# Patient Record
Sex: Male | Born: 1955 | Race: White | Hispanic: No | Marital: Married | Smoking: Current some day smoker
Health system: Southern US, Community
[De-identification: ages and names within clinical notes are randomized; demographics above are authoritative.]

## PROBLEM LIST (undated history)

## (undated) DIAGNOSIS — I319 Disease of pericardium, unspecified: Secondary | ICD-10-CM

## (undated) DIAGNOSIS — I4819 Other persistent atrial fibrillation: Secondary | ICD-10-CM

## (undated) DIAGNOSIS — T8859XA Other complications of anesthesia, initial encounter: Secondary | ICD-10-CM

## (undated) DIAGNOSIS — I499 Cardiac arrhythmia, unspecified: Secondary | ICD-10-CM

## (undated) HISTORY — PX: FOOT SURGERY: SHX648

## (undated) HISTORY — PX: KNEE SURGERY: SHX244

## (undated) HISTORY — PX: BACK SURGERY: SHX140

---
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
## 1999-08-08 ENCOUNTER — Emergency Department (HOSPITAL_COMMUNITY): Admission: EM | Admit: 1999-08-08 | Discharge: 1999-08-08 | Payer: Self-pay | Admitting: Emergency Medicine

## 1999-08-08 ENCOUNTER — Encounter: Payer: Self-pay | Admitting: Emergency Medicine

## 1999-08-09 ENCOUNTER — Encounter: Payer: Self-pay | Admitting: Orthopedic Surgery

## 1999-08-09 ENCOUNTER — Encounter: Admission: RE | Admit: 1999-08-09 | Discharge: 1999-08-09 | Payer: Self-pay | Admitting: Orthopedic Surgery

## 2000-06-06 ENCOUNTER — Encounter: Admission: RE | Admit: 2000-06-06 | Discharge: 2000-06-06 | Payer: Self-pay | Admitting: Emergency Medicine

## 2000-06-06 ENCOUNTER — Encounter: Payer: Self-pay | Admitting: Emergency Medicine

## 2000-06-13 ENCOUNTER — Encounter: Admission: RE | Admit: 2000-06-13 | Discharge: 2000-06-13 | Payer: Self-pay | Admitting: Emergency Medicine

## 2000-06-13 ENCOUNTER — Encounter: Payer: Self-pay | Admitting: Emergency Medicine

## 2000-08-15 ENCOUNTER — Encounter: Payer: Self-pay | Admitting: Neurosurgery

## 2000-08-19 ENCOUNTER — Encounter: Payer: Self-pay | Admitting: Neurosurgery

## 2000-08-19 ENCOUNTER — Inpatient Hospital Stay (HOSPITAL_COMMUNITY): Admission: RE | Admit: 2000-08-19 | Discharge: 2000-08-23 | Payer: Self-pay | Admitting: Neurosurgery

## 2000-10-09 ENCOUNTER — Ambulatory Visit (HOSPITAL_COMMUNITY): Admission: RE | Admit: 2000-10-09 | Discharge: 2000-10-09 | Payer: Self-pay | Admitting: Neurosurgery

## 2000-10-09 ENCOUNTER — Encounter: Payer: Self-pay | Admitting: Neurosurgery

## 2001-02-09 ENCOUNTER — Encounter: Payer: Self-pay | Admitting: Neurosurgery

## 2001-02-09 ENCOUNTER — Ambulatory Visit (HOSPITAL_COMMUNITY): Admission: RE | Admit: 2001-02-09 | Discharge: 2001-02-09 | Payer: Self-pay | Admitting: Neurosurgery

## 2002-03-19 ENCOUNTER — Ambulatory Visit (HOSPITAL_COMMUNITY): Admission: RE | Admit: 2002-03-19 | Discharge: 2002-03-19 | Payer: Self-pay | Admitting: Neurosurgery

## 2002-03-19 ENCOUNTER — Encounter: Payer: Self-pay | Admitting: Neurosurgery

## 2002-04-20 ENCOUNTER — Encounter: Payer: Self-pay | Admitting: Neurosurgery

## 2002-04-20 ENCOUNTER — Encounter: Admission: RE | Admit: 2002-04-20 | Discharge: 2002-04-20 | Payer: Self-pay | Admitting: Neurosurgery

## 2003-05-09 ENCOUNTER — Inpatient Hospital Stay (HOSPITAL_COMMUNITY): Admission: RE | Admit: 2003-05-09 | Discharge: 2003-05-12 | Payer: Self-pay | Admitting: Neurosurgery

## 2003-11-08 ENCOUNTER — Encounter: Admission: RE | Admit: 2003-11-08 | Discharge: 2003-11-08 | Payer: Self-pay | Admitting: Neurosurgery

## 2006-12-10 ENCOUNTER — Emergency Department: Payer: Self-pay | Admitting: Emergency Medicine

## 2006-12-10 ENCOUNTER — Other Ambulatory Visit: Payer: Self-pay

## 2009-06-23 ENCOUNTER — Ambulatory Visit (HOSPITAL_BASED_OUTPATIENT_CLINIC_OR_DEPARTMENT_OTHER): Admission: RE | Admit: 2009-06-23 | Discharge: 2009-06-23 | Payer: Self-pay | Admitting: Urology

## 2010-10-08 LAB — POCT HEMOGLOBIN-HEMACUE: Hemoglobin: 14.5 g/dL (ref 13.0–17.0)

## 2010-11-23 NOTE — Op Note (Signed)
Dylan Olsen, Dylan Olsen                        ACCOUNT NO.:  [id number]   MEDICAL RECORD NO.:  [id number]                   PATIENT TYPE:  INP   LOCATION:  2895                                 FACILITY:  [facility]   PHYSICIAN:  [name], M.D.                     DATE OF BIRTH:  1955-07-26   DATE OF PROCEDURE:  05/09/2003  DATE OF DISCHARGE:                                 OPERATIVE REPORT   PREOPERATIVE DIAGNOSES:  1. Severe degenerative disk disease and lumbar spinal stenosis from central     ruptured disk.  2. Facet arthropathy and degeneration at L5 and S1.   POSTOPERATIVE DIAGNOSIS:  1. Severe degenerative disk disease and lumbar spinal stenosis from central     ruptured disk.  2. Facet arthropathy and degeneration at L5 and S1.   OPERATION PERFORMED:  1. Decompressive lumbar laminectomy and posterior lumbar interbody fusion L5-     S1 using locally harvested autograft and 10 x 24 mm tangent allograft     wedges.  2. Pedicle screw fixation, L4 to S1.  3. Removal of hardware from L4-5 fusion with repositioning of the right L5     screw.  4. Placement of __________  drains.  5. Posterolateral arthrodesis using locally harvested autograft, L5-S1.   SURGEON:  [name], M.D.   ASSISTANT:  Kathaleen Maser. Pool, M.D.   ANESTHESIA:  General endotracheal.   INDICATIONS FOR PROCEDURE:  Patient is a very pleasant, 55 year old  gentleman who has had progressive worsening back pain and right  leg  radiculopathy going on for the past several months.  The patient had had a 4-  5 interbody fusion done over two years ago and did very well.  Over the last  several months, he had progressively worsening and tearing back pain with  right leg radiculopathy radiating down what appeared to be an S1  distribution.  The patient had failed conservative treatment with physical  therapy, steroid injections and pain management.  Repeat preoperative  imaging with MRI and diskography showed severe  degenerative disk disease at  L5-S1 with a large central disk bulge and spinal stenosis as well as  concordant reproduction of the patient's symptomatology with the L5-S1 disk  injection.  In addition, on the CT scan, the diskogram showed the L5 pedicle  screw had broken through the cortex of L5 on the right and was in the soft  tissue.  The patient was recommended extending his fusion down to include L5-  S1 with decompressive laminectomy at this level and repositioning of the  right L5 pedicle screw with a shorter screw.  The risks and benefits of  surgery were explained, and the patient understands and agreed to proceed  forward.   DESCRIPTION OF PROCEDURE:  The patient was brought to the OR.  He was given  general anesthesia.  His back was prepped and draped in the  usual sterile  fashion.  His old incision was opened up and extended slightly inferiorly.  Bovie electrocautery was used to dissect through the subcutaneous tissue and  scar tissue exposing the old construct at L4-5 which was a Science writer 3-D  construct.  Then, the connectors were all removed exposing the distal  pedicle screws at L4 and L5 bilaterally.  Then the S1 lamina was identified  and the facet complex of L5-S1 was identified and dissection was carried  laterally exposing the lateral aspect of the S1 sacral ala.  Then, the self-  retaining retractor was placed.  The scar tissue was dissected off of the L5  lamina and the L5 membrane was identified.  Then, using the 3 and 4 mm  Kerrison punch, a radical decompressive laminectomy and complete medial  facetectomy was performed at L5-S1 with redo foraminotomy at the L5 nerve  root and extension of this distally radically decompressing the L5 neural  foramina bilaterally.  Then, the S1 nerve root was identified and this was  radically decompressed in its foramen.  The undersurface of the facet  complex was underbitten exposing the interspace.  Epidural veins were  coagulated.   Then, the D'Errico nerve root retractor was used to reflect the  right S1 nerve root medially.  Disk space was incised and radically cleaned  out, and a size 10 distractor was inserted, fluoroscopy confirming this to  be the proper size and distraction at this level.  Then the D'Errico nerve  root retractor was used to reflect the S1 nerve root medially.  Upon  reflection, retraction of the S1 nerve root and coagulation of the epidural  veins, the patient was noted to have a episodic period of asystole.  This  immediately came back up into the 50s and 60s.  It was felt to be due to  vagal response and retraction was immediately released.  The patient's heart  rate rebounded. He was given some Robinul and remained stable throughout the  rest of the case.  Then gentle retraction was continued to the S1 nerve root  on the left.  The disk spaces were adequately filmed out and there was a  large central disk rupture that was causing severe spinal stenosis on the  medial aspect of the thecal sac.  This was all scraped down with downgoing  Epstein curet and upbiting pituitaries.  At the end of diskectomy, there was  no further stenosis appreciated on thecal sac or either S1 nerve root.  Then, a size 10 cutter and chisel were used to prepare the interspace to  receive the bone graft.  A 10 x 22 mm  tangent allograft was selected and  inserted on the left approximately 2 mm deep to the posterior vertebral  line.  Then, the right S1 nerve root was reflected medially.  Disk space was  again cleaned out.  Epstein was used to remove any residual central disk  rupture.  Then the size 10 cutter and chisel were used to prepare the end  plates on the right side.  Locally harvested autograft was packed against  the allograft on the left, and a 10 x 22 mm tangent allograft was inserted  on the right side.  Both allografts were noted to be in good position with fluoroscopy and direct inspection.  Then,  attention was taken to the pedicle  screw placement.  First, the right L5 pedicle screw was removed.  This was a  6.5 x 45 pedicle screw.  It was replaced with a 7.5 x 40.  The screw had  excellent purchase.  The pedicle was probed prior to screw placement and  noted to be competent.  __________  orientation; it had broken through deep.  Fluoroscopy again confirmed trajectory and the S1 pedicles were identified.  Pilot hole was drilled with a high speed drill, cannulated with the awl,  tapped with the 5.5 tap and 6.5 x 35 pedicle screw inserted at S1  bilaterally.  Direct intercanalicular inspection confirmed no medial breach.  Fluoroscopy confirmed trajectory, and the pedicle was probed from within and  noted to be competent __________  orientation.  After all pedicle screws  were placed, the lateral facet complexes at L5-S1 and transverse processes  were aggressively decorticated.  The remainder of the locally harvested  allograft was packed into the lateral gutters.  Then, using the side-loading  system of the [facility] 3-D, the 6-mm rod was sized, selected.  The connectors  were attached and this was slid down over the post and attached to the top  of the pedicle screws.  These were all seated in appropriate position.  Facet screws were tightened down to hold it in place.  This was done  bilaterally.  The L5 pedicle screw was tightened down with a torque wrench  and the connector popped off.  The S1 pedicle screw was compressed against  L5 and the L4 pedicle screw was tightened down and torqued off.  The neural  foramina were re-explored.  Both L5 and S1 nerve roots were noted to be  widely decompressed.  No further stenosis, no graft material was within the  canal.  Gelfoam was overlaid atop of the dura.  Postoperative fluoroscopy  confirmed good position of screws and rods and then the medium Hemovac drain  was placed. Muscle and fascia reapproximated with 0 interrupted Vicryl,   subcutaneous tissue closed with 2-0 interrupted Vicryl.  Skin was closed  with running 4-0 subcuticular.  Benzoin and Steri-Strips applied.  The  patient was then transferred to the recovery room in stable condition.  At  the end of the case, sponge, needle and instrument counts were correct.                                               [name], M.D.    GC/MEDQ  D:  05/09/2003  T:  05/09/2003  Job:  [id number]

## 2010-11-23 NOTE — Discharge Summary (Signed)
[name], Dylan                        ACCOUNT NO.:  [id number]   MEDICAL RECORD NO.:  [id number]                   PATIENT TYPE:  INP   LOCATION:  3039                                 FACILITY:  [facility]   PHYSICIAN:  [name], M.D.                     DATE OF BIRTH:  08-21-1955   DATE OF ADMISSION:  05/09/2003  DATE OF DISCHARGE:  05/12/2003                                 DISCHARGE SUMMARY   HOSPITAL COURSE:  The patient is a very pleasant 55 year old gentleman with  chronic low back pain who had undergone previous L4-5 posterior lumbar inner  body fusion two years prior. He basically presented with worsened back pain  and lower extremity radiculopathy. Previously showed severe degeneration and  breakdown of the L5-S1 disk space with large central disk bulge. The patient  was recommended extending his fusion down to include the L5-S1 disk space  for radical decompression. The patient was explained the risks and benefits  of this. He understands and agrees to proceed forward. The patient was  admitted. He went to the operating room and underwent the aforementioned  procedure. Postoperatively, the patient did very well, went to the recovery  room, and then to the floor. On the floor the patient was afebrile on day  one with temperature of 99. The vital signs were stable. He had no leg pain.  He was progressively mobilized. His wound was clean and dry. Physical and  occupational therapy worked with him the couple of days that he was in the  hospital. He did have a lot of abdominal distention and constipation,  however, he was passing gas and this was facilitated with Dulcolax and  laxative of choice. Over the next couple of days the patient mobilized very  well and by hospital day three the patient was stable for discharge home. He  was ambulating and voiding spontaneously. The pain was controlled on pills.  No leg pain. Back soreness but tolerating it. His Hemovac drain which  had  been putting out less than 30 cc in the last day of his hospitalization was  able to be taken out prior to discharge.   DISCHARGE FOLLOWUP:  Discharged to follow up in two weeks.                                                [name], M.D.    GC/MEDQ  D:  06/15/2003  T:  06/15/2003  Job:  [id number]

## 2010-11-23 NOTE — Op Note (Signed)
[place]  Patient:    ADVIT, TRETHEWEY                       MRN: [id number] Proc. Date: 08/19/00 Adm. Date:  [id number] Attending:  [name] P                           Operative Report  PREOPERATIVE DIAGNOSIS:  Discogenic mechanical low back pain with severe spinal stenosis L4-5 from a ruptured disk, and bilateral L5 radiculopathy.  PROCEDURE:  Decompressive laminectomy at L4-5, bilateral foraminotomies at L4 and L5, diskectomy at the L4-5 interspace, Tangent allograft arthrodesis L4-5 using 10 x 20 mm Tangent allografts, posterolateral arthrodesis using autograft harvested during the decompression, pedicle screw placement L4 and L5 using 6.5 x 45 TSRH 3D pedicle screw system.  SURGEON:  Donzetta Sprung. Roney Jaffe., M.D.  FIRST ASSISTANT:  Reinaldo Meeker, M.D.  ANESTHESIA:  General endotracheal.  IV FLUIDS:  2500.  ESTIMATED BLOOD LOSS:  350.  URINE OUTPUT:  250.  CLINICAL HISTORY:  The patient is a very pleasant 55 year old gentleman who has had several months of progressive worsening back and bilateral leg pain that travels down the back of both legs and on top of the feet.  This has affected his left leg greater than his right.  Failed all modalities of conservative treatment, including physical therapy, anti-inflammatories. Preoperative imaging revealed severe spinal stenosis at L4-5.  The patient rated his back pain much greater than his leg pain.  His back pain was mechanical in origin, and it was worse with sitting and standing and walking, much better with sitting down.  With the radiation down both legs, it was felt that he had a significant component of discogenic mechanical low back pain as well as bilateral foraminal stenosis from a ruptured disk and facet arthropathy.  I extensively went over the risks and benefits of surgery with the patient, including but not limited to bleeding and infection, spinal instability, nerve damage,  spinal fluid leakage.  He understood and agreed to proceed.  The patient was set up for surgery.  DESCRIPTION OF PROCEDURE:  The patient was brought in the OR and was induced under general anesthesia, positioned prone on the Wilson frame.  His back was prepped and draped in the usual sterile fashion.  An incision was made centering over the L4, L5, and S1 spinous processes after being infiltrated with 1% lidocaine with epinephrine.  Then Bovie electrocautery carried down through the subcutaneous tissue.  Subperiosteal dissection was carried out along the lamina of L3, L4, L5, and part of S1, out along the lateral aspects of the facet complex, exposing the TPs of L4 and L5 bilaterally. Intraoperative x-ray confirmed the L4 pedicle and a towel clip on the L3 spinous process.  Then the attention was taken to the interspace below this. The spinous process and the lamina of L4 were removed with Leksell rongeurs as well as the 3 and 4 mm Kerrison punch as well as the entire medial facetectomy was removed, and the ligamentum flavum was removed in a piecemeal fashion, exposing the L5 nerve root, and a radical foraminotomy of L5 was continued. Then the L4 nerve root was identified and radical foraminotomies at L4 were continued throughout their foramen.  The thecal sac was noted to have a significant compression from a combination of both ligamentous hypertrophy over the L5 nerve root as well as some facet  arthropathy and a large bulging disk that was compressing both L5 nerve roots on the proximal aspect.  After the L5 foramen had been opened up, then attention was taken to diskectomy, and the DErrico nerve root retractor was used to reflect the L5 nerve root medially.  Epidural veins were coagulated.  The interspace was incised with the 11 blade scalpel.  The remainder of the disk space on the patients right side was cleaned out.  Then an 8 mm distractor was inserted, and subsequently a 10 mm  distractor was inserted.  Then attention was taken to the patients left side.  The DErrico nerve root retractor was used to reflect the L5 nerve root medially.  Epidural veins were coagulated.  The annulotomy was made with an 11 blade scalpel.  This disk space was cleaned out profusely.  Then the 10 mm distractor was inserted here, and then the attention was taken back to the patients right side.  The medium-sized disk space cutter was inserted using fluoroscopy to guide depth, and the remainder of the disk was cut.  Then the chisel was used and inserted on the patients right side, preparing the end plates.  Fluoroscopy confirmed depth of the chisel as well.  Then the remainder of the disk was cleaned out, and a 10 x 20 mm Tangent allograft was inserted in the patients right side.  Then attention was taken back to the left side.  This disk was prepared in a similar fashion.  It was cut with a medium-size cutter and medium-sized #10 chisel.  Then the remainder of the central disk was cleaned out with pituitary rongeurs as well as downgoing Epstein curette.  Autograft was packed against the allograft on the patients right side, and a 10 x 20 mm allograft was inserted in the patients left side, all under compression.  Then Gelfoam was overlaid on top of the dura. Attention was taken to the pedicle screw placement.  The pedicle was identified, and a pilot hole was drilled with the Anspach drill with the blue 8 drill bit.  Using fluoroscopic confirmation of location and trajectory, the pedicle was cannulated, probed, tapped with a 5.5 tap, and a 6.5 x 45 pedicle screw from the [facility] 3D system was inserted at the L4 pedicle on patients left side.  Then attention was taken to the L5 pedicle.  This was prepared in similar fashion.  It was cannulated, probed, tapped, probed, and a 6.5 x 45 screw was inserted there.  Then on the right side, 6.5 x 45 screws were inserted in a similar fashion there.  All  pedicles were noted to be competent in 360 degree orientation after being probed at all steps along the way.  Then fluoroscopy confirmed localization in both the AP and lateral with good  pedicle screw placement.  Then autograft was packed along the lateral gutters. Two regular connectors were inserted for L4, and two offset connectors for l5 were connected to a small curved rod, and these were slid down the posts on top of the pedicle screws.  They were tightened.  The L5 connectors were tightened down and popped off.  Then the L4 connectors were compressed against the L5, tightened up and popped off as well.  At the end of this, the final x-rays were taken showing good position of the construct.  Then meticulous hemostasis was maintained.  Gelfoam was overlaid on top of the dura.  The wound was copiously irrigated.  Fascia was closed with 0 interrupted  Vicryls, skin was closed with 2-0 interrupted Vicryls.  After the fascial closure, the wound was also irrigated again.  Then the skin was closed with a running 4-0 subcuticular, benzoin and Steri-Strips were applied, the wound was dressed. The patient went to the recovery room in stable condition.  At the end of the case, all needle counts, sponge counts were correct. DD:  08/19/00 TD:  08/20/00 Job: [id number]

## 2010-11-23 NOTE — Discharge Summary (Signed)
[place]  Patient:    Dylan Olsen, Dylan Olsen                       MRN: [id number] Adm. Date:  [id number] Disc. Date: [id number] Attending:  Mariam Dollar                           Discharge Summary  DISCHARGE DIAGNOSIS:  Lumbar stenosis, discogenic mechanical low back pain.  PROCEDURES DURING THIS HOSPITALIZATION:  Include decompressive laminectomy, posterior lumbar interbody fusion L4-5, and bilateral L5 and L4 foraminotomies.  HOSPITAL COURSE:  The patient was admitted ______ and went to the operating room and underwent the aforementioned procedure.  Postoperatively, the patient did very well, went to the recovery room and then the floor.  On the floor, he maintained a lot of back pain; however, his leg pain was completely resolved. He was slow to mobilize over the next couple of days.  He spiked a temperature on postoperative day #2 to 101.4, was given incentive spirometry and continued to mobilize with physical therapy.  Pain control was managed with Oxycontin and Percocet.  He did very well with this.  He slowly ambulated.  His incision was well healed.  His drain was able to be taken out on day #2 and by postoperative day #4, he was able to be discharged home.  He was discharged with follow-up in approximately 2 weeks in a lumbar corset. DD:  09/11/00 TD:  09/12/00 Job: [id number]

## 2019-03-10 DIAGNOSIS — L02414 Cutaneous abscess of left upper limb: Secondary | ICD-10-CM | POA: Diagnosis not present

## 2019-03-10 DIAGNOSIS — W57XXXA Bitten or stung by nonvenomous insect and other nonvenomous arthropods, initial encounter: Secondary | ICD-10-CM | POA: Diagnosis not present

## 2019-04-06 DIAGNOSIS — H8309 Labyrinthitis, unspecified ear: Secondary | ICD-10-CM | POA: Diagnosis not present

## 2019-05-22 DIAGNOSIS — J069 Acute upper respiratory infection, unspecified: Secondary | ICD-10-CM | POA: Diagnosis not present

## 2019-05-22 DIAGNOSIS — Z20828 Contact with and (suspected) exposure to other viral communicable diseases: Secondary | ICD-10-CM | POA: Diagnosis not present

## 2019-05-27 DIAGNOSIS — J329 Chronic sinusitis, unspecified: Secondary | ICD-10-CM | POA: Diagnosis not present

## 2019-06-02 DIAGNOSIS — J329 Chronic sinusitis, unspecified: Secondary | ICD-10-CM | POA: Diagnosis not present

## 2019-06-02 DIAGNOSIS — R05 Cough: Secondary | ICD-10-CM | POA: Diagnosis not present

## 2019-11-18 ENCOUNTER — Ambulatory Visit: Payer: Self-pay | Admitting: Family Medicine

## 2020-01-13 ENCOUNTER — Encounter: Payer: Self-pay | Admitting: Family Medicine

## 2020-01-13 ENCOUNTER — Other Ambulatory Visit: Payer: Self-pay

## 2020-01-13 ENCOUNTER — Ambulatory Visit: Payer: BC Managed Care – PPO | Admitting: Family Medicine

## 2020-01-13 VITALS — BP 122/80 | HR 112 | Temp 97.6°F | Ht 70.75 in | Wt 201.5 lb

## 2020-01-13 DIAGNOSIS — I4891 Unspecified atrial fibrillation: Secondary | ICD-10-CM | POA: Diagnosis not present

## 2020-01-13 DIAGNOSIS — R0600 Dyspnea, unspecified: Secondary | ICD-10-CM | POA: Diagnosis not present

## 2020-01-13 DIAGNOSIS — K219 Gastro-esophageal reflux disease without esophagitis: Secondary | ICD-10-CM | POA: Diagnosis not present

## 2020-01-13 DIAGNOSIS — G8929 Other chronic pain: Secondary | ICD-10-CM | POA: Insufficient documentation

## 2020-01-13 DIAGNOSIS — M25512 Pain in left shoulder: Secondary | ICD-10-CM

## 2020-01-13 DIAGNOSIS — H8110 Benign paroxysmal vertigo, unspecified ear: Secondary | ICD-10-CM | POA: Diagnosis not present

## 2020-01-13 DIAGNOSIS — R000 Tachycardia, unspecified: Secondary | ICD-10-CM | POA: Diagnosis not present

## 2020-01-13 DIAGNOSIS — R0609 Other forms of dyspnea: Secondary | ICD-10-CM | POA: Insufficient documentation

## 2020-01-13 DIAGNOSIS — Z1211 Encounter for screening for malignant neoplasm of colon: Secondary | ICD-10-CM

## 2020-01-13 DIAGNOSIS — J302 Other seasonal allergic rhinitis: Secondary | ICD-10-CM | POA: Insufficient documentation

## 2020-01-13 DIAGNOSIS — N529 Male erectile dysfunction, unspecified: Secondary | ICD-10-CM | POA: Insufficient documentation

## 2020-01-13 DIAGNOSIS — I4819 Other persistent atrial fibrillation: Secondary | ICD-10-CM | POA: Insufficient documentation

## 2020-01-13 NOTE — Assessment & Plan Note (Signed)
Etiology unclear - notes hx since having a severe cold Sept 2020. EKG with atrial fibrillation which could be contributing. Will get BNP and echo given new diagnosis of Afib. Continue to monitor symptoms

## 2020-01-13 NOTE — Assessment & Plan Note (Signed)
Given DOE and new diagnosis of afib discussed deferring treatment until further work-up. Labs and echo ordered today

## 2020-01-13 NOTE — Assessment & Plan Note (Signed)
New diagnosis. CHA2DSVASC 0. Discussed option of metoprolol - pt feels more anxious today and no symptoms of palpitations. He will do home HR monitoring and report back. ECHO to further evaluated. Discussed possible link with SOB and could consider stress test in the future to rule out possibility. Return after ECHO

## 2020-01-13 NOTE — Assessment & Plan Note (Signed)
Stable  Con't pepcid  

## 2020-01-13 NOTE — Assessment & Plan Note (Signed)
Controlled with prn meclizine

## 2020-01-13 NOTE — Assessment & Plan Note (Addendum)
Suspect chest/back pain is not cardiac given normal EKG and pain reproduced with muscle strain. Suspect muscle related. Recommend course of PT - pt declined at this time.

## 2020-01-13 NOTE — Assessment & Plan Note (Signed)
Cont zyrtec 

## 2020-01-13 NOTE — Progress Notes (Signed)
Subjective:     Dylan Olsen is a 64 y.o. male presenting for Establish Care     HPI   #Chest pain - radiates to the back - on the left peck - hx of left shoulder pain - comes and goes - will come on with movement of the left arm  - will get pain in the posterior shoulder blade - symptoms x 10 years  #Knee swelling - on the left knee - present for years - occasional pain  #chronic sob - since getting sick back in September - was negative for covid - will get more SOB when walking up to check the mail - no issues laying down at night - has had to have the ambulance come due to waking up gasping for air -- has been >1 year  #ED - 2 years - with all erections - will get partial erection - but not fully   Review of Systems   Social History   Tobacco Use  Smoking Status Current Every Day Smoker  . Types: Cigars  Smokeless Tobacco Never Used  Tobacco Comment   a few times a year        Objective:    BP Readings from Last 3 Encounters:  01/13/20 122/80   Wt Readings from Last 3 Encounters:  01/13/20 201 lb 8 oz (91.4 kg)    BP 122/80   Pulse (!) 112   Temp 97.6 F (36.4 C) (Temporal)   Ht 5' 10.75" (1.797 m)   Wt 201 lb 8 oz (91.4 kg)   SpO2 98%   BMI 28.30 kg/m    Physical Exam Constitutional:      Appearance: Normal appearance. He is not ill-appearing or diaphoretic.  HENT:     Right Ear: External ear normal.     Left Ear: External ear normal.     Nose: Nose normal.  Eyes:     General: No scleral icterus.    Extraocular Movements: Extraocular movements intact.     Conjunctiva/sclera: Conjunctivae normal.  Cardiovascular:     Rate and Rhythm: Regular rhythm. Tachycardia present.     Heart sounds: No murmur heard.   Pulmonary:     Effort: Pulmonary effort is normal. No respiratory distress.     Breath sounds: Normal breath sounds. No wheezing or rales.  Chest:     Chest wall: No tenderness.  Musculoskeletal:     Cervical  back: Neck supple.     Comments: Chest/shoulder/back pain - reproduced with strain on Pectoralis muscles  Skin:    General: Skin is warm and dry.  Neurological:     Mental Status: He is alert. Mental status is at baseline.  Psychiatric:        Mood and Affect: Mood normal.     EKG: afib rate 108. No st changes, no twave abnormalities      Assessment & Plan:   Problem List Items Addressed This Visit      Cardiovascular and Mediastinum   Atrial fibrillation (HCC) - Primary    New diagnosis. CHA2DSVASC 0. Discussed option of metoprolol - pt feels more anxious today and no symptoms of palpitations. He will do home HR monitoring and report back. ECHO to further evaluated. Discussed possible link with SOB and could consider stress test in the future to rule out possibility. Return after ECHO      Relevant Orders   ECHOCARDIOGRAM COMPLETE     Digestive   GERD (gastroesophageal reflux disease)  Stable. Cont pepcid      Relevant Medications   famotidine (PEPCID) 40 MG tablet   Meclizine HCl (BONINE PO)     Nervous and Auditory   BPPV (benign paroxysmal positional vertigo)    Controlled with prn meclizine        Other   Seasonal allergies    Cont zyrtec      Chronic left shoulder pain    Suspect chest/back pain is not cardiac given normal EKG and pain reproduced with muscle strain. Suspect muscle related. Recommend course of PT - pt declined at this time.       Dyspnea on exertion    Etiology unclear - notes hx since having a severe cold Sept 2020. EKG with atrial fibrillation which could be contributing. Will get BNP and echo given new diagnosis of Afib. Continue to monitor symptoms      Relevant Orders   Brain natriuretic peptide   Erectile dysfunction    Given DOE and new diagnosis of afib discussed deferring treatment until further work-up. Labs and echo ordered today      Relevant Orders   Comprehensive metabolic panel   Lipid panel   Hemoglobin A1c    Tachycardia   Relevant Orders   Lipid panel   EKG 12-Lead (Completed)   Brain natriuretic peptide    Other Visit Diagnoses    Screening for colon cancer       Relevant Orders   Fecal occult blood, imunochemical       Return for after echo.  Lynnda Child, MD  This visit occurred during the SARS-CoV-2 public health emergency.  Safety protocols were in place, including screening questions prior to the visit, additional usage of staff PPE, and extensive cleaning of exam room while observing appropriate contact time as indicated for disinfecting solutions.

## 2020-01-14 LAB — HEMOGLOBIN A1C: Hgb A1c MFr Bld: 5.3 % (ref 4.6–6.5)

## 2020-01-14 LAB — COMPREHENSIVE METABOLIC PANEL
ALT: 33 U/L (ref 0–53)
AST: 22 U/L (ref 0–37)
Albumin: 4.6 g/dL (ref 3.5–5.2)
Alkaline Phosphatase: 88 U/L (ref 39–117)
BUN: 11 mg/dL (ref 6–23)
CO2: 29 mEq/L (ref 19–32)
Calcium: 9.2 mg/dL (ref 8.4–10.5)
Chloride: 102 mEq/L (ref 96–112)
Creatinine, Ser: 0.85 mg/dL (ref 0.40–1.50)
GFR: 90.71 mL/min (ref >60.00)
Glucose, Bld: 97 mg/dL (ref 70–99)
Potassium: 4 mEq/L (ref 3.5–5.1)
Sodium: 139 mEq/L (ref 135–145)
Total Bilirubin: 0.7 mg/dL (ref 0.2–1.2)
Total Protein: 7 g/dL (ref 6.0–8.3)

## 2020-01-14 LAB — LDL CHOLESTEROL, DIRECT: Direct LDL: 105 mg/dL

## 2020-01-14 LAB — LIPID PANEL
Cholesterol: 215 mg/dL — ABNORMAL HIGH (ref 0–200)
HDL: 35.3 mg/dL — ABNORMAL LOW (ref >39.00)
Total CHOL/HDL Ratio: 6
Triglycerides: 430 mg/dL — ABNORMAL HIGH (ref 0.0–149.0)

## 2020-01-14 LAB — BRAIN NATRIURETIC PEPTIDE: Pro B Natriuretic peptide (BNP): 66 pg/mL (ref 0.0–100.0)

## 2020-01-17 ENCOUNTER — Telehealth: Payer: Self-pay

## 2020-01-17 ENCOUNTER — Other Ambulatory Visit: Payer: Self-pay | Admitting: Family Medicine

## 2020-01-17 DIAGNOSIS — E782 Mixed hyperlipidemia: Secondary | ICD-10-CM

## 2020-01-17 MED ORDER — ATORVASTATIN CALCIUM 10 MG PO TABS: 10.0000 mg | ORAL_TABLET | Freq: Every day | ORAL | 3 refills | Status: DC

## 2020-01-17 NOTE — Telephone Encounter (Signed)
Spoke to pt's wife, [name], ([place]) and relayed his blood work results and new medication sent into pharmacy. Also, relayed Dr' Cody's suggestion to take a baby aspirin daily. She understood and will pass this info along to pt.

## 2020-02-17 ENCOUNTER — Other Ambulatory Visit: Payer: Self-pay

## 2020-02-17 ENCOUNTER — Ambulatory Visit (INDEPENDENT_AMBULATORY_CARE_PROVIDER_SITE_OTHER): Payer: BC Managed Care – PPO

## 2020-02-17 DIAGNOSIS — I4891 Unspecified atrial fibrillation: Secondary | ICD-10-CM | POA: Diagnosis not present

## 2020-02-17 NOTE — Progress Notes (Unsigned)
°   Patient ID: Dylan Olsen, male    DOB: [birth year], 64 y.o.   MRN: [id number]  During echocardiogram today the patient showed atrial fibrillation with RVR. His only symptom was mild dyspnea with exertion. Brought to Dr Windell Hummingbird attention and the patient was released with his permission.      Review of Systems    Physical Exam

## 2020-02-18 LAB — ECHOCARDIOGRAM COMPLETE
AR max vel: 3.38 cm2
AV Area VTI: 3.37 cm2
AV Area mean vel: 3.01 cm2
AV Mean grad: 3.7 mmHg
AV Peak grad: 6.4 mmHg
Ao pk vel: 1.27 m/s
Calc EF: 46.7 %
S' Lateral: 3.3 cm
Single Plane A2C EF: 49.9 %
Single Plane A4C EF: 42.7 %

## 2020-02-19 ENCOUNTER — Telehealth: Payer: Self-pay | Admitting: Family Medicine

## 2020-02-19 DIAGNOSIS — I4891 Unspecified atrial fibrillation: Secondary | ICD-10-CM

## 2020-02-19 NOTE — Telephone Encounter (Signed)
Please call patient to let him know his ECHO results  His heart rate was fast and irregular. This will need medication.   His heart also showed that it was not pumping as well as it should. This is likely due to the heart rate.   Given that he has both I think meeting with Cardiology would be a good first step and have placed a referral.   If he prefers not see Cardiology please have him schedule with me ASAP to discuss treatment. If he feels his breathing is worsening have him schedule an appointment with me - unless he is able to be seen earlier.

## 2020-02-22 NOTE — Telephone Encounter (Signed)
Called pt and left VM (DPR) relaying ECHO information, per Dr. Selena Batten.

## 2020-02-24 NOTE — Telephone Encounter (Signed)
Pt's wife said she will call and schedule a visit with cardiologist.

## 2020-03-05 NOTE — Progress Notes (Signed)
Electrophysiology Office Note:    Date:  03/06/2020   ID:  Dylan, Olsen 1955/11/12, MRN [id number]  PCP:  Lynnda Child, MD  [facility] Cardiologist:  No primary care provider on file.  [facility] Electrophysiologist:  [name], MD   Referring MD: Lynnda Child, MD   Chief Complaint: Atrial fibrillation  History of Present Illness:    Dylan Olsen is a 64 y.o. male with a hx of vertigo, ED and GERD who presents to the clinic for evaluation of a new diagnosis of atrial fibrillation. He had a recent echo which showed depressed EF with global LV dysfunction. His AF is symptomatic with dyspnea on exertion. Wife reports that he was very active previously and now he is winded after going to the mailbox. He is not on [facility] given he prefers to be off medications and a low CHADSVASc. Works with [employer] for [employer].  History reviewed. No pertinent past medical history.  Past Surgical History:  Procedure Laterality Date  . BACK SURGERY    . FOOT SURGERY    . KNEE SURGERY Left     Current Medications: Current Meds  Medication Sig  . atorvastatin (LIPITOR) 10 MG tablet Take 1 tablet (10 mg total) by mouth daily.  . cetirizine (ZYRTEC) 10 MG tablet Take 10 mg by mouth daily.  . famotidine (PEPCID) 40 MG tablet Take 40 mg by mouth daily.  . Meclizine HCl (BONINE PO) Take by mouth as needed. Take as needed for vertigo     Allergies:   Patient has no allergy information on record.   Social History   Socioeconomic History  . Marital status: Married    Spouse name: Dylan Olsen  . Number of children: 2  . Years of education: high school  . Highest education level: Not on file  Occupational History  . Occupation: Curator  Tobacco Use  . Smoking status: Current Every Day Smoker    Types: Cigars  . Smokeless tobacco: Never Used  . Tobacco comment: a few times a year  Vaping Use  . Vaping Use: Never used  Substance and Sexual Activity  .  Alcohol use: Yes    Comment: less than monthly  . Drug use: Yes    Types: Marijuana    Comment: a few times a week  . Sexual activity: Yes    Birth control/protection: Post-menopausal  Other Topics Concern  . Not on file  Social History Narrative   01/13/20   From: [place], [place] originally - moved to [facility] 1996   Living: with wife [name] (1996), and step daughter (special needs)   Work: English as a second language teacher for [employer]      Family: 2 children - Dylan Olsen and Dylan Olsen - in [place], good relationship and 3 grandchildren and 2 step children      Enjoys: drag racing, football      Exercise: not currently - walking and mobile job   Diet: not great, sometimes a little better      Safety   Seat belts: Yes    Guns: Yes  and secure   Safe in relationships: Yes    Social Determinants of Health   Financial Resource Strain:   . Difficulty of Paying Living Expenses: Not on file  Food Insecurity:   . Worried About Programme researcher, broadcasting/film/video in the Last Year: Not on file  . Ran Out of Food in the Last Year: Not on file  Transportation Needs:   .  Lack of Transportation (Medical): Not on file  . Lack of Transportation (Non-Medical): Not on file  Physical Activity:   . Days of Exercise per Week: Not on file  . Minutes of Exercise per Session: Not on file  Stress:   . Feeling of Stress : Not on file  Social Connections:   . Frequency of Communication with Friends and Family: Not on file  . Frequency of Social Gatherings with Friends and Family: Not on file  . Attends Religious Services: Not on file  . Active Member of Clubs or Organizations: Not on file  . Attends Banker Meetings: Not on file  . Marital Status: Not on file     Family History: The patient's family history includes Cancer in his cousin; Heart attack (age of onset: 72) in his father; Heart disease in his father.  ROS:   Please see the history of present illness.    All other systems reviewed and are  negative.  EKGs/Labs/Other Studies Reviewed:    The following studies were reviewed today: Echo, ECG  02/17/2020 Echo 1. Rhythm concerning for atrial fibrillation  2. Left ventricular ejection fraction, by estimation, is 40 to 45%. The  left ventricle has mildly decreased function. The left ventricle  demonstrates global hypokinesis. Left ventricular diastolic parameters are  indeterminate.  3. Right ventricular systolic function is normal. The right ventricular  size is normal. There is normal pulmonary artery systolic pressure. The  estimated right ventricular systolic pressure is 26.0 mmHg.  4. Left atrial size was mildly dilated.  5. Mild mitral valve regurgitation.   01/13/2020 ECG Atrial fibrillation with ventricular rate 108. RBBB in V1.    EKG:  The ekg ordered today demonstrates atrial fibrillation with RVR  Recent Labs: 01/13/2020: ALT 33; BUN 11; Creatinine, Ser 0.85; Potassium 4.0; Pro B Natriuretic peptide (BNP) 66.0; Sodium 139  Recent Lipid Panel    Component Value Date/Time   CHOL 215 (H) 01/13/2020 1632   TRIG (H) 01/13/2020 1632    430.0 Triglyceride is over 400; calculations on Lipids are invalid.   HDL 35.30 (L) 01/13/2020 1632   CHOLHDL 6 01/13/2020 1632   LDLDIRECT 105.0 01/13/2020 1632    Physical Exam:    VS:  BP 138/74   Pulse (!) 111   Ht 5' 10.75" (1.797 m)   Wt 203 lb 3.2 oz (92.2 kg)   SpO2 98%   BMI 28.54 kg/m     Wt Readings from Last 3 Encounters:  03/06/20 203 lb 3.2 oz (92.2 kg)  01/13/20 201 lb 8 oz (91.4 kg)     GEN:  Well nourished, well developed in no acute distress HEENT: Normal NECK: No JVD; No carotid bruits LYMPHATICS: No lymphadenopathy CARDIAC: irregularly irregular, no murmurs, rubs, gallops RESPIRATORY:  Clear to auscultation without rales, wheezing or rhonchi  ABDOMEN: Soft, non-tender, non-distended MUSCULOSKELETAL:  No edema; No deformity  SKIN: Warm and dry NEUROLOGIC:  Alert and oriented x 3 PSYCHIATRIC:   Normal affect   ASSESSMENT:    1. Persistent atrial fibrillation (HCC)    PLAN:    In order of problems listed above:  1. Persistent atrial fibrillation, symptomatic He has new onset AF which is symptomatic and contributing to a decreased LV function. Would favor a rhythm control strategy. Discussed options including pharmacologic therapy and ablation. For pharmacologic, could go with amio or dofetilide. Unfortunately with Covid, we are not doing elective admissions for dofetilide.   Patient thinks he will pursue ablation but he  would like to take a few days to think about it. Discussed risks, benefits and alternatives to ablation in depth with the patient and his wife. They will reach out. If they decide to pursue ablation, will need to start apixaban for at least 3 weeks prior to ablation and then continue post op. His CHADSVASc is 1 for his decreased EF. He would need a preop CT if we move forward with ablation. Will need to discuss sleep study referral at next appt.     Medication Adjustments/Labs and Tests Ordered: Current medicines are reviewed at length with the patient today.  Concerns regarding medicines are outlined above.  Orders Placed This Encounter  Procedures  . EKG 12-Lead   No orders of the defined types were placed in this encounter.   Patient Instructions  Medication Instructions:  Your physician recommends that you continue on your current medications as directed. Please refer to the Current Medication list given to you today.  Labwork: None ordered.  Testing/Procedures: None ordered.  Follow-Up:   Let me know what you decide, and we can go forward with a plan for your care!   Any Other Special Instructions Will Be Listed Below (If Applicable).  If you need a refill on your cardiac medications before your next appointment, please call your pharmacy.      Signed, [name], MD  03/06/2020 4:24 PM    [facility]

## 2020-03-06 ENCOUNTER — Encounter: Payer: Self-pay | Admitting: Cardiology

## 2020-03-06 ENCOUNTER — Other Ambulatory Visit: Payer: Self-pay

## 2020-03-06 ENCOUNTER — Ambulatory Visit: Payer: BC Managed Care – PPO | Admitting: Cardiology

## 2020-03-06 VITALS — BP 138/74 | HR 111 | Ht 70.75 in | Wt 203.2 lb

## 2020-03-06 DIAGNOSIS — I4819 Other persistent atrial fibrillation: Secondary | ICD-10-CM | POA: Diagnosis not present

## 2020-03-06 NOTE — Patient Instructions (Addendum)
Medication Instructions:  Your physician recommends that you continue on your current medications as directed. Please refer to the Current Medication list given to you today.  Labwork: None ordered.  Testing/Procedures: None ordered.  Follow-Up:   Let me know what you decide, and we can go forward with a plan for your care!   Any Other Special Instructions Will Be Listed Below (If Applicable).  If you need a refill on your cardiac medications before your next appointment, please call your pharmacy.

## 2020-03-08 ENCOUNTER — Telehealth: Payer: Self-pay | Admitting: Cardiology

## 2020-03-08 DIAGNOSIS — I4891 Unspecified atrial fibrillation: Secondary | ICD-10-CM

## 2020-03-08 NOTE — Telephone Encounter (Signed)
Patient's wife is requesting to schedule a procedure to correct afib, as discussed during appointment on 03/06/20. Please call.

## 2020-03-09 ENCOUNTER — Telehealth: Payer: Self-pay | Admitting: Cardiology

## 2020-03-09 NOTE — Telephone Encounter (Signed)
Call returned to Pt's wife.  Pt would like to proceed with ablation.  Gave upcoming available dates in October.  Advised would call tomorrow morning to discuss.

## 2020-03-09 NOTE — Telephone Encounter (Signed)
Patient wife is returning phone call

## 2020-03-09 NOTE — Telephone Encounter (Signed)
Duplicate message.  See message received 03/08/20

## 2020-03-10 MED ORDER — APIXABAN 5 MG PO TABS: 5.0000 mg | ORAL_TABLET | Freq: Two times a day (BID) | ORAL | 11 refills | Status: DC

## 2020-03-10 MED ORDER — METOPROLOL TARTRATE 100 MG PO TABS: ORAL_TABLET | ORAL | 0 refills | Status: DC

## 2020-03-10 NOTE — Telephone Encounter (Signed)
Pt is scheduled for April 07, 2020 for afib ablation  Labs/covid test scheduled  Will meet with family 9/7 to discuss instructions  Pt will start Eliquis, samples left at front desk for pick up  Work up complete

## 2020-03-14 ENCOUNTER — Other Ambulatory Visit: Payer: Self-pay

## 2020-03-14 ENCOUNTER — Other Ambulatory Visit: Payer: BC Managed Care – PPO | Admitting: *Deleted

## 2020-03-14 DIAGNOSIS — I4891 Unspecified atrial fibrillation: Secondary | ICD-10-CM

## 2020-03-15 LAB — BASIC METABOLIC PANEL
BUN/Creatinine Ratio: 12 (ref 10–24)
BUN: 10 mg/dL (ref 8–27)
CO2: 25 mmol/L (ref 20–29)
Calcium: 9.5 mg/dL (ref 8.6–10.2)
Chloride: 104 mmol/L (ref 96–106)
Creatinine, Ser: 0.84 mg/dL (ref 0.76–1.27)
GFR calc Af Amer: 107 mL/min/{1.73_m2} (ref >59)
GFR calc non Af Amer: 93 mL/min/{1.73_m2} (ref >59)
Glucose: 93 mg/dL (ref 65–99)
Potassium: 3.8 mmol/L (ref 3.5–5.2)
Sodium: 146 mmol/L — ABNORMAL HIGH (ref 134–144)

## 2020-03-15 LAB — CBC WITH DIFFERENTIAL/PLATELET
Basophils Absolute: 0.1 10*3/uL (ref 0.0–0.2)
Basos: 1 %
EOS (ABSOLUTE): 0.1 10*3/uL (ref 0.0–0.4)
Eos: 2 %
Hematocrit: 44 % (ref 37.5–51.0)
Hemoglobin: 15.3 g/dL (ref 13.0–17.7)
Immature Grans (Abs): 0.1 10*3/uL (ref 0.0–0.1)
Immature Granulocytes: 1 %
Lymphocytes Absolute: 2 10*3/uL (ref 0.7–3.1)
Lymphs: 23 %
MCH: 31.8 pg (ref 26.6–33.0)
MCHC: 34.8 g/dL (ref 31.5–35.7)
MCV: 92 fL (ref 79–97)
Monocytes Absolute: 0.8 10*3/uL (ref 0.1–0.9)
Monocytes: 9 %
Neutrophils Absolute: 5.5 10*3/uL (ref 1.4–7.0)
Neutrophils: 64 %
Platelets: 247 10*3/uL (ref 150–450)
RBC: 4.81 x10E6/uL (ref 4.14–5.80)
RDW: 12.7 % (ref 11.6–15.4)
WBC: 8.5 10*3/uL (ref 3.4–10.8)

## 2020-03-15 LAB — SPECIMEN STATUS REPORT

## 2020-03-24 ENCOUNTER — Telehealth: Payer: Self-pay | Admitting: Cardiology

## 2020-03-24 MED ORDER — METOPROLOL TARTRATE 100 MG PO TABS
ORAL_TABLET | ORAL | 0 refills | Status: DC
Start: 2020-03-24 — End: 2020-04-07

## 2020-03-24 MED ORDER — APIXABAN 5 MG PO TABS
5.0000 mg | ORAL_TABLET | Freq: Two times a day (BID) | ORAL | 5 refills | Status: DC
Start: 2020-03-24 — End: 2020-03-24

## 2020-03-24 MED ORDER — APIXABAN 5 MG PO TABS
5.0000 mg | ORAL_TABLET | Freq: Two times a day (BID) | ORAL | 5 refills | Status: DC
Start: 2020-03-24 — End: 2020-10-23

## 2020-03-24 NOTE — Telephone Encounter (Signed)
Prescription refill request for Eliquis received.  Last office visit: 8/30/2021[name] Scr: 0.84, 03/14/2020  Age: 64 y.o. Weight: 92.3 kg   Prescription refill sent.

## 2020-03-24 NOTE — Addendum Note (Signed)
Addended by: Roney Mans A on: 03/24/2020 01:59 PM   Modules accepted: Orders

## 2020-03-24 NOTE — Telephone Encounter (Signed)
Pt c/o medication issue:  1. Name of Medication:  apixaban (ELIQUIS) 5 MG TABS tablet metoprolol tartrate (LOPRESSOR) 100 MG tablet  2. How are you currently taking this medication (dosage and times per day)?  Metoprolol 1 tablet prior to CT  3. Are you having a reaction (difficulty breathing--STAT)? no  4. What is your medication issue? Patient's wife states the medications were not received by the pharmacy and he needs them for his CT.

## 2020-03-24 NOTE — Telephone Encounter (Signed)
Called pharmacy who now has the refills for the Eliquis and lopressor on file.

## 2020-03-29 ENCOUNTER — Telehealth (HOSPITAL_COMMUNITY): Payer: Self-pay | Admitting: Emergency Medicine

## 2020-03-29 NOTE — Telephone Encounter (Signed)
Attempted to call patient regarding upcoming cardiac CT appointment. °Left message on voicemail with name and callback number °Jawad Wiacek RN Navigator Cardiac Imaging °[facility] °[phone] Office °[phone] Cell ° °

## 2020-03-29 NOTE — Telephone Encounter (Signed)
Pt returning phone call regarding upcoming cardiac imaging study; pt verbalizes understanding of appt date/time, parking situation and where to check in, pre-test NPO status and medications ordered, and verified current allergies; name and call back number provided for further questions should they arise Micky Sheller RN Navigator Cardiac Imaging [facility] [phone] office [phone] cell   

## 2020-03-30 ENCOUNTER — Telehealth: Payer: Self-pay | Admitting: Cardiology

## 2020-03-30 NOTE — Telephone Encounter (Signed)
  Pt c/o medication issue:  1. Name of Medication: apixaban (ELIQUIS) 5 MG TABS tablet  2. How are you currently taking this medication (dosage and times per day)?  3. Are you having a reaction (difficulty breathing--STAT)?  4. What is your medication issue? Patient has only been taking 1 tablet daily, because it wasn't explained to him he was to take two daily.  Patient's wife wants to know exactly how much he is to take.

## 2020-03-30 NOTE — Telephone Encounter (Signed)
Returned call to family.  Advised we needed to cancel afib ablation  Pt started taking Eliquis correctly 9/23  Advised wife would cancel all procedures and reschedule on Tuesday

## 2020-03-31 ENCOUNTER — Ambulatory Visit (HOSPITAL_COMMUNITY): Payer: BC Managed Care – PPO

## 2020-03-31 NOTE — Telephone Encounter (Signed)
Spoke to family wife yesterday evening and informed that per Dr. [name] we could proceed w/ procedure next Friday w/ a TEE day before.  She/pt was agreeable to plan.  Called back today to review instructions. Covid screening scheduled for 9/28. TEE 9/30, aware to arrive to [facility] at 7:30 am, NPO after MN. Aware ablation instructions for 10/1 remain the same.  Wife verbalized understanding and agreeable to plan.

## 2020-04-04 ENCOUNTER — Other Ambulatory Visit (HOSPITAL_COMMUNITY)
Admission: RE | Admit: 2020-04-04 | Discharge: 2020-04-04 | Disposition: A | Discharge status: Home / Self Care | Payer: 59 | Source: Ambulatory Visit | Attending: Internal Medicine | Admitting: Internal Medicine

## 2020-04-04 DIAGNOSIS — Z01812 Encounter for preprocedural laboratory examination: Secondary | ICD-10-CM | POA: Insufficient documentation

## 2020-04-04 DIAGNOSIS — Z20822 Contact with and (suspected) exposure to covid-19: Secondary | ICD-10-CM | POA: Diagnosis not present

## 2020-04-04 LAB — SARS CORONAVIRUS 2 (TAT 6-24 HRS): SARS Coronavirus 2: NEGATIVE

## 2020-04-05 ENCOUNTER — Other Ambulatory Visit (HOSPITAL_COMMUNITY): Payer: BC Managed Care – PPO

## 2020-04-06 ENCOUNTER — Other Ambulatory Visit: Payer: Self-pay

## 2020-04-06 ENCOUNTER — Encounter (HOSPITAL_COMMUNITY)
Admission: RE | Disposition: A | Discharge status: Home / Self Care | Payer: Self-pay | Source: Home / Self Care | Attending: Internal Medicine

## 2020-04-06 ENCOUNTER — Ambulatory Visit (HOSPITAL_COMMUNITY)
Admission: RE | Admit: 2020-04-06 | Discharge: 2020-04-06 | Disposition: A | Discharge status: Home / Self Care | Payer: 59 | Attending: Internal Medicine | Admitting: Internal Medicine

## 2020-04-06 ENCOUNTER — Ambulatory Visit (HOSPITAL_COMMUNITY): Payer: 59 | Admitting: Certified Registered"

## 2020-04-06 ENCOUNTER — Encounter (HOSPITAL_COMMUNITY): Payer: Self-pay | Admitting: Internal Medicine

## 2020-04-06 ENCOUNTER — Ambulatory Visit (HOSPITAL_BASED_OUTPATIENT_CLINIC_OR_DEPARTMENT_OTHER)
Admission: RE | Admit: 2020-04-06 | Discharge: 2020-04-06 | Disposition: A | Discharge status: Home / Self Care | Payer: 59 | Source: Ambulatory Visit | Attending: Internal Medicine | Admitting: Internal Medicine

## 2020-04-06 DIAGNOSIS — I4891 Unspecified atrial fibrillation: Secondary | ICD-10-CM

## 2020-04-06 DIAGNOSIS — Z79899 Other long term (current) drug therapy: Secondary | ICD-10-CM | POA: Insufficient documentation

## 2020-04-06 DIAGNOSIS — I480 Paroxysmal atrial fibrillation: Secondary | ICD-10-CM | POA: Insufficient documentation

## 2020-04-06 DIAGNOSIS — Z7901 Long term (current) use of anticoagulants: Secondary | ICD-10-CM | POA: Diagnosis not present

## 2020-04-06 DIAGNOSIS — K219 Gastro-esophageal reflux disease without esophagitis: Secondary | ICD-10-CM | POA: Diagnosis not present

## 2020-04-06 DIAGNOSIS — I319 Disease of pericardium, unspecified: Secondary | ICD-10-CM | POA: Insufficient documentation

## 2020-04-06 DIAGNOSIS — R778 Other specified abnormalities of plasma proteins: Secondary | ICD-10-CM | POA: Diagnosis not present

## 2020-04-06 DIAGNOSIS — I517 Cardiomegaly: Secondary | ICD-10-CM | POA: Diagnosis not present

## 2020-04-06 HISTORY — PX: TEE WITHOUT CARDIOVERSION: SHX5443

## 2020-04-06 HISTORY — PX: BUBBLE STUDY: SHX6837

## 2020-04-06 HISTORY — DX: Other complications of anesthesia, initial encounter: T88.59XA

## 2020-04-06 SURGERY — ECHOCARDIOGRAM, TRANSESOPHAGEAL
Anesthesia: Monitor Anesthesia Care

## 2020-04-06 MED ORDER — BUTAMBEN-TETRACAINE-BENZOCAINE 2-2-14 % EX AERO
INHALATION_SPRAY | CUTANEOUS | Status: DC | PRN
  Administered 2020-04-06: 2 via TOPICAL

## 2020-04-06 MED ORDER — SODIUM CHLORIDE 0.9 % IV SOLN: INTRAVENOUS | Status: DC | PRN

## 2020-04-06 MED ORDER — PROPOFOL 10 MG/ML IV BOLUS
INTRAVENOUS | Status: DC | PRN
  Administered 2020-04-06 (×2): 20 mg via INTRAVENOUS

## 2020-04-06 MED ORDER — PROPOFOL 500 MG/50ML IV EMUL
INTRAVENOUS | Status: DC | PRN
  Administered 2020-04-06: 150 ug/kg/min via INTRAVENOUS

## 2020-04-06 MED ORDER — LIDOCAINE HCL (CARDIAC) PF 100 MG/5ML IV SOSY
PREFILLED_SYRINGE | INTRAVENOUS | Status: DC | PRN
  Administered 2020-04-06: 40 mg via INTRAVENOUS

## 2020-04-06 MED ORDER — SODIUM CHLORIDE 0.9 % IV SOLN: INTRAVENOUS | Status: DC

## 2020-04-06 NOTE — CV Procedure (Signed)
    TRANSESOPHAGEAL ECHOCARDIOGRAM   NAME:  Dylan Olsen    MRN: [id number] DOB:  1956/03/10    ADMIT DATE: 04/06/2020  INDICATIONS: Atrial Fibrillation  PROCEDURE:   Informed consent was obtained prior to the procedure. The risks, benefits and alternatives for the procedure were discussed and the patient comprehended these risks.  Risks include, but are not limited to, cough, sore throat, vomiting, nausea, somnolence, esophageal and stomach trauma or perforation, bleeding, low blood pressure, aspiration, pneumonia, infection, trauma to the teeth and death.    Procedural time out performed. The oropharynx was anesthetized with topical 1% cetacaine.    Anesthesia was administered by Haematologist.  The patient was administered a total of 360 mg propofol to achieve and maintain moderate conscious sedation.  The patient's heart rate, blood pressure, and oxygen saturation are monitored continuously during the procedure. The period of conscious sedation is 25 minutes, of which I was present face-to-face 100% of this time.   The transesophageal probe was inserted in the esophagus and stomach without difficulty and multiple views were obtained.   COMPLICATIONS:    There were no immediate complications.  KEY FINDINGS:  1. No left atrial appendage thrombus 2. Evidence of patent foramen ovale with L-R shunt 3. Full report to follow. 4. Further management per primary team.   Riley Lam, MD [place]  [facility]  9:08 AM

## 2020-04-06 NOTE — Anesthesia Postprocedure Evaluation (Signed)
Anesthesia Post Note  Patient: Dylan Olsen  Procedure(s) Performed: TRANSESOPHAGEAL ECHOCARDIOGRAM (TEE) (N/A ) BUBBLE STUDY     Patient location during evaluation: Endoscopy Anesthesia Type: MAC Level of consciousness: awake and alert Pain management: pain level controlled Vital Signs Assessment: post-procedure vital signs reviewed and stable Respiratory status: spontaneous breathing, nonlabored ventilation, respiratory function stable and patient connected to nasal cannula oxygen Cardiovascular status: stable and blood pressure returned to baseline Postop Assessment: no apparent nausea or vomiting Anesthetic complications: no   No complications documented.  Last Vitals:  Vitals:   04/06/20 0920 04/06/20 0930  BP: (!) 157/108 (!) 156/114  Pulse: 78 77  Resp: 12 (!) 22  Temp:    SpO2: 96% 95%    Last Pain:  Vitals:   04/06/20 0930  TempSrc:   PainSc: 0-No pain                 Belenda Cruise P Eleny Cortez

## 2020-04-06 NOTE — Transfer of Care (Signed)
Immediate Anesthesia Transfer of Care Note  Patient: Dylan Olsen  Procedure(s) Performed: TRANSESOPHAGEAL ECHOCARDIOGRAM (TEE) (N/A ) BUBBLE STUDY  Patient Location: Endoscopy Unit  Anesthesia Type:MAC  Level of Consciousness: awake, alert , oriented and drowsy  Airway & Oxygen Therapy: Patient Spontanous Breathing and Patient connected to nasal cannula oxygen  Post-op Assessment: Report given to RN, Post -op Vital signs reviewed and stable and Patient moving all extremities X 4  Post vital signs: Reviewed and stable  Last Vitals:  Vitals Value Taken Time  BP 145/100 04/06/20 0910  Temp    Pulse 94 04/06/20 0911  Resp 14 04/06/20 0911  SpO2 97 % 04/06/20 0911  Vitals shown include unvalidated device data.  Last Pain:  Vitals:   04/06/20 0757  TempSrc: Oral  PainSc: 0-No pain         Complications: No complications documented.

## 2020-04-06 NOTE — Discharge Instructions (Signed)
Transesophageal Echocardiogram What happens after the procedure?   Your blood pressure, heart rate, breathing rate, and blood oxygen level will be watched until the medicines you were given have worn off.  When you first wake up, your throat may feel sore and numb. This will get better over time. You will not be allowed to eat or drink until the numbness has gone away.  Do not drive for 24 hours if you were given a medicine to help you relax. Summary  TEE is a test that uses sound waves to take pictures of your heart.  You will be given a medicine to help you relax.  Do not drive for 24 hours if you were given a medicine to help you relax. This information is not intended to replace advice given to you by your health care provider. Make sure you discuss any questions you have with your health care provider. Document Revised: 03/13/2018 Document Reviewed: 09/25/2016 Elsevier Patient Education  2020 Elsevier Inc.  

## 2020-04-06 NOTE — H&P (Signed)
    64 yo M with hx of AF Notes palpitations and fatigue Exam notable for irregular heart beat and one loose tooth Prior TTE Reviewed  [facility] has been requested to perform a transesophageal echocardiogram on Dylan Olsen for atrial fibrillatiion.  After careful review of history and examination, the risks and benefits of transesophageal echocardiogram have been explained including risks of esophageal damage, perforation (1:10,000 risk), bleeding, pharyngeal hematoma as well as other potential complications associated with conscious sedation including aspiration, arrhythmia, respiratory failure and death. Alternatives to treatment were discussed, questions were answered. Patient is willing to proceed.   [name], MD  04/06/2020 8:40 AM

## 2020-04-06 NOTE — Progress Notes (Signed)
Echocardiogram Echocardiogram Transesophageal has been performed.  Warren Lacy Benyamin Jeff 04/06/2020, 9:21 AM

## 2020-04-06 NOTE — Progress Notes (Signed)
Instructed patient on the following items: Arrival time 0530 Nothing to eat or drink after midnight No meds AM of procedure Responsible person to drive you home and stay with you for 24 hrs  Have you missed any doses of anti-coagulant on Eliquis, had TEE today no thrombus

## 2020-04-06 NOTE — Anesthesia Preprocedure Evaluation (Addendum)
Anesthesia Evaluation  Patient identified by MRN, date of birth, ID band Patient awake    Reviewed: Patient's Chart, lab work & pertinent test results  History of Anesthesia Complications (+) PROLONGED EMERGENCE  Airway Mallampati: II  TM Distance: >3 FB Neck ROM: Full    Dental  (+) Teeth Intact   Pulmonary neg pulmonary ROS, Current Smoker and Patient abstained from smoking.,    Pulmonary exam normal        Cardiovascular + dysrhythmias Atrial Fibrillation  Rhythm:Irregular Rate:Normal     Neuro/Psych negative neurological ROS  negative psych ROS   GI/Hepatic Neg liver ROS, GERD  Medicated,  Endo/Other  negative endocrine ROS  Renal/GU negative Renal ROS  negative genitourinary   Musculoskeletal negative musculoskeletal ROS (+)   Abdominal (+)  Abdomen: soft. Bowel sounds: normal.  Peds  Hematology negative hematology ROS (+)   Anesthesia Other Findings   Reproductive/Obstetrics negative OB ROS                             Anesthesia Physical Anesthesia Plan  ASA: III  Anesthesia Plan: MAC   Post-op Pain Management:    Induction:   PONV Risk Score and Plan: Treatment may vary due to age or medical condition, Propofol infusion and Ondansetron  Airway Management Planned: Simple Face Mask and Nasal Cannula  Additional Equipment: None  Intra-op Plan:   Post-operative Plan:   Informed Consent: I have reviewed the patients History and Physical, chart, labs and discussed the procedure including the risks, benefits and alternatives for the proposed anesthesia with the patient or authorized representative who has indicated his/her understanding and acceptance.     Dental advisory given  Plan Discussed with: CRNA  Anesthesia Plan Comments: (ECHO 08/21: Rhythm concerning for atrial fibrillation 2. Left ventricular ejection fraction, by estimation, is 40 to 45%. The left  ventricle has mildly decreased function. The left ventricle demonstrates global hypokinesis. Left ventricular diastolic parameters are indeterminate. 3. Right ventricular systolic function is normal. The right ventricular size is normal. There is normal pulmonary artery systolic pressure. The estimated right ventricular systolic pressure is 50.5 mmHg. 4. Left atrial size was mildly dilated. 5. Mild mitral valve regurgitation.)        Anesthesia Quick Evaluation

## 2020-04-07 ENCOUNTER — Encounter (HOSPITAL_COMMUNITY): Payer: Self-pay

## 2020-04-07 ENCOUNTER — Ambulatory Visit (HOSPITAL_COMMUNITY)
Admission: RE | Admit: 2020-04-07 | Discharge: 2020-04-07 | Disposition: A | Discharge status: Home / Self Care | Payer: 59 | Attending: Cardiology | Admitting: Cardiology

## 2020-04-07 ENCOUNTER — Encounter (HOSPITAL_COMMUNITY): Payer: Self-pay | Admitting: Cardiology

## 2020-04-07 ENCOUNTER — Ambulatory Visit (HOSPITAL_COMMUNITY): Payer: 59 | Admitting: Certified Registered Nurse Anesthetist

## 2020-04-07 ENCOUNTER — Encounter: Payer: Self-pay | Admitting: Physician Assistant

## 2020-04-07 ENCOUNTER — Encounter (HOSPITAL_COMMUNITY)
Admission: RE | Disposition: A | Discharge status: Home / Self Care | Payer: Self-pay | Source: Home / Self Care | Attending: Cardiology

## 2020-04-07 ENCOUNTER — Ambulatory Visit (HOSPITAL_COMMUNITY): Admit: 2020-04-07 | Payer: BC Managed Care – PPO | Admitting: Cardiology

## 2020-04-07 DIAGNOSIS — K219 Gastro-esophageal reflux disease without esophagitis: Secondary | ICD-10-CM | POA: Insufficient documentation

## 2020-04-07 DIAGNOSIS — Z79899 Other long term (current) drug therapy: Secondary | ICD-10-CM | POA: Diagnosis not present

## 2020-04-07 DIAGNOSIS — F17290 Nicotine dependence, other tobacco product, uncomplicated: Secondary | ICD-10-CM | POA: Insufficient documentation

## 2020-04-07 DIAGNOSIS — Z7901 Long term (current) use of anticoagulants: Secondary | ICD-10-CM | POA: Diagnosis not present

## 2020-04-07 DIAGNOSIS — I4819 Other persistent atrial fibrillation: Secondary | ICD-10-CM | POA: Insufficient documentation

## 2020-04-07 HISTORY — PX: ATRIAL FIBRILLATION ABLATION: EP1191

## 2020-04-07 LAB — GLUCOSE, CAPILLARY: Glucose-Capillary: 157 mg/dL — ABNORMAL HIGH (ref 70–99)

## 2020-04-07 LAB — POCT ACTIVATED CLOTTING TIME
Activated Clotting Time: 252 seconds
Activated Clotting Time: 252 seconds
Activated Clotting Time: 290 seconds

## 2020-04-07 SURGERY — ATRIAL FIBRILLATION ABLATION
Anesthesia: General

## 2020-04-07 MED ORDER — ONDANSETRON HCL 4 MG/2ML IJ SOLN: 4.0000 mg | Freq: Four times a day (QID) | INTRAMUSCULAR | Status: DC | PRN

## 2020-04-07 MED ORDER — PROPOFOL 10 MG/ML IV BOLUS
INTRAVENOUS | Status: DC | PRN
  Administered 2020-04-07: 20 mg via INTRAVENOUS
  Administered 2020-04-07: 40 mg via INTRAVENOUS
  Administered 2020-04-07: 140 mg via INTRAVENOUS

## 2020-04-07 MED ORDER — HEPARIN SODIUM (PORCINE) 1000 UNIT/ML IJ SOLN
INTRAMUSCULAR | Status: AC
  Filled 2020-04-07: qty 1

## 2020-04-07 MED ORDER — SODIUM CHLORIDE 0.9% FLUSH: 3.0000 mL | Freq: Two times a day (BID) | INTRAVENOUS | Status: DC

## 2020-04-07 MED ORDER — HEPARIN (PORCINE) IN NACL 1000-0.9 UT/500ML-% IV SOLN
INTRAVENOUS | Status: AC
  Filled 2020-04-07: qty 500

## 2020-04-07 MED ORDER — ONDANSETRON HCL 4 MG/2ML IJ SOLN
INTRAMUSCULAR | Status: DC | PRN
  Administered 2020-04-07: 4 mg via INTRAVENOUS

## 2020-04-07 MED ORDER — MIDAZOLAM HCL 5 MG/5ML IJ SOLN
INTRAMUSCULAR | Status: DC | PRN
  Administered 2020-04-07: 2 mg via INTRAVENOUS

## 2020-04-07 MED ORDER — PHENYLEPHRINE HCL-NACL 10-0.9 MG/250ML-% IV SOLN
INTRAVENOUS | Status: DC | PRN
  Administered 2020-04-07: 25 ug/min via INTRAVENOUS

## 2020-04-07 MED ORDER — PROTAMINE SULFATE 10 MG/ML IV SOLN
INTRAVENOUS | Status: DC | PRN
  Administered 2020-04-07: 30 mg via INTRAVENOUS

## 2020-04-07 MED ORDER — DEXAMETHASONE SODIUM PHOSPHATE 10 MG/ML IJ SOLN
INTRAMUSCULAR | Status: DC | PRN
  Administered 2020-04-07: 4 mg via INTRAVENOUS

## 2020-04-07 MED ORDER — SODIUM CHLORIDE 0.9 % IV SOLN: INTRAVENOUS | Status: DC

## 2020-04-07 MED ORDER — PANTOPRAZOLE SODIUM 40 MG PO TBEC
40.0000 mg | DELAYED_RELEASE_TABLET | Freq: Every day | ORAL | Status: DC
  Administered 2020-04-07: 40 mg via ORAL
  Filled 2020-04-07: qty 1

## 2020-04-07 MED ORDER — ROCURONIUM BROMIDE 10 MG/ML (PF) SYRINGE
PREFILLED_SYRINGE | INTRAVENOUS | Status: DC | PRN
  Administered 2020-04-07: 60 mg via INTRAVENOUS
  Administered 2020-04-07 (×2): 20 mg via INTRAVENOUS

## 2020-04-07 MED ORDER — SODIUM CHLORIDE 0.9% FLUSH: 3.0000 mL | INTRAVENOUS | Status: DC | PRN

## 2020-04-07 MED ORDER — SUGAMMADEX SODIUM 200 MG/2ML IV SOLN
INTRAVENOUS | Status: DC | PRN
  Administered 2020-04-07: 200 mg via INTRAVENOUS

## 2020-04-07 MED ORDER — LIDOCAINE 2% (20 MG/ML) 5 ML SYRINGE
INTRAMUSCULAR | Status: DC | PRN
  Administered 2020-04-07: 40 mg via INTRAVENOUS

## 2020-04-07 MED ORDER — HEPARIN SODIUM (PORCINE) 1000 UNIT/ML IJ SOLN
INTRAMUSCULAR | Status: DC | PRN
  Administered 2020-04-07: 7000 [IU] via INTRAVENOUS
  Administered 2020-04-07: 3000 [IU] via INTRAVENOUS
  Administered 2020-04-07: 15000 [IU] via INTRAVENOUS
  Administered 2020-04-07: 5000 [IU] via INTRAVENOUS

## 2020-04-07 MED ORDER — PANTOPRAZOLE SODIUM 40 MG PO TBEC: 40.0000 mg | DELAYED_RELEASE_TABLET | Freq: Every day | ORAL | 0 refills | Status: DC

## 2020-04-07 MED ORDER — APIXABAN 5 MG PO TABS
5.0000 mg | ORAL_TABLET | Freq: Two times a day (BID) | ORAL | Status: DC
  Administered 2020-04-07: 5 mg via ORAL
  Filled 2020-04-07: qty 1

## 2020-04-07 MED ORDER — HEPARIN SODIUM (PORCINE) 1000 UNIT/ML IJ SOLN
INTRAMUSCULAR | Status: DC | PRN
  Administered 2020-04-07: 1000 [IU] via INTRAVENOUS

## 2020-04-07 MED ORDER — FENTANYL CITRATE (PF) 250 MCG/5ML IJ SOLN
INTRAMUSCULAR | Status: DC | PRN
Start: 2020-04-07 — End: 2020-04-07
  Administered 2020-04-07: 100 ug via INTRAVENOUS

## 2020-04-07 MED ORDER — ACETAMINOPHEN 325 MG PO TABS
650.0000 mg | ORAL_TABLET | ORAL | Status: DC | PRN
  Filled 2020-04-07: qty 2

## 2020-04-07 MED ORDER — SODIUM CHLORIDE 0.9 % IV SOLN: 250.0000 mL | INTRAVENOUS | Status: DC | PRN

## 2020-04-07 MED ORDER — HEPARIN (PORCINE) IN NACL 1000-0.9 UT/500ML-% IV SOLN
INTRAVENOUS | Status: DC | PRN
  Administered 2020-04-07 (×4): 500 mL

## 2020-04-07 SURGICAL SUPPLY — 21 items
BLANKET WARM UNDERBOD FULL ACC (MISCELLANEOUS) ×3 IMPLANT
CATH 8FR REPROCESSED SOUNDSTAR (CATHETERS) ×3 IMPLANT
CATH 8FR SOUNDSTAR REPROCESSED (CATHETERS) IMPLANT
CATH MAPPNG PENTARAY F 2-6-2MM (CATHETERS) IMPLANT
CATH S CIRCA THERM PROBE 10F (CATHETERS) ×2 IMPLANT
CATH SMTCH THERMOCOOL SF DF (CATHETERS) ×2 IMPLANT
CATH WEBSTER BI DIR CS D-F CRV (CATHETERS) ×2 IMPLANT
COVER SWIFTLINK CONNECTOR (BAG) ×3 IMPLANT
DEVICE CLOSURE PERCLS PRGLD 6F (VASCULAR PRODUCTS) IMPLANT
PACK EP LATEX FREE (CUSTOM PROCEDURE TRAY) ×3
PACK EP LF (CUSTOM PROCEDURE TRAY) ×1 IMPLANT
PAD PRO RADIOLUCENT 2001M-C (PAD) ×3 IMPLANT
PATCH CARTO3 (PAD) ×2 IMPLANT
PENTARAY F 2-6-2MM (CATHETERS) ×3
PERCLOSE PROGLIDE 6F (VASCULAR PRODUCTS) ×9
SHEATH BAYLIS TRANSSEPTAL 98CM (NEEDLE) ×2 IMPLANT
SHEATH CARTO VIZIGO SM CVD (SHEATH) ×2 IMPLANT
SHEATH PINNACLE 8F 10CM (SHEATH) ×4 IMPLANT
SHEATH PINNACLE 9F 10CM (SHEATH) ×2 IMPLANT
SHEATH PROBE COVER 6X72 (BAG) ×2 IMPLANT
TUBING SMART ABLATE COOLFLOW (TUBING) ×2 IMPLANT

## 2020-04-07 NOTE — Transfer of Care (Signed)
Immediate Anesthesia Transfer of Care Note  Patient: Dylan Olsen  Procedure(s) Performed: ATRIAL FIBRILLATION ABLATION (N/A )  Patient Location: Cath Lab  Anesthesia Type:General  Level of Consciousness: drowsy and responds to stimulation  Airway & Oxygen Therapy: Patient Spontanous Breathing and Patient connected to nasal cannula oxygen  Post-op Assessment: Report given to RN and Post -op Vital signs reviewed and stable  Post vital signs: Reviewed and stable  Last Vitals:  Vitals Value Taken Time  BP 112/74 04/07/20 1017  Temp    Pulse 64 04/07/20 1018  Resp 18 04/07/20 1018  SpO2 94 % 04/07/20 1018  Vitals shown include unvalidated device data.  Last Pain:  Vitals:   04/07/20 0535  TempSrc: Oral         Complications: No complications documented.

## 2020-04-07 NOTE — H&P (Signed)
Electrophysiology Office Note:    Date:  03/06/2020   ID:  Olsen, Dylan 29-Oct-1955, MRN [id number]  PCP:  Lynnda Child, MD           [facility] Cardiologist:  No primary care provider on file.  [facility] Electrophysiologist:  [name], MD   Referring MD: Lynnda Child, MD   Chief Complaint: Atrial fibrillation  History of Present Illness:    Dylan Olsen Dylan Olsen is a 64 y.o. male with a hx of vertigo, ED and GERD who presents to the clinic for evaluation of a new diagnosis of atrial fibrillation. He had a recent echo which showed depressed EF with global LV dysfunction. His AF is symptomatic with dyspnea on exertion. Wife reports that he was very active previously and now he is winded after going to the mailbox. He is not on [facility] given he prefers to be off medications and a low CHADSVASc. Works with [employer] for [employer].  History reviewed. No pertinent past medical history.       Past Surgical History:  Procedure Laterality Date  . BACK SURGERY    . FOOT SURGERY    . KNEE SURGERY Left     Current Medications: Active Medications      Current Meds  Medication Sig  . atorvastatin (LIPITOR) 10 MG tablet Take 1 tablet (10 mg total) by mouth daily.  . cetirizine (ZYRTEC) 10 MG tablet Take 10 mg by mouth daily.  . famotidine (PEPCID) 40 MG tablet Take 40 mg by mouth daily.  . Meclizine HCl (BONINE PO) Take by mouth as needed. Take as needed for vertigo       Allergies:   Patient has no allergy information on record.   Social History        Socioeconomic History  . Marital status: Married    Spouse name: [name]  . Number of children: 2  . Years of education: high school  . Highest education level: Not on file  Occupational History  . Occupation: Curator  Tobacco Use  . Smoking status: Current Every Day Smoker    Types: Cigars  . Smokeless tobacco: Never Used  . Tobacco comment: a few times a year    Vaping Use  . Vaping Use: Never used  Substance and Sexual Activity  . Alcohol use: Yes    Comment: less than monthly  . Drug use: Yes    Types: Marijuana    Comment: a few times a week  . Sexual activity: Yes    Birth control/protection: Post-menopausal  Other Topics Concern  . Not on file  Social History Narrative   01/13/20   From: [place], [place] originally - moved to [facility] [place]   Living: with wife Dylan Olsen (1996), and step daughter (special needs)   Work: English as a second language teacher for [employer]      Family: 2 children - Dylan Olsen and Dylan Olsen - in [place], good relationship and 3 grandchildren and 2 step children      Enjoys: drag racing, football      Exercise: not currently - walking and mobile job   Diet: not great, sometimes a little better      Safety   Seat belts: Yes    Guns: Yes  and secure   Safe in relationships: Yes    Social Determinants of Health      Financial Resource Strain:   . Difficulty of Paying Living Expenses: Not on file  Food Insecurity:   .  Worried About Programme researcher, broadcasting/film/video in the Last Year: Not on file  . Ran Out of Food in the Last Year: Not on file  Transportation Needs:   . Lack of Transportation (Medical): Not on file  . Lack of Transportation (Non-Medical): Not on file  Physical Activity:   . Days of Exercise per Week: Not on file  . Minutes of Exercise per Session: Not on file  Stress:   . Feeling of Stress : Not on file  Social Connections:   . Frequency of Communication with Friends and Family: Not on file  . Frequency of Social Gatherings with Friends and Family: Not on file  . Attends Religious Services: Not on file  . Active Member of Clubs or Organizations: Not on file  . Attends Banker Meetings: Not on file  . Marital Status: Not on file     Family History: The patient's family history includes Cancer in his cousin; Heart attack (age of onset: 42) in his father; Heart disease in his  father.  ROS:   Please see the history of present illness.    All other systems reviewed and are negative.  EKGs/Labs/Other Studies Reviewed:    The following studies were reviewed today: Echo, ECG  02/17/2020 Echo 1. Rhythm concerning for atrial fibrillation  2. Left ventricular ejection fraction, by estimation, is 40 to 45%. The  left ventricle has mildly decreased function. The left ventricle  demonstrates global hypokinesis. Left ventricular diastolic parameters are  indeterminate.  3. Right ventricular systolic function is normal. The right ventricular  size is normal. There is normal pulmonary artery systolic pressure. The  estimated right ventricular systolic pressure is 26.0 mmHg.  4. Left atrial size was mildly dilated.  5. Mild mitral valve regurgitation.   01/13/2020 ECG Atrial fibrillation with ventricular rate 108. RBBB in V1.    EKG:  The ekg ordered today demonstrates atrial fibrillation with RVR  Recent Labs: 01/13/2020: ALT 33; BUN 11; Creatinine, Ser 0.85; Potassium 4.0; Pro B Natriuretic peptide (BNP) 66.0; Sodium 139  Recent Lipid Panel Labs (Brief)           Component Value Date/Time   CHOL 215 (H) 01/13/2020 1632   TRIG (H) 01/13/2020 1632    430.0 Triglyceride is over 400; calculations on Lipids are invalid.   HDL 35.30 (L) 01/13/2020 1632   CHOLHDL 6 01/13/2020 1632   LDLDIRECT 105.0 01/13/2020 1632      Physical Exam:    VS:  BP 156/100   Pulse (!) 94   Ht 5' 10.75" (1.797 m)   Wt 203 lb 3.2 oz (92.2 kg)   SpO2 98%   BMI 28.54 kg/m    RR20     Wt Readings from Last 3 Encounters:  03/06/20 203 lb 3.2 oz (92.2 kg)  01/13/20 201 lb 8 oz (91.4 kg)    GEN:  Well nourished, well developed in no acute distress HEENT: Normal NECK: No JVD; No carotid bruits LYMPHATICS: No lymphadenopathy CARDIAC: irregularly irregular, no murmurs, rubs, gallops RESPIRATORY:  Clear to auscultation without rales, wheezing or rhonchi   ABDOMEN: Soft, non-tender, non-distended MUSCULOSKELETAL:  No edema; No deformity  SKIN: Warm and dry NEUROLOGIC:  Alert and oriented x 3 PSYCHIATRIC:  Normal affect    ASSESSMENT:    1. Persistent atrial fibrillation (HCC)    PLAN:    In order of problems listed above:  1. Persistent atrial fibrillation, symptomatic Symptomatic atrial fibrillation with depressed ejection fraction. Patient would like  to pursue rhythm control strategy. Proceed with PVI + posterior wall. Continue eliquis.   Signed, [name], MD  03/06/2020 4:24 PM    [facility]   --------------------------------------------------------------------------  I have seen, examined the patient, and reviewed the above assessment and plan. Plan to proceed with PVI.  [name], MD 04/07/2020 7:12 AM

## 2020-04-07 NOTE — Discharge Instructions (Signed)
Post procedure care instructions No driving for 4 days. No lifting over 5 lbs for 1 week. No vigorous or sexual activity for 1 week. You may return to work/your usual activities on 04/14/2020. Keep procedure site clean & dry. If you notice increased pain, swelling, bleeding or pus, call/return!  You may shower, but no soaking baths/hot tubs/pools for 1 week.     You have an appointment set up with the Atrial Fibrillation Clinic.  Multiple studies have shown that being followed by a dedicated atrial fibrillation clinic in addition to the standard care you receive from your other physicians improves health. We believe that enrollment in the atrial fibrillation clinic will allow us to better care for you.   The phone number to the Atrial Fibrillation Clinic is [phone]. The clinic is staffed Monday through Friday from 8:30am to 5pm.  Parking Directions: The clinic is located in the Heart and Vascular Building connected to [facility]. 1)From [address] turn on to [address] and go to the 3rd entrance  (Heart and Vascular entrance) on the right. 2)Look to the right for Heart &Vascular Parking Garage. 3)A code for the entrance is required, for October is 3009.   4)Take the elevators to the 1st floor. Registration is in the room with the glass walls at the end of the hallway.  If you have any trouble parking or locating the clinic, please don't hesitate to call [phone]. 

## 2020-04-07 NOTE — Progress Notes (Signed)
Patient arrived stable but not responsive to jaw thrust or sternal rub. Ambu bag was used for supplemental use. ambu bag did get response from patient but he immediately went back to unresponsive state.  Dr Noreene Larsson and Antony Odea at bedside. No additional orders. Continuing to monitor.  Vitals and breathing stable with no assistance.  Patient does wake up randomly- stated he was not in pain and went back to sleep.  Patient still not responsive to name or touch.

## 2020-04-07 NOTE — Anesthesia Procedure Notes (Signed)
Procedure Name: Intubation Date/Time: 04/07/2020 7:41 AM Performed by: Waynard Edwards, CRNA Pre-anesthesia Checklist: Patient identified, Suction available, Emergency Drugs available and Patient being monitored Patient Re-evaluated:Patient Re-evaluated prior to induction Oxygen Delivery Method: Circle system utilized Preoxygenation: Pre-oxygenation with 100% oxygen Induction Type: IV induction Ventilation: Mask ventilation without difficulty and Oral airway inserted - appropriate to patient size Laryngoscope Size: Hyacinth Meeker and 2 Grade View: Grade II Tube type: Oral Tube size: 7.5 mm Number of attempts: 1 Airway Equipment and Method: Stylet Placement Confirmation: ETT inserted through vocal cords under direct vision,  positive ETCO2 and breath sounds checked- equal and bilateral Secured at: 22 cm Tube secured with: Tape Dental Injury: Teeth and Oropharynx as per pre-operative assessment

## 2020-04-07 NOTE — Progress Notes (Signed)
Patient was given discharge instructions. He verbalized understanding. 

## 2020-04-07 NOTE — Anesthesia Preprocedure Evaluation (Signed)
Anesthesia Evaluation  Patient identified by MRN, date of birth, ID band Patient awake    Reviewed: Allergy & Precautions, NPO status , Patient's Chart, lab work & pertinent test results  Airway Mallampati: II   Neck ROM: Full    Dental  (+) Teeth Intact, Dental Advisory Given,    Pulmonary Current Smoker and Patient abstained from smoking.,    breath sounds clear to auscultation       Cardiovascular  Rhythm:Irregular Rate:Normal     Neuro/Psych    GI/Hepatic   Endo/Other    Renal/GU      Musculoskeletal   Abdominal   Peds  Hematology   Anesthesia Other Findings   Reproductive/Obstetrics                             Anesthesia Physical Anesthesia Plan  ASA: III  Anesthesia Plan: General   Post-op Pain Management:    Induction: Intravenous  PONV Risk Score and Plan: Ondansetron and Dexamethasone  Airway Management Planned: Oral ETT  Additional Equipment:   Intra-op Plan:   Post-operative Plan: Extubation in OR  Informed Consent: I have reviewed the patients History and Physical, chart, labs and discussed the procedure including the risks, benefits and alternatives for the proposed anesthesia with the patient or authorized representative who has indicated his/her understanding and acceptance.     Dental advisory given  Plan Discussed with: CRNA and Anesthesiologist  Anesthesia Plan Comments:         Anesthesia Quick Evaluation

## 2020-04-08 ENCOUNTER — Emergency Department (HOSPITAL_BASED_OUTPATIENT_CLINIC_OR_DEPARTMENT_OTHER): Payer: 59

## 2020-04-08 ENCOUNTER — Encounter (HOSPITAL_COMMUNITY): Payer: Self-pay | Admitting: Emergency Medicine

## 2020-04-08 ENCOUNTER — Observation Stay (HOSPITAL_COMMUNITY)
Admission: EM | Admit: 2020-04-08 | Discharge: 2020-04-09 | Disposition: A | Discharge status: Home / Self Care | Payer: 59 | Attending: Cardiology | Admitting: Cardiology

## 2020-04-08 ENCOUNTER — Other Ambulatory Visit: Payer: Self-pay

## 2020-04-08 ENCOUNTER — Emergency Department (HOSPITAL_COMMUNITY): Payer: 59

## 2020-04-08 DIAGNOSIS — I309 Acute pericarditis, unspecified: Secondary | ICD-10-CM | POA: Diagnosis not present

## 2020-04-08 DIAGNOSIS — Z79899 Other long term (current) drug therapy: Secondary | ICD-10-CM | POA: Insufficient documentation

## 2020-04-08 DIAGNOSIS — R072 Precordial pain: Secondary | ICD-10-CM

## 2020-04-08 DIAGNOSIS — F17290 Nicotine dependence, other tobacco product, uncomplicated: Secondary | ICD-10-CM | POA: Insufficient documentation

## 2020-04-08 DIAGNOSIS — R079 Chest pain, unspecified: Secondary | ICD-10-CM

## 2020-04-08 DIAGNOSIS — I319 Disease of pericardium, unspecified: Secondary | ICD-10-CM | POA: Diagnosis present

## 2020-04-08 DIAGNOSIS — E782 Mixed hyperlipidemia: Secondary | ICD-10-CM

## 2020-04-08 HISTORY — DX: Disease of pericardium, unspecified: I31.9

## 2020-04-08 HISTORY — DX: Other persistent atrial fibrillation: I48.19

## 2020-04-08 LAB — CBC
HCT: 39.6 % (ref 39.0–52.0)
Hemoglobin: 13.3 g/dL (ref 13.0–17.0)
MCH: 30.4 pg (ref 26.0–34.0)
MCHC: 33.6 g/dL (ref 30.0–36.0)
MCV: 90.6 fL (ref 80.0–100.0)
Platelets: 168 10*3/uL (ref 150–400)
RBC: 4.37 MIL/uL (ref 4.22–5.81)
RDW: 12 % (ref 11.5–15.5)
WBC: 9.2 10*3/uL (ref 4.0–10.5)
nRBC: 0 % (ref 0.0–0.2)

## 2020-04-08 LAB — TROPONIN I (HIGH SENSITIVITY)
Troponin I (High Sensitivity): 1596 ng/L (ref <18)
Troponin I (High Sensitivity): 1382 ng/L (ref <18)

## 2020-04-08 LAB — BASIC METABOLIC PANEL
Anion gap: 10 (ref 5–15)
BUN: 13 mg/dL (ref 8–23)
CO2: 27 mmol/L (ref 22–32)
Calcium: 8.9 mg/dL (ref 8.9–10.3)
Chloride: 102 mmol/L (ref 98–111)
Creatinine, Ser: 0.76 mg/dL (ref 0.61–1.24)
GFR calc Af Amer: >60 mL/min (ref >60)
GFR calc non Af Amer: >60 mL/min (ref >60)
Glucose, Bld: 106 mg/dL — ABNORMAL HIGH (ref 70–99)
Potassium: 3.7 mmol/L (ref 3.5–5.1)
Sodium: 139 mmol/L (ref 135–145)

## 2020-04-08 LAB — ECHOCARDIOGRAM LIMITED
Height: 71 in
Weight: 3184 oz

## 2020-04-08 LAB — HIV ANTIBODY (ROUTINE TESTING W REFLEX): HIV Screen 4th Generation wRfx: NONREACTIVE

## 2020-04-08 MED ORDER — FENTANYL CITRATE (PF) 100 MCG/2ML IJ SOLN: 25.0000 ug | Freq: Once | INTRAMUSCULAR | Status: DC

## 2020-04-08 MED ORDER — FAMOTIDINE 20 MG PO TABS: 20.0000 mg | ORAL_TABLET | Freq: Two times a day (BID) | ORAL | Status: DC | PRN

## 2020-04-08 MED ORDER — DEXTROSE 50 % IV SOLN: 50.0000 mL | Freq: Once | INTRAVENOUS | Status: DC

## 2020-04-08 MED ORDER — ACETAMINOPHEN 325 MG PO TABS: 650.0000 mg | ORAL_TABLET | ORAL | Status: DC | PRN

## 2020-04-08 MED ORDER — FENTANYL CITRATE (PF) 100 MCG/2ML IJ SOLN
50.0000 ug | Freq: Once | INTRAMUSCULAR | Status: DC
  Filled 2020-04-08: qty 2

## 2020-04-08 MED ORDER — APIXABAN 5 MG PO TABS
5.0000 mg | ORAL_TABLET | Freq: Two times a day (BID) | ORAL | Status: DC
  Administered 2020-04-08 – 2020-04-09 (×2): 5 mg via ORAL
  Filled 2020-04-08 (×2): qty 1

## 2020-04-08 MED ORDER — PANTOPRAZOLE SODIUM 40 MG PO TBEC
40.0000 mg | DELAYED_RELEASE_TABLET | Freq: Every day | ORAL | Status: DC
  Administered 2020-04-08 – 2020-04-09 (×2): 40 mg via ORAL
  Filled 2020-04-08 (×2): qty 1

## 2020-04-08 MED ORDER — ATORVASTATIN CALCIUM 10 MG PO TABS
10.0000 mg | ORAL_TABLET | Freq: Every day | ORAL | Status: DC
  Administered 2020-04-08 – 2020-04-09 (×2): 10 mg via ORAL
  Filled 2020-04-08 (×2): qty 1

## 2020-04-08 MED ORDER — FENTANYL CITRATE (PF) 100 MCG/2ML IJ SOLN
25.0000 ug | Freq: Once | INTRAMUSCULAR | Status: AC
  Administered 2020-04-08: 25 ug via INTRAVENOUS
  Filled 2020-04-08: qty 2

## 2020-04-08 MED ORDER — ONDANSETRON HCL 4 MG/2ML IJ SOLN: 4.0000 mg | Freq: Four times a day (QID) | INTRAMUSCULAR | Status: DC | PRN

## 2020-04-08 MED ORDER — KETOROLAC TROMETHAMINE 30 MG/ML IJ SOLN
30.0000 mg | Freq: Once | INTRAMUSCULAR | Status: AC
  Administered 2020-04-08: 30 mg via INTRAVENOUS
  Filled 2020-04-08: qty 1

## 2020-04-08 MED ORDER — ACETAMINOPHEN-CODEINE #3 300-30 MG PO TABS
2.0000 | ORAL_TABLET | Freq: Three times a day (TID) | ORAL | Status: DC | PRN
  Administered 2020-04-09: 2 via ORAL
  Filled 2020-04-08: qty 2

## 2020-04-08 NOTE — H&P (Addendum)
Cardiology H&P:   Patient ID: Dylan FormRandy L Ten Olsen MRN: [id number]; DOB: Jul 29, 1955  Admit date: 04/08/2020 Date of Consult: 04/08/2020  Primary Care Provider: Lynnda Childody, Jessica R, MD [facility] Cardiologist: No primary care provider on file.  [facility] Electrophysiologist:  Lanier PrudeAMERON T LAMBERT, MD    Patient Profile:   Dylan MillersRandy L Ten Kandice Moosyck is a 64 y.o. male with a hx of vertigo, GERD, and afib who is being seen today for the evaluation of chest pain at the request of Dr. Lockie Molauratolo.  History of Present Illness:   Mr. Dylan Olsen is a 64 yo male with PMH of vertigo, GERD and afib who is followed by Dr. Lalla BrothersLambert as an outpatient. He was seen back on 03/06/20 in the office. Noted indicate he had a recent echo that showed a EF of 40-45% with global hypokinesis, normal RV and mildly dilated left atria. Options were discussed in the office regarding pharmacologic treatment vs ablation. He elected to pursue ablation. He was placed on Eliquis 3 weeks prior to procedure. Presented on 10/1 and underwent successful ablatio of posterior wall with no early complications noted.   Presented to the ED today with reports of chest pain that started about 2am this morning. Worse with inspiration, and laying flat. Labs showed stable electrolytes, Cr 0.76, hsTn 1596, WBC 9.2, Hgb 13.3. CXR negative. EKG showed SR 71bpm, no ischemia noted. Echo ordered in the ED   Past Medical History:  Diagnosis Date  . Complication of anesthesia    difficult to wake, stopped breathing, bagged  . Dysrhythmia     Past Surgical History:  Procedure Laterality Date  . BACK SURGERY    . FOOT SURGERY    . KNEE SURGERY Left      Home Medications:  Prior to Admission medications   Medication Sig Start Date End Date Taking? Authorizing Provider  acetaminophen (TYLENOL) 500 MG tablet Take 1,000 mg by mouth every 6 (six) hours as needed (for pain.).    [provider]  apixaban (ELIQUIS) 5 MG TABS tablet Take 1 tablet (5 mg  total) by mouth 2 (two) times daily. 03/24/20   Lanier PrudeLambert, Cameron T, MD  atorvastatin (LIPITOR) 10 MG tablet Take 1 tablet (10 mg total) by mouth daily. 01/17/20   Lynnda Childody, Jessica R, MD  cetirizine (ZYRTEC) 10 MG tablet Take 10 mg by mouth daily.    [provider]  famotidine (PEPCID) 20 MG tablet Take 20 mg by mouth 2 (two) times daily as needed for heartburn or indigestion.    [provider]  Meclizine HCl (BONINE PO) Take 1-2 tablets by mouth 3 (three) times daily as needed (vertigo/dizziness.). Take as needed for vertigo     [provider]  pantoprazole (PROTONIX) 40 MG tablet Take 1 tablet (40 mg total) by mouth daily. 04/07/20 05/22/20  Sheilah PigeonUrsuy, Renee Lynn, PA-C    Inpatient Medications: Scheduled Meds: . fentaNYL (SUBLIMAZE) injection  25 mcg Intravenous Once   Continuous Infusions:  PRN Meds:   Allergies:    Allergies  Allergen Reactions  . Other Other (See Comments)    Pt reports he "coded after having anesthesia with a back surgery"  Can not recall what medicine    Social History:   Social History   Socioeconomic History  . Marital status: Married    Spouse name: [name]  . Number of children: 2  . Years of education: high school  . Highest education level: Not on file  Occupational History  . Occupation: CuratorMechanic  Tobacco Use  . Smoking status: Current Every Day Smoker    Types: Cigars  . Smokeless tobacco: Never Used  . Tobacco comment: a few times a year  Vaping Use  . Vaping Use: Never used  Substance and Sexual Activity  . Alcohol use: Yes    Comment: less than monthly  . Drug use: Yes    Types: Marijuana    Comment: a few times a week  . Sexual activity: Yes    Birth control/protection: Post-menopausal  Other Topics Concern  . Not on file  Social History Narrative   01/13/20   From: [place], [place] originally - moved to [facility] 1996   Living: with wife [name] (1996), and step daughter (special needs)   Work: English as a second language teacher for [employer]      Family: 2 children - [name] and Fenton - in [place], good relationship and 3 grandchildren and 2 step children      Enjoys: drag racing, football      Exercise: not currently - walking and mobile job   Diet: not great, sometimes a little better      Safety   Seat belts: Yes    Guns: Yes  and secure   Safe in relationships: Yes    Social Determinants of Health   Financial Resource Strain:   . Difficulty of Paying Living Expenses: Not on file  Food Insecurity:   . Worried About Programme researcher, broadcasting/film/video in the Last Year: Not on file  . Ran Out of Food in the Last Year: Not on file  Transportation Needs:   . Lack of Transportation (Medical): Not on file  . Lack of Transportation (Non-Medical): Not on file  Physical Activity:   . Days of Exercise per Week: Not on file  . Minutes of Exercise per Session: Not on file  Stress:   . Feeling of Stress : Not on file  Social Connections:   . Frequency of Communication with Friends and Family: Not on file  . Frequency of Social Gatherings with Friends and Family: Not on file  . Attends Religious Services: Not on file  . Active Member of Clubs or Organizations: Not on file  . Attends Banker Meetings: Not on file  . Marital Status: Not on file  Intimate Partner Violence:   . Fear of Current or Ex-Partner: Not on file  . Emotionally Abused: Not on file  . Physically Abused: Not on file  . Sexually Abused: Not on file    Family History:    Family History  Problem Relation Age of Onset  . Heart disease Father   . Heart attack Father 47  . Cancer Cousin        unknown primary     ROS:  Please see the history of present illness.   All other ROS reviewed and negative.     Physical Exam/Data:   Vitals:   04/08/20 1600 04/08/20 1615 04/08/20 1630 04/08/20 1645  BP: (!) 154/95 (!) 163/94 (!) 157/94 (!) 160/95  Pulse: 79 74 76 73  Resp: 20 (!) 21 (!) 21 (!) 24  Temp:      SpO2: 98% 98% 97% 99%  Weight:        Height:       No intake or output data in the 24 hours ending 04/08/20 1715 Last 3 Weights 04/08/2020 04/07/2020 04/06/2020  Weight (lbs) 199 lb 192 lb 190 lb  Weight (kg) 90.266 kg 87.091 kg 86.183 kg  Body mass index is 27.75 kg/m.  General:  Well nourished, well developed, in no acute distress HEENT: normal Lymph: no adenopathy Neck: no JVD Endocrine:  No thryomegaly Vascular: No carotid bruits Cardiac:  normal S1, S2; RRR; no murmur  Lungs:  clear to auscultation bilaterally, no wheezing, rhonchi or rales  Abd: soft, nontender, no hepatomegaly  Ext: no edema Musculoskeletal:  No deformities, BUE and BLE strength normal and equal Skin: warm and dry  Neuro:  CNs 2-12 intact, no focal abnormalities noted Psych:  Normal affect   EKG:  The EKG was personally reviewed and demonstrates:  SR 71bpm, no acute ST/T wave changes  Relevant CV Studies:  Echo: 02/2020  IMPRESSIONS    1. Rhythm concerning for atrial fibrillation  2. Left ventricular ejection fraction, by estimation, is 40 to 45%. The  left ventricle has mildly decreased function. The left ventricle  demonstrates global hypokinesis. Left ventricular diastolic parameters are  indeterminate.  3. Right ventricular systolic function is normal. The right ventricular  size is normal. There is normal pulmonary artery systolic pressure. The  estimated right ventricular systolic pressure is 26.0 mmHg.  4. Left atrial size was mildly dilated.  5. Mild mitral valve regurgitation.   Laboratory Data:  High Sensitivity Troponin:   Recent Labs  Lab 04/08/20 1326 04/08/20 1551  TROPONINIHS 1,596* 1,382*     Chemistry Recent Labs  Lab 04/08/20 1326  NA 139  K 3.7  CL 102  CO2 27  GLUCOSE 106*  BUN 13  CREATININE 0.76  CALCIUM 8.9  GFRNONAA >60  GFRAA >60  ANIONGAP 10    No results for input(s): PROT, ALBUMIN, AST, ALT, ALKPHOS, BILITOT in the last 168 hours. Hematology Recent Labs  Lab 04/08/20 1326   WBC 9.2  RBC 4.37  HGB 13.3  HCT 39.6  MCV 90.6  MCH 30.4  MCHC 33.6  RDW 12.0  PLT 168   BNPNo results for input(s): BNP, PROBNP in the last 168 hours.  DDimer No results for input(s): DDIMER in the last 168 hours.   Radiology/Studies:  DG Chest 2 View  Result Date: 04/08/2020 CLINICAL DATA:  Chest pain. EXAM: CHEST - 2 VIEW COMPARISON:  December 10, 2006. FINDINGS: The heart size and mediastinal contours are within normal limits. No pneumothorax or pleural effusion is noted. Stable calcified granuloma is noted in right lung base. No acute pulmonary disease is noted. The visualized skeletal structures are unremarkable. IMPRESSION: No active cardiopulmonary disease. Electronically Signed   By: Lupita Raider M.D.   On: 04/08/2020 13:59   ECHO TEE  Result Date: 04/06/2020    TRANSESOPHOGEAL ECHO REPORT   Patient Name:   OCIEL RETHERFORD TEN Olsen Date of Exam: 04/06/2020 Medical Rec #:  [id number]        Height:       71.0 in Accession #:    [id number]       Weight:       190.0 lb Date of Birth:  1956-04-12        BSA:          2.063 m Patient Age:    64 years         BP:           174/137 mmHg Patient Gender: M                HR:           105 bpm. Exam Location:  Inpatient Procedure: Transesophageal Echo, 3D  Echo, Color Doppler, Cardiac Doppler and            Saline Contrast Bubble Study Indications:     I48.91* Unspecified atrial fibrillation  History:         Patient has prior history of Echocardiogram examinations, most                  recent 02/18/2020. Arrythmias:Atrial Fibrillation.  Sonographer:     Irving Burton Senior RDCS Referring Phys:  [id number] [facility] A Izora Ribas Diagnosing Phys: Riley Lam MD PROCEDURE: After discussion of the risks and benefits of a TEE, an informed consent was obtained from the patient. The transesophogeal probe was passed without difficulty through the esophogus of the patient. Local oropharyngeal anesthetic was provided with Cetacaine. Sedation performed by different  physician. The patient was monitored while under deep sedation. Anesthestetic sedation was provided intravenously by Anesthesiology: 352mg  of Propofol, 40mg  of Lidocaine. The patient developed no complications during the procedure. IMPRESSIONS  1. Left ventricular ejection fraction, by estimation, is 50 to 55%. Left ventricular ejection fraction by 3D volume is 52 %. The left ventricle has low normal function. The left ventricle has no regional wall motion abnormalities. Left ventricular diastolic function could not be evaluated.  2. Right ventricular systolic function is normal. The right ventricular size is normal.  3. Left atrial size was dilated. No left atrial/left atrial appendage thrombus was detected. The LAA emptying velocity was 30 cm/s.  4. The mitral valve is normal in structure. Trivial mitral valve regurgitation.  5. The aortic valve is tricuspid. Aortic valve regurgitation is not visualized.  6. There is mild (Grade II) plaque involving the transverse aorta and descending aorta.  7. Evidence of atrial level shunting detected by color flow Doppler. There is evidence of PFO with baseline Color Doppler L-R Shunt. With Valsalva Bubble study, small volume evidence of R-> L shunt.. There is a patent foramen ovale with predominantly left to right shunting across the atrial septum. Conclusion(s)/Recommendation(s): No evidence of LAA Thrombus. PFO noted with L-R shunt. FINDINGS  Left Ventricle: Left ventricular ejection fraction, by estimation, is 50 to 55%. Left ventricular ejection fraction by 3D volume is 52 %. The left ventricle has low normal function. The left ventricle has no regional wall motion abnormalities. The left ventricular internal cavity size was normal in size. There is no left ventricular hypertrophy. Left ventricular diastolic function could not be evaluated. Right Ventricle: The right ventricular size is normal. Right vetricular wall thickness was not assessed. Right ventricular systolic  function is normal. Left Atrium: Left atrial size was dilated. No left atrial/left atrial appendage thrombus was detected. The LAA emptying velocity was 30 cm/s. Right Atrium: Right atrial size was normal in size. Pericardium: There is no evidence of pericardial effusion. Mitral Valve: The mitral valve is normal in structure. Trivial mitral valve regurgitation. Tricuspid Valve: The tricuspid valve is normal in structure. Tricuspid valve regurgitation is not demonstrated. Aortic Valve: The aortic valve is tricuspid. Aortic valve regurgitation is not visualized. Pulmonic Valve: The pulmonic valve was normal in structure. Pulmonic valve regurgitation is trivial. Aorta: The aortic root and ascending aorta are structurally normal, with no evidence of dilitation. There is mild (Grade II) plaque involving the transverse aorta and descending aorta. Venous: One solitary beat of systolic flow reversal, otherwise normal wave forms. IAS/Shunts: Evidence of atrial level shunting detected by color flow Doppler. Agitated saline contrast was given intravenously to evaluate for intracardiac shunting. There is evidence of PFO with baseline Color Doppler  L-R Shunt. With Valsalva Bubble study, small volume evidence of R-> L shunt.   3D Volume EF LV 3D EF:    Left ventricular ejection fraction by 3D volume is 52              %.  3D Volume EF LV 3D EF:    52.60 % LV 3D EDV:   151400.00 mm LV 3D ESV:   71800.00 mm LV 3D SV:    79600.00 mm  3D Volume EF: 3D EF:        52 % Riley Lam MD Electronically signed by Riley Lam MD Signature Date/Time: 04/06/2020/1:32:40 PM    Final    Assessment and Plan:   Dylan Olsen is a 64 y.o. male with a hx of vertigo, GERD, and afib who is being seen today for the evaluation of chest pain at the request of Dr. Lockie Mola.  1. Chest pain s/p afib ablation: underwent successful afib ablation yesterday with Dr. [name]. Developed chest pain this morning around 2am that is worse  with inspiration. EKG without ischemia. Suspect post procedural pericarditis.  -- echo ordered in the ED to rule out effusion -- if echo stable and pain controlled, would be ok for DC. Will arrange close follow up in the office  2. Elevated troponin: hsTn 1596 on initial draw, suspect related to ablation. He had not chest pain prior to procedure, or anginal symptoms  3. Afib s/p ablation: continue on Eliquis as long as no effusion on echo -- in SR in the ED  For questions or updates, please contact [facility] Please consult [email or web address] for contact info under    Signed, Laverda Page, NP  04/08/2020 5:15 PM As above, patient seen and examined.  Briefly he is a 64 year old male with past medical history of gastroesophageal reflux disease and status post ablation of atrial fibrillation on April 07, 2020 for evaluation of chest pain.  Prior to his ablation the patient occasionally had mild dyspnea on exertion but no exertional chest pain.  He had his procedure yesterday.  This morning at approximately 2 AM he developed pain in his left chest and neck that increased with inspiration.  There was no change with position.  No associated nausea or diaphoresis but he did describe dyspnea.  His pain has been continuous.  He presented for further evaluation.  Temperature 98.3.  He denied fevers, chills, swallowing difficulties or productive cough. Chest x-ray shows no active disease.  Troponin 1596, potassium 3.7, creatinine 0.76, hemoglobin 13.3 and white blood cell count 9.2.  Electrocardiogram shows sinus rhythm with no ST changes.  1 chest pain-symptoms are consistent with pericarditis following recent procedure.  Troponin mildly elevated likely secondary to recent ablation.  Electrocardiogram shows no ST changes.  We will check limited echocardiogram to rule out effusion.  If negative we will treat with pain medications (Tylenol 3).  His pain hopefully will resolve in the next 48 hours.  We will  have him seen back in the clinic early next week.  2 paroxysmal atrial fibrillation-status post ablation.  Continue apixaban.  3 hyperlipidemia-continue statin.  4 elevated troponin-likely secondary to recent ablation.  No plans for further ischemia evaluation.  Olga Millers, MD  Addendum: on follow up patient continues to having ongoing chest pain despite IV pain meds.   Will write to admit for pain management. If improvement in symptoms, plan for dc tomorrow  Signed, Laverda Page, NP-C 04/08/2020, 5:17 PM Pager: [phone] See consult note from same  day Olga Olsen

## 2020-04-08 NOTE — ED Notes (Signed)
Attempted to give report and was told the nurse will call me back. 

## 2020-04-08 NOTE — ED Provider Notes (Signed)
[facility] EMERGENCY DEPARTMENT Provider Note   CSN: [id number] Arrival date & time: 04/08/20  1301     History Chief Complaint  Patient presents with  . Chest Pain    Dylan Olsen is a 64 y.o. male.  The history is provided by the patient.  Chest Pain Pain location:  Substernal area Pain quality: aching   Pain radiates to:  Does not radiate Pain severity:  Moderate Onset quality:  Gradual Timing:  Constant Progression:  Unchanged Chronicity:  New Context comment:  Cardiac ablation yesterday, pain this morning.  Relieved by:  Nothing Worsened by:  Deep breathing Associated symptoms: no abdominal pain, no back pain, no cough, no fever, no palpitations, no shortness of breath and no vomiting        Past Medical History:  Diagnosis Date  . Complication of anesthesia    difficult to wake, stopped breathing, bagged  . Dysrhythmia     Patient Active Problem List   Diagnosis Date Noted  . Pericarditis 04/08/2020  . GERD (gastroesophageal reflux disease) 01/13/2020  . BPPV (benign paroxysmal positional vertigo) 01/13/2020  . Seasonal allergies 01/13/2020  . Chronic left shoulder pain 01/13/2020  . Dyspnea on exertion 01/13/2020  . Erectile dysfunction 01/13/2020  . Tachycardia 01/13/2020  . Atrial fibrillation (HCC) 01/13/2020    Past Surgical History:  Procedure Laterality Date  . BACK SURGERY    . FOOT SURGERY    . KNEE SURGERY Left        Family History  Problem Relation Age of Onset  . Heart disease Father   . Heart attack Father 58  . Cancer Cousin        unknown primary    Social History   Tobacco Use  . Smoking status: Current Every Day Smoker    Types: Cigars  . Smokeless tobacco: Never Used  . Tobacco comment: a few times a year  Vaping Use  . Vaping Use: Never used  Substance Use Topics  . Alcohol use: Yes    Comment: less than monthly  . Drug use: Yes    Types: Marijuana    Comment: a few times a week     Home Medications Prior to Admission medications   Medication Sig Start Date End Date Taking? Authorizing Provider  acetaminophen (TYLENOL) 500 MG tablet Take 1,000 mg by mouth every 6 (six) hours as needed (for pain.).    [provider]  apixaban (ELIQUIS) 5 MG TABS tablet Take 1 tablet (5 mg total) by mouth 2 (two) times daily. 03/24/20   [name], MD  atorvastatin (LIPITOR) 10 MG tablet Take 1 tablet (10 mg total) by mouth daily. 01/17/20   Lynnda Child, MD  cetirizine (ZYRTEC) 10 MG tablet Take 10 mg by mouth daily.    [provider]  famotidine (PEPCID) 20 MG tablet Take 20 mg by mouth 2 (two) times daily as needed for heartburn or indigestion.    [provider]  Meclizine HCl (BONINE PO) Take 1-2 tablets by mouth 3 (three) times daily as needed (vertigo/dizziness.). Take as needed for vertigo     [provider]  pantoprazole (PROTONIX) 40 MG tablet Take 1 tablet (40 mg total) by mouth daily. 04/07/20 05/22/20  Sheilah Pigeon, PA-C    Allergies    Other  Review of Systems   Review of Systems  Constitutional: Negative for chills and fever.  HENT: Negative for ear pain and sore throat.   Eyes:  Negative for pain and visual disturbance.  Respiratory: Negative for cough and shortness of breath.   Cardiovascular: Positive for chest pain. Negative for palpitations.  Gastrointestinal: Negative for abdominal pain and vomiting.  Genitourinary: Negative for dysuria and hematuria.  Musculoskeletal: Negative for arthralgias and back pain.  Skin: Negative for color change and rash.  Neurological: Negative for seizures and syncope.  All other systems reviewed and are negative.   Physical Exam Updated Vital Signs  ED Triage Vitals  Enc Vitals Group     BP 04/08/20 1309 (!) 179/100     Pulse Rate 04/08/20 1309 72     Resp 04/08/20 1309 15     Temp 04/08/20 1309 98.3 F (36.8 C)     Temp src --      SpO2 04/08/20 1309 99 %      Weight --      Height --      Head Circumference --      Peak Flow --      Pain Score 04/08/20 1320 8     Pain Loc --      Pain Edu? --      Excl. in GC? --     Physical Exam Vitals and nursing note reviewed.  Constitutional:      General: He is not in acute distress.    Appearance: He is well-developed. He is not ill-appearing.  HENT:     Head: Normocephalic and atraumatic.  Eyes:     Conjunctiva/sclera: Conjunctivae normal.     Pupils: Pupils are equal, round, and reactive to light.  Cardiovascular:     Rate and Rhythm: Normal rate and regular rhythm.     Pulses:          Radial pulses are 2+ on the right side and 2+ on the left side.     Heart sounds: Normal heart sounds. No murmur heard.  No friction rub.  Pulmonary:     Effort: Pulmonary effort is normal. No respiratory distress.     Breath sounds: Normal breath sounds. No decreased breath sounds or wheezing.  Abdominal:     Palpations: Abdomen is soft.     Tenderness: There is no abdominal tenderness.  Musculoskeletal:     Cervical back: Normal range of motion and neck supple.     Right lower leg: No edema.     Left lower leg: No edema.  Skin:    General: Skin is warm and dry.     Capillary Refill: Capillary refill takes less than 2 seconds.  Neurological:     General: No focal deficit present.     Mental Status: He is alert.     ED Results / Procedures / Treatments   Labs (all labs ordered are listed, but only abnormal results are displayed) Labs Reviewed  BASIC METABOLIC PANEL - Abnormal; Notable for the following components:      Result Value   Glucose, Bld 106 (*)    All other components within normal limits  TROPONIN I (HIGH SENSITIVITY) - Abnormal; Notable for the following components:   Troponin I (High Sensitivity) 1,596 (*)    All other components within normal limits  TROPONIN I (HIGH SENSITIVITY) - Abnormal; Notable for the following components:   Troponin I (High Sensitivity) 1,382 (*)     All other components within normal limits  CBC    EKG EKG Interpretation  Date/Time:  Saturday April 08 2020 13:01:39 EDT Ventricular Rate:  71 PR Interval:  196 QRS  Duration: 116 QT Interval:  388 QTC Calculation: 421 R Axis:   10 Text Interpretation: Normal sinus rhythm RSR' or QR pattern in V1 suggests right ventricular conduction delay Borderline ECG Confirmed by Virgina Norfolk [phone]) on 04/08/2020 2:57:15 PM   Radiology DG Chest 2 View  Result Date: 04/08/2020 CLINICAL DATA:  Chest pain. EXAM: CHEST - 2 VIEW COMPARISON:  December 10, 2006. FINDINGS: The heart size and mediastinal contours are within normal limits. No pneumothorax or pleural effusion is noted. Stable calcified granuloma is noted in right lung base. No acute pulmonary disease is noted. The visualized skeletal structures are unremarkable. IMPRESSION: No active cardiopulmonary disease. Electronically Signed   By: Lupita Raider M.D.   On: 04/08/2020 13:59   ECHOCARDIOGRAM LIMITED  Result Date: 04/08/2020    ECHOCARDIOGRAM REPORT   [name] Date of Exam: 04/08/2020 Medical Rec #:  [id number]        Height:       71.0 in Accession #:    [id number]       Weight:       199.0 lb Date of Birth:  1955-11-08        BSA:          2.104 m Patient Age:    64 years         BP:           159/95 mmHg Patient Gender: M                HR:           79 bpm. Exam Location:  Inpatient Procedure: Limited Echo and Color Doppler Indications:    Chest pain  History:        Patient has prior history of Echocardiogram examinations, most                 recent 04/06/2020. Arrythmias:Atrial Fibrillation;                 Signs/Symptoms:Shortness of Breath. S/p ablation. GERD.  Sonographer:    Ross Ludwig RDCS (AE) Referring Phys: [phone] [facility]  Sonographer Comments: Suboptimal subcostal window. IMPRESSIONS  1. Limited study to R/O pericardial effusion; no effusion noted.  2. Left ventricular ejection fraction, by estimation,  is 50 to 55%. The left ventricle has low normal function. The left ventricle has no regional wall motion abnormalities. There is moderate left ventricular hypertrophy. Left ventricular diastolic function could not be evaluated.  3. Right ventricular systolic function is normal. The right ventricular size is normal.  4. The mitral valve is normal in structure. Trivial mitral valve regurgitation. No evidence of mitral stenosis.  5. The aortic valve is tricuspid. Aortic valve regurgitation is not visualized. Mild aortic valve sclerosis is present, with no evidence of aortic valve stenosis.  6. The inferior vena cava is normal in size with greater than 50% respiratory variability, suggesting right atrial pressure of 3 mmHg. FINDINGS  Left Ventricle: Left ventricular ejection fraction, by estimation, is 50 to 55%. The left ventricle has low normal function. The left ventricle has no regional wall motion abnormalities. The left ventricular internal cavity size was normal in size. There is moderate left ventricular hypertrophy. Left ventricular diastolic function could not be evaluated. Right Ventricle: The right ventricular size is normal. Right ventricular systolic function is normal. Left Atrium: Left atrial size was normal in size. Right Atrium: Right atrial size was normal in size. Pericardium: There is no  evidence of pericardial effusion. Mitral Valve: The mitral valve is normal in structure. Trivial mitral valve regurgitation. No evidence of mitral valve stenosis. Tricuspid Valve: The tricuspid valve is normal in structure. Tricuspid valve regurgitation is trivial. No evidence of tricuspid stenosis. Aortic Valve: The aortic valve is tricuspid. Aortic valve regurgitation is not visualized. Mild aortic valve sclerosis is present, with no evidence of aortic valve stenosis. Pulmonic Valve: The pulmonic valve was not well visualized. Pulmonic valve regurgitation is not visualized. No evidence of pulmonic stenosis. Aorta:  The aortic root is normal in size and structure. Venous: The inferior vena cava is normal in size with greater than 50% respiratory variability, suggesting right atrial pressure of 3 mmHg.  Additional Comments: Limited study to R/O pericardial effusion; no effusion noted.  IVC IVC diam: 2.00 cm Olga Millers MD Electronically signed by Olga Millers MD Signature Date/Time: 04/08/2020/5:18:39 PM    Final     Procedures .Critical Care Performed by: Virgina Norfolk, DO Authorized by: Virgina Norfolk, DO   Critical care provider statement:    Critical care time (minutes):  35   Critical care was necessary to treat or prevent imminent or life-threatening deterioration of the following conditions: pericarditits with trop elevation.   Critical care was time spent personally by me on the following activities:  Blood draw for specimens, development of treatment plan with patient or surrogate, discussions with primary provider, evaluation of patient's response to treatment, examination of patient, obtaining history from patient or surrogate, ordering and performing treatments and interventions, ordering and review of laboratory studies, ordering and review of radiographic studies, pulse oximetry, re-evaluation of patient's condition and review of old charts   I assumed direction of critical care for this patient from another provider in my specialty: no     (including critical care time)  Medications Ordered in ED Medications  fentaNYL (SUBLIMAZE) injection 25 mcg (has no administration in time range)  fentaNYL (SUBLIMAZE) injection 25 mcg (25 mcg Intravenous Given 04/08/20 1548)    ED Course  I have reviewed the triage vital signs and the nursing notes.  Pertinent labs & imaging results that were available during my care of the patient were reviewed by me and considered in my medical decision making (see chart for details).    MDM Rules/Calculators/A&P                          Nathen Balaban Ten Kandice Olsen is  a 64 year old male with history of atrial fibrillation on Eliquis who presents to the ED with chest pain.  Normal vitals.  No fever.  Had cardiac ablation yesterday.  Was doing well but woke up in the middle night with chest pain worse with inspiration and movement.  No obvious friction rub on exam.  No signs of volume overload.  Overall clear breath sounds.  Lab work ready collected prior to my evaluation and patient with troponin of 1500.  Chest x-ray with no signs of infection, no pneumothorax.  No significant anemia, electrolyte antibiotic, kidney injury.  EKG shows sinus rhythm.  No obvious ischemic changes.  Overall likely pericarditis post procedure.  Will give IV fentanyl for pain management and repeat troponin.  Cardiology has been consulted.  Troponin stable at 1382.  Echocardiogram also was unremarkable but given that patient was having persistent pain and actually his pain is worsening to be admitted to cardiology for further pain control.  This chart was dictated using voice recognition software.  Despite best  efforts to proofread,  errors can occur which can change the documentation meaning.    Final Clinical Impression(s) / ED Diagnoses Final diagnoses:  Acute pericarditis, unspecified type    Rx / DC Orders ED Discharge Orders    None       Virgina NorfolkCuratolo, Aashvi Rezabek, DO 04/08/20 1733

## 2020-04-08 NOTE — ED Notes (Signed)
Troponin 1,596

## 2020-04-08 NOTE — ED Triage Notes (Addendum)
Pt states he had afib ablation yesterday.  Reports throat pain that radiates to L chest and L shoulder since this morning with SOB.  States it is hard to take a deep breath.

## 2020-04-08 NOTE — Progress Notes (Signed)
  Echocardiogram 2D Echocardiogram has been performed.  Gerda Diss 04/08/2020, 5:09 PM

## 2020-04-08 NOTE — Consult Note (Addendum)
Cardiology Consultation:   Patient ID: Dylan Olsen MRN: [id number]; DOB: 08-14-1955  Admit date: 04/08/2020 Date of Consult: 04/08/2020  Primary Care Provider: Lynnda Child, MD [facility] Cardiologist: No primary care provider on file.  [facility] Electrophysiologist:  [name], MD    Patient Profile:   Dylan Olsen is a 64 y.o. male with a hx of vertigo, GERD, and afib who is being seen today for the evaluation of chest pain at the request of Dr. Lockie Mola.  History of Present Illness:   Mr. Dylan Olsen is a 64 yo male with PMH of vertigo, GERD and afib who is followed by Dr. [name] as an outpatient. He was seen back on 03/06/20 in the office. Noted indicate he had a recent echo that showed a EF of 40-45% with global hypokinesis, normal RV and mildly dilated left atria. Options were discussed in the office regarding pharmacologic treatment vs ablation. He elected to pursue ablation. He was placed on Eliquis 3 weeks prior to procedure. Presented on 10/1 and underwent successful ablatio of posterior wall with no early complications noted.   Presented to the ED today with reports of chest pain that started about 2am this morning. Worse with inspiration, and laying flat. Labs showed stable electrolytes, Cr 0.76, hsTn 1596, WBC 9.2, Hgb 13.3. CXR negative. EKG showed SR 71bpm, no ischemia noted. Echo ordered in the ED   Past Medical History:  Diagnosis Date  . Complication of anesthesia    difficult to wake, stopped breathing, bagged  . Dysrhythmia     Past Surgical History:  Procedure Laterality Date  . BACK SURGERY    . FOOT SURGERY    . KNEE SURGERY Left      Home Medications:  Prior to Admission medications   Medication Sig Start Date End Date Taking? Authorizing Provider  acetaminophen (TYLENOL) 500 MG tablet Take 1,000 mg by mouth every 6 (six) hours as needed (for pain.).    [provider]  apixaban (ELIQUIS) 5 MG TABS tablet Take 1 tablet  (5 mg total) by mouth 2 (two) times daily. 03/24/20   [name], MD  atorvastatin (LIPITOR) 10 MG tablet Take 1 tablet (10 mg total) by mouth daily. 01/17/20   Lynnda Child, MD  cetirizine (ZYRTEC) 10 MG tablet Take 10 mg by mouth daily.    [provider]  famotidine (PEPCID) 20 MG tablet Take 20 mg by mouth 2 (two) times daily as needed for heartburn or indigestion.    [provider]  Meclizine HCl (BONINE PO) Take 1-2 tablets by mouth 3 (three) times daily as needed (vertigo/dizziness.). Take as needed for vertigo     [provider]  pantoprazole (PROTONIX) 40 MG tablet Take 1 tablet (40 mg total) by mouth daily. 04/07/20 05/22/20  Sheilah Pigeon, PA-C    Inpatient Medications: Scheduled Meds: . fentaNYL (SUBLIMAZE) injection  50 mcg Intravenous Once   Continuous Infusions:  PRN Meds:   Allergies:    Allergies  Allergen Reactions  . Other Other (See Comments)    Pt reports he "coded after having anesthesia with a back surgery"  Can not recall what medicine    Social History:   Social History   Socioeconomic History  . Marital status: Married    Spouse name: Dylan Olsen  . Number of children: 2  . Years of education: high school  . Highest education level: Not on file  Occupational History  . Occupation: Curator  Tobacco Use  . Smoking status: Current Every Day Smoker    Types: Cigars  . Smokeless tobacco: Never Used  . Tobacco comment: a few times a year  Vaping Use  . Vaping Use: Never used  Substance and Sexual Activity  . Alcohol use: Yes    Comment: less than monthly  . Drug use: Yes    Types: Marijuana    Comment: a few times a week  . Sexual activity: Yes    Birth control/protection: Post-menopausal  Other Topics Concern  . Not on file  Social History Narrative   01/13/20   From: [place], [place] originally - moved to [place] 1996   Living: with wife [name] (1996), and step daughter (special needs)   Work: English as a second language teacher for  [employer]      Family: 2 children - Dylan Olsen and Dylan Olsen - in [place], good relationship and 3 grandchildren and 2 step children      Enjoys: drag racing, football      Exercise: not currently - walking and mobile job   Diet: not great, sometimes a little better      Safety   Seat belts: Yes    Guns: Yes  and secure   Safe in relationships: Yes    Social Determinants of Health   Financial Resource Strain:   . Difficulty of Paying Living Expenses: Not on file  Food Insecurity:   . Worried About Programme researcher, broadcasting/film/video in the Last Year: Not on file  . Ran Out of Food in the Last Year: Not on file  Transportation Needs:   . Lack of Transportation (Medical): Not on file  . Lack of Transportation (Non-Medical): Not on file  Physical Activity:   . Days of Exercise per Week: Not on file  . Minutes of Exercise per Session: Not on file  Stress:   . Feeling of Stress : Not on file  Social Connections:   . Frequency of Communication with Friends and Family: Not on file  . Frequency of Social Gatherings with Friends and Family: Not on file  . Attends Religious Services: Not on file  . Active Member of Clubs or Organizations: Not on file  . Attends Banker Meetings: Not on file  . Marital Status: Not on file  Intimate Partner Violence:   . Fear of Current or Ex-Partner: Not on file  . Emotionally Abused: Not on file  . Physically Abused: Not on file  . Sexually Abused: Not on file    Family History:    Family History  Problem Relation Age of Onset  . Heart disease Father   . Heart attack Father 30  . Cancer Cousin        unknown primary     ROS:  Please see the history of present illness.   All other ROS reviewed and negative.     Physical Exam/Data:   Vitals:   04/08/20 1309 04/08/20 1412 04/08/20 1456  BP: (!) 179/100 (!) 158/95 (!) 155/96  Pulse: 72 76 83  Resp: 15 16 16   Temp: 98.3 F (36.8 C)    SpO2: 99% 100% 99%   No intake or output data in the  24 hours ending 04/08/20 1512 Last 3 Weights 04/07/2020 04/06/2020 03/06/2020  Weight (lbs) 192 lb 190 lb 203 lb 3.2 oz  Weight (kg) 87.091 kg 86.183 kg 92.171 kg     There is no height or weight on file to calculate BMI.  General:  Well nourished, well  developed, in no acute distress HEENT: normal Lymph: no adenopathy Neck: no JVD Endocrine:  No thryomegaly Vascular: No carotid bruits Cardiac:  normal S1, S2; RRR; no murmur  Lungs:  clear to auscultation bilaterally, no wheezing, rhonchi or rales  Abd: soft, nontender, no hepatomegaly  Ext: no edema Musculoskeletal:  No deformities, BUE and BLE strength normal and equal Skin: warm and dry  Neuro:  CNs 2-12 intact, no focal abnormalities noted Psych:  Normal affect   EKG:  The EKG was personally reviewed and demonstrates:  SR 71bpm, no acute ST/T wave changes  Relevant CV Studies:  Echo: 02/2020  IMPRESSIONS    1. Rhythm concerning for atrial fibrillation  2. Left ventricular ejection fraction, by estimation, is 40 to 45%. The  left ventricle has mildly decreased function. The left ventricle  demonstrates global hypokinesis. Left ventricular diastolic parameters are  indeterminate.  3. Right ventricular systolic function is normal. The right ventricular  size is normal. There is normal pulmonary artery systolic pressure. The  estimated right ventricular systolic pressure is 26.0 mmHg.  4. Left atrial size was mildly dilated.  5. Mild mitral valve regurgitation.   Laboratory Data:  High Sensitivity Troponin:   Recent Labs  Lab 04/08/20 1326  TROPONINIHS 1,596*     Chemistry Recent Labs  Lab 04/08/20 1326  NA 139  K 3.7  CL 102  CO2 27  GLUCOSE 106*  BUN 13  CREATININE 0.76  CALCIUM 8.9  GFRNONAA >60  GFRAA >60  ANIONGAP 10    No results for input(s): PROT, ALBUMIN, AST, ALT, ALKPHOS, BILITOT in the last 168 hours. Hematology Recent Labs  Lab 04/08/20 1326  WBC 9.2  RBC 4.37  HGB 13.3  HCT 39.6    MCV 90.6  MCH 30.4  MCHC 33.6  RDW 12.0  PLT 168   BNPNo results for input(s): BNP, PROBNP in the last 168 hours.  DDimer No results for input(s): DDIMER in the last 168 hours.   Radiology/Studies:  DG Chest 2 View  Result Date: 04/08/2020 CLINICAL DATA:  Chest pain. EXAM: CHEST - 2 VIEW COMPARISON:  December 10, 2006. FINDINGS: The heart size and mediastinal contours are within normal limits. No pneumothorax or pleural effusion is noted. Stable calcified granuloma is noted in right lung base. No acute pulmonary disease is noted. The visualized skeletal structures are unremarkable. IMPRESSION: No active cardiopulmonary disease. Electronically Signed   By: Lupita RaiderJames  Green Jr M.D.   On: 04/08/2020 13:59   ECHO TEE  Result Date: 04/06/2020    TRANSESOPHOGEAL ECHO REPORT   Patient Name:   Linard MillersRANDY L TEN EYCK Date of Exam: 04/06/2020 Medical Rec #:  [id number]        Height:       71.0 in Accession #:    [id number]       Weight:       190.0 lb Date of Birth:  [birth year]        BSA:          2.063 m Patient Age:    64 years         BP:           174/137 mmHg Patient Gender: M                HR:           105 bpm. Exam Location:  Inpatient Procedure: Transesophageal Echo, 3D Echo, Color Doppler, Cardiac Doppler and  Saline Contrast Bubble Study Indications:     I48.91* Unspecified atrial fibrillation  History:         Patient has prior history of Echocardiogram examinations, most                  recent 02/18/2020. Arrythmias:Atrial Fibrillation.  Sonographer:     Irving Burton Senior RDCS Referring Phys:  [id number] [facility] A Izora Ribas Diagnosing Phys: Riley Lam MD PROCEDURE: After discussion of the risks and benefits of a TEE, an informed consent was obtained from the patient. The transesophogeal probe was passed without difficulty through the esophogus of the patient. Local oropharyngeal anesthetic was provided with Cetacaine. Sedation performed by different physician. The patient was monitored while  under deep sedation. Anesthestetic sedation was provided intravenously by Anesthesiology: 352mg  of Propofol, 40mg  of Lidocaine. The patient developed no complications during the procedure. IMPRESSIONS  1. Left ventricular ejection fraction, by estimation, is 50 to 55%. Left ventricular ejection fraction by 3D volume is 52 %. The left ventricle has low normal function. The left ventricle has no regional wall motion abnormalities. Left ventricular diastolic function could not be evaluated.  2. Right ventricular systolic function is normal. The right ventricular size is normal.  3. Left atrial size was dilated. No left atrial/left atrial appendage thrombus was detected. The LAA emptying velocity was 30 cm/s.  4. The mitral valve is normal in structure. Trivial mitral valve regurgitation.  5. The aortic valve is tricuspid. Aortic valve regurgitation is not visualized.  6. There is mild (Grade II) plaque involving the transverse aorta and descending aorta.  7. Evidence of atrial level shunting detected by color flow Doppler. There is evidence of PFO with baseline Color Doppler L-R Shunt. With Valsalva Bubble study, small volume evidence of R-> L shunt.. There is a patent foramen ovale with predominantly left to right shunting across the atrial septum. Conclusion(s)/Recommendation(s): No evidence of LAA Thrombus. PFO noted with L-R shunt. FINDINGS  Left Ventricle: Left ventricular ejection fraction, by estimation, is 50 to 55%. Left ventricular ejection fraction by 3D volume is 52 %. The left ventricle has low normal function. The left ventricle has no regional wall motion abnormalities. The left ventricular internal cavity size was normal in size. There is no left ventricular hypertrophy. Left ventricular diastolic function could not be evaluated. Right Ventricle: The right ventricular size is normal. Right vetricular wall thickness was not assessed. Right ventricular systolic function is normal. Left Atrium: Left atrial  size was dilated. No left atrial/left atrial appendage thrombus was detected. The LAA emptying velocity was 30 cm/s. Right Atrium: Right atrial size was normal in size. Pericardium: There is no evidence of pericardial effusion. Mitral Valve: The mitral valve is normal in structure. Trivial mitral valve regurgitation. Tricuspid Valve: The tricuspid valve is normal in structure. Tricuspid valve regurgitation is not demonstrated. Aortic Valve: The aortic valve is tricuspid. Aortic valve regurgitation is not visualized. Pulmonic Valve: The pulmonic valve was normal in structure. Pulmonic valve regurgitation is trivial. Aorta: The aortic root and ascending aorta are structurally normal, with no evidence of dilitation. There is mild (Grade II) plaque involving the transverse aorta and descending aorta. Venous: One solitary beat of systolic flow reversal, otherwise normal wave forms. IAS/Shunts: Evidence of atrial level shunting detected by color flow Doppler. Agitated saline contrast was given intravenously to evaluate for intracardiac shunting. There is evidence of PFO with baseline Color Doppler L-R Shunt. With Valsalva Bubble study, small volume evidence of R-> L shunt.   3D Volume  EF LV 3D EF:    Left ventricular ejection fraction by 3D volume is 52              %.  3D Volume EF LV 3D EF:    52.60 % LV 3D EDV:   151400.00 mm LV 3D ESV:   71800.00 mm LV 3D SV:    79600.00 mm  3D Volume EF: 3D EF:        52 % Riley Lam MD Electronically signed by Riley Lam MD Signature Date/Time: 04/06/2020/1:32:40 PM    Final    Assessment and Plan:   Peggye Form is a 64 y.o. male with a hx of vertigo, GERD, and afib who is being seen today for the evaluation of chest pain at the request of Dr. Lockie Mola.  1. Chest pain s/p afib ablation: underwent successful afib ablation yesterday with Dr. [name]. Developed chest pain this morning around 2am that is worse with inspiration. EKG without ischemia.  Suspect post procedural pericarditis.  -- echo ordered in the ED to rule out effusion -- if echo stable and pain controlled, would be ok for DC. Will arrange close follow up in the office  2. Elevated troponin: hsTn 1596 on initial draw, suspect related to ablation. He had not chest pain prior to procedure, or anginal symptoms  3. Afib s/p ablation: continue on Eliquis as long as no effusion on echo -- in SR in the ED  For questions or updates, please contact [facility] Please consult [email or web address] for contact info under    Signed, Laverda Page, NP  04/08/2020 3:12 PM As above, patient seen and examined.  Briefly he is a 64 year old male with past medical history of gastroesophageal reflux disease and status post ablation of atrial fibrillation on April 07, 2020 for evaluation of chest pain.  Prior to his ablation the patient occasionally had mild dyspnea on exertion but no exertional chest pain.  He had his procedure yesterday.  This morning at approximately 2 AM he developed pain in his left chest and neck that increased with inspiration.  There was no change with position.  No associated nausea or diaphoresis but he did describe dyspnea.  His pain has been continuous.  He presented for further evaluation.  Temperature 98.3.  He denied fevers, chills, swallowing difficulties or productive cough. Chest x-ray shows no active disease.  Troponin 1596, potassium 3.7, creatinine 0.76, hemoglobin 13.3 and white blood cell count 9.2.  Electrocardiogram shows sinus rhythm with no ST changes.  1 chest pain-symptoms are consistent with pericarditis following recent procedure.  Troponin mildly elevated likely secondary to recent ablation.  Electrocardiogram shows no ST changes.  We will check limited echocardiogram to rule out effusion.  If negative we will treat with pain medications (Tylenol 3).  His pain hopefully will resolve in the next 48 hours.  We will have him seen back in the clinic early  next week.  2 paroxysmal atrial fibrillation-status post ablation.  Continue apixaban.  3 hyperlipidemia-continue statin.  4 elevated troponin-likely secondary to recent ablation.  No plans for further ischemia evaluation.  Olga Millers, MD

## 2020-04-09 ENCOUNTER — Encounter (HOSPITAL_COMMUNITY): Payer: Self-pay | Admitting: Cardiology

## 2020-04-09 DIAGNOSIS — I300 Acute nonspecific idiopathic pericarditis: Secondary | ICD-10-CM

## 2020-04-09 DIAGNOSIS — E782 Mixed hyperlipidemia: Secondary | ICD-10-CM

## 2020-04-09 HISTORY — DX: Mixed hyperlipidemia: E78.2

## 2020-04-09 LAB — CBC
HCT: 34.8 % — ABNORMAL LOW (ref 39.0–52.0)
Hemoglobin: 11.8 g/dL — ABNORMAL LOW (ref 13.0–17.0)
MCH: 30.6 pg (ref 26.0–34.0)
MCHC: 33.9 g/dL (ref 30.0–36.0)
MCV: 90.4 fL (ref 80.0–100.0)
Platelets: 143 10*3/uL — ABNORMAL LOW (ref 150–400)
RBC: 3.85 MIL/uL — ABNORMAL LOW (ref 4.22–5.81)
RDW: 12.1 % (ref 11.5–15.5)
WBC: 7.4 10*3/uL (ref 4.0–10.5)
nRBC: 0 % (ref 0.0–0.2)

## 2020-04-09 MED ORDER — ACETAMINOPHEN-CODEINE #3 300-30 MG PO TABS
2.0000 | ORAL_TABLET | Freq: Three times a day (TID) | ORAL | 0 refills | Status: DC | PRN
Start: 2020-04-09 — End: 2020-05-05

## 2020-04-09 MED ORDER — ACETAMINOPHEN-CODEINE #3 300-30 MG PO TABS
2.0000 | ORAL_TABLET | Freq: Three times a day (TID) | ORAL | 0 refills | Status: DC | PRN
Start: 2020-04-09 — End: 2020-04-09

## 2020-04-09 NOTE — Discharge Summary (Addendum)
Discharge Summary    Patient ID: Dylan Olsen MRN: [id number]; DOB: Aug 10, 1955  Admit date: 04/08/2020 Discharge date: 04/09/2020  Primary Care Provider: Lynnda Child, MD  Primary Cardiologist: No primary care provider on file.  Primary Electrophysiologist:  [name], MD   Discharge Diagnoses    Principal Problem:   Pericarditis Active Problems:   Mixed hyperlipidemia   Persistent Atrial Fibrillation s/p catheter ablation   GERD  Diagnostic Studies/Procedures    2D Echocardiogram 10.2.2021  1. Limited study to R/O pericardial effusion; no effusion noted.   2. Left ventricular ejection fraction, by estimation, is 50 to 55%. The  left ventricle has low normal function. The left ventricle has no regional  wall motion abnormalities. There is moderate left ventricular hypertrophy.  Left ventricular diastolic  function could not be evaluated.   3. Right ventricular systolic function is normal. The right ventricular  size is normal.   4. The mitral valve is normal in structure. Trivial mitral valve  regurgitation. No evidence of mitral stenosis.   5. The aortic valve is tricuspid. Aortic valve regurgitation is not  visualized. Mild aortic valve sclerosis is present, with no evidence of  aortic valve stenosis.   6. The inferior vena cava is normal in size with greater than 50%  respiratory variability, suggesting right atrial pressure of 3 mmHg.  _____________   History of Present Illness     Dylan Olsen is a 64 y.o. male with a history of persistent atrial fibrillation, vertigo, GERD, and hyperlipidemia.  With relatively recent diagnosis of atrial fibrillation, and finding of reduced ejection fraction of 40-45% with global hypokinesis by echocardiogram in August 2021, patient was evaluated by electrophysiology and was subsequently placed on Eliquis and then underwent TEE followed by successful catheter ablation for atrial fibrillation on October 1.  Patient  was discharged the same day.  Unfortunately, in the early morning hours of October 2, he began to experience chest pain that was worse with lying flat and deep inspiration.  Presented to the emergency department where his troponin was elevated at 1596.  Blood work was otherwise unremarkable.  Chest x-ray did not show any active cardiopulmonary disease.  ECG demonstrated sinus rhythm without acute ST or T changes.  He was admitted for further evaluation of pericarditis.  Hospital Course     Consultants: None  Following admission, limited echocardiogram was performed and showed an EF of 50-55% without regional wall motion abnormalities or evidence of a pericardial effusion.  Troponin was downtrending at 1382 and was felt to be secondary to recent ablation.  Tylenol 3 was added to his regimen with some improvement in chest discomfort.  He will be discharged home today in good condition and we will arrange for early electrophysiology follow-up within the next 7 to 10 days.  Did the patient have an acute coronary syndrome (MI, NSTEMI, STEMI, etc) this admission?:  No                               Did the patient have a percutaneous coronary intervention (stent / angioplasty)?:  No.   _____________  Discharge Vitals Blood pressure 136/84, pulse 73, temperature 97.7 F (36.5 C), temperature source Oral, resp. rate 19, height 5\' 11"  (1.803 m), weight 90.3 kg, SpO2 96 %.  Filed Weights   04/08/20 1552  Weight: 90.3 kg    Labs & Radiologic Studies    CBC Recent  Labs    04/08/20 1326 04/09/20 0234  WBC 9.2 7.4  HGB 13.3 11.8*  HCT 39.6 34.8*  MCV 90.6 90.4  PLT 168 143*   Basic Metabolic Panel Recent Labs    07/37/10 1326  NA 139  K 3.7  CL 102  CO2 27  GLUCOSE 106*  BUN 13  CREATININE 0.76  CALCIUM 8.9   High Sensitivity Troponin:   Recent Labs  Lab 04/08/20 1326 04/08/20 1551  TROPONINIHS 1,596* 1,382*    _____________  DG Chest 2 View  Result Date: 04/08/2020 CLINICAL  DATA:  Chest pain. EXAM: CHEST - 2 VIEW COMPARISON:  December 10, 2006. FINDINGS: The heart size and mediastinal contours are within normal limits. No pneumothorax or pleural effusion is noted. Stable calcified granuloma is noted in right lung base. No acute pulmonary disease is noted. The visualized skeletal structures are unremarkable. IMPRESSION: No active cardiopulmonary disease. Electronically Signed   By: Lupita Raider M.D.   On: 04/08/2020 13:59   Disposition   Pt is being discharged home today in good condition.  Follow-up Plans & Appointments     Follow-up Information    [name], MD Follow up in 1 week(s).   Specialty: Cardiology Why: We will arrange for follow-up with Dr. [name] and contact you. Contact information: [address] [place] [address] [phone]              Discharge Instructions    Call MD for:  difficulty breathing, headache or visual disturbances   Complete by: As directed    Call MD for:  redness, tenderness, or signs of infection (pain, swelling, redness, odor or green/yellow discharge around incision site)   Complete by: As directed    Call MD for:  severe uncontrolled pain   Complete by: As directed    Diet - low sodium heart healthy   Complete by: As directed    Increase activity slowly   Complete by: As directed       Discharge Medications   Allergies as of 04/09/2020      Reactions   Other Other (See Comments)   Pt reports he "coded after having anesthesia with a back surgery"  Can not recall what medicine      Medication List    STOP taking these medications   famotidine 20 MG tablet Commonly known as: PEPCID     TAKE these medications   acetaminophen 500 MG tablet Commonly known as: TYLENOL Take 1,000 mg by mouth every 6 (six) hours as needed (for pain.).   acetaminophen-codeine 300-30 MG tablet Commonly known as: TYLENOL #3 Take 2 tablets by mouth every 8 (eight) hours as needed for moderate  pain.   apixaban 5 MG Tabs tablet Commonly known as: Eliquis Take 1 tablet (5 mg total) by mouth 2 (two) times daily.   atorvastatin 10 MG tablet Commonly known as: LIPITOR Take 1 tablet (10 mg total) by mouth daily.   BENADRYL EX Apply 1 application topically 2 (two) times daily as needed (rash/itching).   BONINE PO Take 1 tablet by mouth 2 (two) times daily as needed (vertigo/dizziness.). Take as needed for vertigo   cetirizine 10 MG tablet Commonly known as: ZYRTEC Take 10 mg by mouth daily.   pantoprazole 40 MG tablet Commonly known as: Protonix Take 1 tablet (40 mg total) by mouth daily.       Outstanding Labs/Studies   None  Duration of Discharge Encounter   Greater than  30 minutes including physician time.  Signed, Nicolasa Ducking, NP 04/09/2020, 9:15 AM

## 2020-04-09 NOTE — H&P (View-Only) (Signed)
 Progress Note  [name] Date of Encounter: 04/09/2020  [facility] Cardiologist: Dr Lambert  Subjective   Continues with CP but improved; mild dyspnea  Inpatient Medications    Scheduled Meds: . apixaban  5 mg Oral BID  . atorvastatin  10 mg Oral Daily  . pantoprazole  40 mg Oral Daily   Continuous Infusions:  PRN Meds: acetaminophen, acetaminophen-codeine, famotidine, ondansetron (ZOFRAN) IV   Vital Signs    Vitals:   04/08/20 1809 04/08/20 1947 04/08/20 2355 04/09/20 0413  BP: (!) 156/97 126/79 122/80 136/84  Pulse: 78 84 72 73  Resp: 19 18 18 19  Temp:  98.4 F (36.9 C) 98.4 F (36.9 C) 97.7 F (36.5 C)  TempSrc:  Oral Oral Oral  SpO2: 98% 96% 97% 96%  Weight:      Height:        Intake/Output Summary (Last 24 hours) at 04/09/2020 0745 Last data filed at 04/09/2020 0646 Gross per 24 hour  Intake --  Output 300 ml  Net -300 ml   Last 3 Weights 04/08/2020 04/07/2020 04/06/2020  Weight (lbs) 199 lb 192 lb 190 lb  Weight (kg) 90.266 kg 87.091 kg 86.183 kg      Telemetry    Sinus- Personally Reviewed  Physical Exam   GEN: No acute distress.   Neck: No JVD Cardiac: RRR, no murmurs, rubs, or gallops.  Respiratory: Clear to auscultation bilaterally. GI: Soft, nontender, non-distended  MS: No edema Neuro:  Nonfocal  Psych: Normal affect   Labs    High Sensitivity Troponin:   Recent Labs  Lab 04/08/20 1326 04/08/20 1551  TROPONINIHS 1,596* 1,382*      Chemistry Recent Labs  Lab 04/08/20 1326  NA 139  K 3.7  CL 102  CO2 27  GLUCOSE 106*  BUN 13  CREATININE 0.76  CALCIUM 8.9  GFRNONAA >60  GFRAA >60  ANIONGAP 10     Hematology Recent Labs  Lab 04/08/20 1326 04/09/20 0234  WBC 9.2 7.4  RBC 4.37 3.85*  HGB 13.3 11.8*  HCT 39.6 34.8*  MCV 90.6 90.4  MCH 30.4 30.6  MCHC 33.6 33.9  RDW 12.0 12.1  PLT 168 143*    Radiology    DG Chest 2 View  Result Date: 04/08/2020 CLINICAL DATA:  Chest pain.  EXAM: CHEST - 2 VIEW COMPARISON:  December 10, 2006. FINDINGS: The heart size and mediastinal contours are within normal limits. No pneumothorax or pleural effusion is noted. Stable calcified granuloma is noted in right lung base. No acute pulmonary disease is noted. The visualized skeletal structures are unremarkable. IMPRESSION: No active cardiopulmonary disease. Electronically Signed   By: James  Green Jr M.D.   On: 04/08/2020 13:59   ECHOCARDIOGRAM LIMITED  Result Date: 04/08/2020    ECHOCARDIOGRAM REPORT   [name] Date of Exam: 04/08/2020 Medical Rec #:  [id number]        Height:       71.0 in Accession #:    [id number]       Weight:       199.0 lb Date of Birth:  01/14/1956        BSA:          2.104 m Patient Age:    64 years         BP:           159/95 mmHg Patient Gender: M                  HR:           79 bpm. Exam Location:  Inpatient Procedure: Limited Echo and Color Doppler Indications:    Chest pain  History:        Patient has prior history of Echocardiogram examinations, most                 recent 04/06/2020. Arrythmias:Atrial Fibrillation;                 Signs/Symptoms:Shortness of Breath. S/p ablation. GERD.  Sonographer:    Arthur Guy RDCS (AE) Referring Phys: [id number] LINDSAY B ROBERTS  Sonographer Comments: Suboptimal subcostal window. IMPRESSIONS  1. Limited study to R/O pericardial effusion; no effusion noted.  2. Left ventricular ejection fraction, by estimation, is 50 to 55%. The left ventricle has low normal function. The left ventricle has no regional wall motion abnormalities. There is moderate left ventricular hypertrophy. Left ventricular diastolic function could not be evaluated.  3. Right ventricular systolic function is normal. The right ventricular size is normal.  4. The mitral valve is normal in structure. Trivial mitral valve regurgitation. No evidence of mitral stenosis.  5. The aortic valve is tricuspid. Aortic valve regurgitation is not visualized. Mild  aortic valve sclerosis is present, with no evidence of aortic valve stenosis.  6. The inferior vena cava is normal in size with greater than 50% respiratory variability, suggesting right atrial pressure of 3 mmHg. FINDINGS  Left Ventricle: Left ventricular ejection fraction, by estimation, is 50 to 55%. The left ventricle has low normal function. The left ventricle has no regional wall motion abnormalities. The left ventricular internal cavity size was normal in size. There is moderate left ventricular hypertrophy. Left ventricular diastolic function could not be evaluated. Right Ventricle: The right ventricular size is normal. Right ventricular systolic function is normal. Left Atrium: Left atrial size was normal in size. Right Atrium: Right atrial size was normal in size. Pericardium: There is no evidence of pericardial effusion. Mitral Valve: The mitral valve is normal in structure. Trivial mitral valve regurgitation. No evidence of mitral valve stenosis. Tricuspid Valve: The tricuspid valve is normal in structure. Tricuspid valve regurgitation is trivial. No evidence of tricuspid stenosis. Aortic Valve: The aortic valve is tricuspid. Aortic valve regurgitation is not visualized. Mild aortic valve sclerosis is present, with no evidence of aortic valve stenosis. Pulmonic Valve: The pulmonic valve was not well visualized. Pulmonic valve regurgitation is not visualized. No evidence of pulmonic stenosis. Aorta: The aortic root is normal in size and structure. Venous: The inferior vena cava is normal in size with greater than 50% respiratory variability, suggesting right atrial pressure of 3 mmHg.  Additional Comments: Limited study to R/O pericardial effusion; no effusion noted.  IVC IVC diam: 2.00 cm Kaithlyn Teagle MD Electronically signed by Verlie Liotta MD Signature Date/Time: 04/08/2020/5:18:39 PM    Final     Patient Profile     64-year-old male with past medical history of gastroesophageal reflux disease  and status post ablation of atrial fibrillation on April 07, 2020 admitted with pericarditis/CP.  Echocardiogram this admission showed ejection fraction 50 to 55%, moderate left ventricular hypertrophy and no pericardial effusion.  Assessment & Plan    1 pericarditis-occurring in the setting of recent atrial fibrillation ablation.  Symptoms improving this morning.  Echocardiogram did not show pericardial effusion.  Will discharge with 2 to 3 days of Tylenol 3.  We will arrange follow-up with electrophysiology early next week.  2 elevated troponin-likely secondary to recent   ablation.  No plans for further ischemia evaluation.  3 paroxysmal atrial fibrillation-status post ablation.  Continue apixaban.  >30 min PA and physician time D2  For questions or updates, please contact [facility] Please consult [email or web address] for contact info under        Signed, Keldon Lassen, MD  04/09/2020, 7:45 AM    

## 2020-04-09 NOTE — Discharge Instructions (Signed)
***  PLEASE REMEMBER TO BRING ALL OF YOUR MEDICATIONS TO EACH OF YOUR FOLLOW-UP OFFICE VISITS.  

## 2020-04-09 NOTE — Progress Notes (Signed)
Progress Note  Patient Name: Macarthur Lorusso Date of Encounter: 04/09/2020  [facility] Cardiologist: Dr [name]  Subjective   Continues with CP but improved; mild dyspnea  Inpatient Medications    Scheduled Meds: . apixaban  5 mg Oral BID  . atorvastatin  10 mg Oral Daily  . pantoprazole  40 mg Oral Daily   Continuous Infusions:  PRN Meds: acetaminophen, acetaminophen-codeine, famotidine, ondansetron (ZOFRAN) IV   Vital Signs    Vitals:   04/08/20 1809 04/08/20 1947 04/08/20 2355 04/09/20 0413  BP: (!) 156/97 126/79 122/80 136/84  Pulse: 78 84 72 73  Resp: 19 18 18 19   Temp:  98.4 F (36.9 C) 98.4 F (36.9 C) 97.7 F (36.5 C)  TempSrc:  Oral Oral Oral  SpO2: 98% 96% 97% 96%  Weight:      Height:        Intake/Output Summary (Last 24 hours) at 04/09/2020 0745 Last data filed at 04/09/2020 0646 Gross per 24 hour  Intake --  Output 300 ml  Net -300 ml   Last 3 Weights 04/08/2020 04/07/2020 04/06/2020  Weight (lbs) 199 lb 192 lb 190 lb  Weight (kg) 90.266 kg 87.091 kg 86.183 kg      Telemetry    Sinus- Personally Reviewed  Physical Exam   GEN: No acute distress.   Neck: No JVD Cardiac: RRR, no murmurs, rubs, or gallops.  Respiratory: Clear to auscultation bilaterally. GI: Soft, nontender, non-distended  MS: No edema Neuro:  Nonfocal  Psych: Normal affect   Labs    High Sensitivity Troponin:   Recent Labs  Lab 04/08/20 1326 04/08/20 1551  TROPONINIHS 1,596* 1,382*      Chemistry Recent Labs  Lab 04/08/20 1326  NA 139  K 3.7  CL 102  CO2 27  GLUCOSE 106*  BUN 13  CREATININE 0.76  CALCIUM 8.9  GFRNONAA >60  GFRAA >60  ANIONGAP 10     Hematology Recent Labs  Lab 04/08/20 1326 04/09/20 0234  WBC 9.2 7.4  RBC 4.37 3.85*  HGB 13.3 11.8*  HCT 39.6 34.8*  MCV 90.6 90.4  MCH 30.4 30.6  MCHC 33.6 33.9  RDW 12.0 12.1  PLT 168 143*    Radiology    DG Chest 2 View  Result Date: 04/08/2020 CLINICAL DATA:  Chest pain.  EXAM: CHEST - 2 VIEW COMPARISON:  December 10, 2006. FINDINGS: The heart size and mediastinal contours are within normal limits. No pneumothorax or pleural effusion is noted. Stable calcified granuloma is noted in right lung base. No acute pulmonary disease is noted. The visualized skeletal structures are unremarkable. IMPRESSION: No active cardiopulmonary disease. Electronically Signed   By: December 12, 2006 M.D.   On: 04/08/2020 13:59   ECHOCARDIOGRAM LIMITED  Result Date: 04/08/2020    ECHOCARDIOGRAM REPORT   Patient Name:   KEADEN GUNNOE TEN EYCK Date of Exam: 04/08/2020 Medical Rec #:  06/08/2020        Height:       71.0 in Accession #:    [id number]       Weight:       199.0 lb Date of Birth:  03-19-1956        BSA:          2.104 m Patient Age:    64 years         BP:           159/95 mmHg Patient Gender: M  HR:           79 bpm. Exam Location:  Inpatient Procedure: Limited Echo and Color Doppler Indications:    Chest pain  History:        Patient has prior history of Echocardiogram examinations, most                 recent 04/06/2020. Arrythmias:Atrial Fibrillation;                 Signs/Symptoms:Shortness of Breath. S/p ablation. GERD.  Sonographer:    Ross Ludwig RDCS (AE) Referring Phys: [phone] [facility] B ROBERTS  Sonographer Comments: Suboptimal subcostal window. IMPRESSIONS  1. Limited study to R/O pericardial effusion; no effusion noted.  2. Left ventricular ejection fraction, by estimation, is 50 to 55%. The left ventricle has low normal function. The left ventricle has no regional wall motion abnormalities. There is moderate left ventricular hypertrophy. Left ventricular diastolic function could not be evaluated.  3. Right ventricular systolic function is normal. The right ventricular size is normal.  4. The mitral valve is normal in structure. Trivial mitral valve regurgitation. No evidence of mitral stenosis.  5. The aortic valve is tricuspid. Aortic valve regurgitation is not visualized. Mild  aortic valve sclerosis is present, with no evidence of aortic valve stenosis.  6. The inferior vena cava is normal in size with greater than 50% respiratory variability, suggesting right atrial pressure of 3 mmHg. FINDINGS  Left Ventricle: Left ventricular ejection fraction, by estimation, is 50 to 55%. The left ventricle has low normal function. The left ventricle has no regional wall motion abnormalities. The left ventricular internal cavity size was normal in size. There is moderate left ventricular hypertrophy. Left ventricular diastolic function could not be evaluated. Right Ventricle: The right ventricular size is normal. Right ventricular systolic function is normal. Left Atrium: Left atrial size was normal in size. Right Atrium: Right atrial size was normal in size. Pericardium: There is no evidence of pericardial effusion. Mitral Valve: The mitral valve is normal in structure. Trivial mitral valve regurgitation. No evidence of mitral valve stenosis. Tricuspid Valve: The tricuspid valve is normal in structure. Tricuspid valve regurgitation is trivial. No evidence of tricuspid stenosis. Aortic Valve: The aortic valve is tricuspid. Aortic valve regurgitation is not visualized. Mild aortic valve sclerosis is present, with no evidence of aortic valve stenosis. Pulmonic Valve: The pulmonic valve was not well visualized. Pulmonic valve regurgitation is not visualized. No evidence of pulmonic stenosis. Aorta: The aortic root is normal in size and structure. Venous: The inferior vena cava is normal in size with greater than 50% respiratory variability, suggesting right atrial pressure of 3 mmHg.  Additional Comments: Limited study to R/O pericardial effusion; no effusion noted.  IVC IVC diam: 2.00 cm Olga Millers MD Electronically signed by Olga Millers MD Signature Date/Time: 04/08/2020/5:18:39 PM    Final     Patient Profile     64 year old male with past medical history of gastroesophageal reflux disease  and status post ablation of atrial fibrillation on April 07, 2020 admitted with pericarditis/CP.  Echocardiogram this admission showed ejection fraction 50 to 55%, moderate left ventricular hypertrophy and no pericardial effusion.  Assessment & Plan    1 pericarditis-occurring in the setting of recent atrial fibrillation ablation.  Symptoms improving this morning.  Echocardiogram did not show pericardial effusion.  Will discharge with 2 to 3 days of Tylenol 3.  We will arrange follow-up with electrophysiology early next week.  2 elevated troponin-likely secondary to recent  ablation.  No plans for further ischemia evaluation.  3 paroxysmal atrial fibrillation-status post ablation.  Continue apixaban.  >30 min PA and physician time D2  For questions or updates, please contact [facility] Please consult [email or web address] for contact info under        Signed, Olga Millers, MD  04/09/2020, 7:45 AM

## 2020-04-10 ENCOUNTER — Encounter (HOSPITAL_COMMUNITY): Payer: Self-pay | Admitting: Cardiology

## 2020-04-10 ENCOUNTER — Telehealth: Payer: Self-pay | Admitting: Cardiology

## 2020-04-10 NOTE — Anesthesia Postprocedure Evaluation (Signed)
Anesthesia Post Note  Patient: Dylan Olsen  Procedure(s) Performed: ATRIAL FIBRILLATION ABLATION (N/A )     Patient location during evaluation: Cath Lab Anesthesia Type: General Level of consciousness: awake and alert Pain management: pain level controlled Vital Signs Assessment: post-procedure vital signs reviewed and stable Respiratory status: spontaneous breathing, nonlabored ventilation, respiratory function stable and patient connected to nasal cannula oxygen Cardiovascular status: blood pressure returned to baseline and stable Postop Assessment: no apparent nausea or vomiting Anesthetic complications: no   No complications documented.  Last Vitals:  Vitals:   04/07/20 1310 04/07/20 1340  BP: (!) 139/93 (!) 143/89  Pulse: 74 76  Resp: 14 14  Temp:    SpO2: 98% 96%    Last Pain:  Vitals:   04/10/20 1054  TempSrc:   PainSc: 0-No pain                 Jamas Jaquay COKER

## 2020-04-10 NOTE — Telephone Encounter (Signed)
Called to speak with the patient regarding his ER visit over the weekend. His symptoms are improving. Advised him to take Ibuprofen 600mg  by mouth twice daily with food for the next 3 days. He expressed understanding of the plan.  T. Sheria Lang, MD, [facility] Cardiac Electrophysiology

## 2020-04-11 NOTE — Interval H&P Note (Signed)
History and Physical Interval Note:  04/11/2020 8:43 PM  Note Rentry for September evaluation.  Dylan Olsen  has presented today for surgery, with the diagnosis of AFIB.  The various methods of treatment have been discussed with the patient and family. After consideration of risks, benefits and other options for treatment, the patient has consented to  Procedure(s): TRANSESOPHAGEAL ECHOCARDIOGRAM (TEE) (N/A) BUBBLE STUDY as a surgical intervention.  The patient's history has been reviewed, patient examined, no change in status, stable for surgery.  I have reviewed the patient's chart and labs.  Questions were answered to the patient's satisfaction.     Dylan Olsen A Karinda Cabriales

## 2020-04-11 NOTE — Interval H&P Note (Signed)
History and Physical Interval Note:  04/11/2020 8:43 PM  Dylan Olsen [name]  has presented today for surgery, with the diagnosis of AFIB.  The various methods of treatment have been discussed with the patient and family. After consideration of risks, benefits and other options for treatment, the patient has consented to  Procedure(s): TRANSESOPHAGEAL ECHOCARDIOGRAM (TEE) (N/A) BUBBLE STUDY as a surgical intervention.  The patient's history has been reviewed, patient examined, no change in status, stable for surgery.  I have reviewed the patient's chart and labs.  Questions were answered to the patient's satisfaction.     Amilya Haver A Joelyn Lover

## 2020-04-19 ENCOUNTER — Other Ambulatory Visit: Payer: Self-pay

## 2020-04-19 ENCOUNTER — Ambulatory Visit: Payer: 59 | Admitting: Cardiology

## 2020-04-19 VITALS — BP 146/80 | HR 85 | Ht 71.0 in | Wt 198.0 lb

## 2020-04-19 DIAGNOSIS — E782 Mixed hyperlipidemia: Secondary | ICD-10-CM

## 2020-04-19 DIAGNOSIS — I4819 Other persistent atrial fibrillation: Secondary | ICD-10-CM

## 2020-04-19 DIAGNOSIS — I300 Acute nonspecific idiopathic pericarditis: Secondary | ICD-10-CM

## 2020-04-19 NOTE — Patient Instructions (Addendum)
Medication Instructions:  Your physician recommends that you continue on your current medications as directed. Please refer to the Current Medication list given to you today. *If you need a refill on your cardiac medications before your next appointment, please call your pharmacy*  Lab Work: None ordered. If you have labs (blood work) drawn today and your tests are completely normal, you will receive your results only by: Marland Kitchen MyChart Message (if you have MyChart) OR . A paper copy in the mail If you have any lab test that is abnormal or we need to change your treatment, we will call you to review the results.  Testing/Procedures: None ordered.  Follow-Up: At [facility], you and your health needs are our priority.  As part of our continuing mission to provide you with exceptional heart care, we have created designated Provider Care Teams.  These Care Teams include your primary Cardiologist (physician) and Advanced Practice Providers (APPs -  Physician Assistants and Nurse Practitioners) who all work together to provide you with the care you need, when you need it.  We recommend signing up for the patient portal called "MyChart".  Sign up information is provided on this After Visit Summary.  MyChart is used to connect with patients for Virtual Visits (Telemedicine).  Patients are able to view lab/test results, encounter notes, upcoming appointments, etc.  Non-urgent messages can be sent to your provider as well.   To learn more about what you can do with MyChart, go to [email or web address].    As previously scheduled.

## 2020-04-19 NOTE — Progress Notes (Signed)
Electrophysiology Office Follow up Visit Note:    Date:  04/19/2020   ID:  Dylan Olsen, Dylan Olsen, MRN [id number]  PCP:  Lynnda Child, MD  [facility] Cardiologist:  No primary care provider on file.  [facility] Electrophysiologist:  [name], MD    Interval History:    Dylan Olsen is a 64 y.o. male who presents for a follow up visit. They were last seen in clinic 03/06/2020. He underwent AF ablation on 04/07/2020. The ablation was uncomplicated although he did take a prolonged amount of time to recover from anaesthesia. He was discharged and developed pericarditis. Evaluation of the pericarditis included an echocardiogram which showed no pericardial effusion, normal LV/RV function and a collapsible IVC.   Today, he tells me he has maintained normal rhythm and has returned to work. He does think he is feeling more exhausted than usual but he is unsure why. He thinks it could be due to just a prolonged recovery time from surgery.   He is with his wife today.     Past Medical History:  Diagnosis Date  . Complication of anesthesia    difficult to wake, stopped breathing, bagged  . Pericarditis    a. 04/2020 following Afib ablation.  . Persistent atrial fibrillation (HCC)    a. 04/2020 s/p PVI.    Past Surgical History:  Procedure Laterality Date  . ATRIAL FIBRILLATION ABLATION N/A 04/07/2020   Procedure: ATRIAL FIBRILLATION ABLATION;  Surgeon: [name], MD;  [facility];  Service: Cardiovascular;  Laterality: N/A;  . BACK SURGERY    . BUBBLE STUDY  04/06/2020   Procedure: BUBBLE STUDY;  Surgeon: [name], MD;  Location: [facility] ENDOSCOPY;  Service: Cardiovascular;;  . FOOT SURGERY    . KNEE SURGERY Left   . TEE WITHOUT CARDIOVERSION N/A 04/06/2020   Procedure: TRANSESOPHAGEAL ECHOCARDIOGRAM (TEE);  Surgeon: [name], MD;  Location: [facility] ENDOSCOPY;  Service: Cardiovascular;  Laterality: N/A;     Current Medications: Current Meds  Medication Sig  . acetaminophen (TYLENOL) 500 MG tablet Take 1,000 mg by mouth every 6 (six) hours as needed (for pain.).  Marland Kitchen acetaminophen-codeine (TYLENOL #3) 300-30 MG tablet Take 2 tablets by mouth every 8 (eight) hours as needed for moderate pain.  Marland Kitchen apixaban (ELIQUIS) 5 MG TABS tablet Take 1 tablet (5 mg total) by mouth 2 (two) times daily.  Marland Kitchen atorvastatin (LIPITOR) 10 MG tablet Take 1 tablet (10 mg total) by mouth daily.  . cetirizine (ZYRTEC) 10 MG tablet Take 10 mg by mouth daily.  . diphenhydrAMINE-Zinc Acetate (BENADRYL EX) Apply 1 application topically 2 (two) times daily as needed (rash/itching).  . Meclizine HCl (BONINE PO) Take 1 tablet by mouth 2 (two) times daily as needed (vertigo/dizziness.). Take as needed for vertigo   . pantoprazole (PROTONIX) 40 MG tablet Take 1 tablet (40 mg total) by mouth daily.     Allergies:   Other   Social History   Socioeconomic History  . Marital status: Married    Spouse name: Dylan Olsen  . Number of children: 2  . Years of education: high school  . Highest education level: Not on file  Occupational History  . Occupation: Curator  Tobacco Use  . Smoking status: Current Every Day Smoker    Types: Cigars  . Smokeless tobacco: Never Used  . Tobacco comment: a few times a year  Vaping Use  . Vaping Use: Never used  Substance and  Sexual Activity  . Alcohol use: Yes    Comment: less than monthly  . Drug use: Yes    Types: Marijuana    Comment: a few times a week  . Sexual activity: Yes    Birth control/protection: Post-menopausal  Other Topics Concern  . Not on file  Social History Narrative   01/13/20   From: [place], [place] originally - moved to [facility] [place]   Living: with wife Dylan Olsen (1996), and step daughter (special needs)   Work: English as a second language teacher for [employer]      Family: 2 children - Dylan Olsen and Dylan Olsen - in [place], good relationship and 3 grandchildren and 2 step children      Enjoys: drag  racing, football      Exercise: not currently - walking and mobile job   Diet: not great, sometimes a little better      Safety   Seat belts: Yes    Guns: Yes  and secure   Safe in relationships: Yes    Social Determinants of Health   Financial Resource Strain:   . Difficulty of Paying Living Expenses: Not on file  Food Insecurity:   . Worried About Programme researcher, broadcasting/film/video in the Last Year: Not on file  . Ran Out of Food in the Last Year: Not on file  Transportation Needs:   . Lack of Transportation (Medical): Not on file  . Lack of Transportation (Non-Medical): Not on file  Physical Activity:   . Days of Exercise per Week: Not on file  . Minutes of Exercise per Session: Not on file  Stress:   . Feeling of Stress : Not on file  Social Connections:   . Frequency of Communication with Friends and Family: Not on file  . Frequency of Social Gatherings with Friends and Family: Not on file  . Attends Religious Services: Not on file  . Active Member of Clubs or Organizations: Not on file  . Attends Banker Meetings: Not on file  . Marital Status: Not on file     Family History: The patient's family history includes Cancer in his cousin; Heart attack (age of onset: 67) in his father; Heart disease in his father.  ROS:   Please see the history of present illness.    All other systems reviewed and are negative.  EKGs/Labs/Other Studies Reviewed:    The following studies were reviewed today: Echocardiogram from 10/2.  04/08/2020 echo personally reviewed LV function normal No pericardial effusion  EKG:  The ekg ordered today demonstrates sinus rhythm.  Recent Labs: 01/13/2020: ALT 33; Pro B Natriuretic peptide (BNP) 66.0 04/08/2020: BUN 13; Creatinine, Ser 0.76; Potassium 3.7; Sodium 139 04/09/2020: Hemoglobin 11.8; Platelets 143  Recent Lipid Panel    Component Value Date/Time   CHOL 215 (H) 01/13/2020 1632   TRIG (H) 01/13/2020 1632    430.0 Triglyceride is over  400; calculations on Lipids are invalid.   HDL 35.30 (L) 01/13/2020 1632   CHOLHDL 6 01/13/2020 1632   LDLDIRECT 105.0 01/13/2020 1632    Physical Exam:    VS:  BP (!) 146/80   Pulse 85   Ht 5\' 11"  (1.803 m)   Wt 198 lb (89.8 kg)   SpO2 98%   BMI 27.62 kg/m     Wt Readings from Last 3 Encounters:  04/19/20 198 lb (89.8 kg)  04/08/20 199 lb (90.3 kg)  04/07/20 192 lb (87.1 kg)     GEN: Well nourished, well developed in no acute  distress HEENT: Normal NECK: No JVD; No carotid bruits LYMPHATICS: No lymphadenopathy CARDIAC: RRR, no murmurs, rubs, gallops. Groin access sites without erythema/pain. Small firm area immediate beneath the skin on the left femoral area consistent with small hematoma vs scar tissue.  RESPIRATORY:  Clear to auscultation without rales, wheezing or rhonchi  ABDOMEN: Soft, non-tender, non-distended MUSCULOSKELETAL:  No edema; No deformity  SKIN: Warm and dry NEUROLOGIC:  Alert and oriented x 3 PSYCHIATRIC:  Normal affect   ASSESSMENT:    1. Persistent atrial fibrillation (HCC)   2. Acute idiopathic pericarditis   3. Mixed hyperlipidemia    PLAN:    In order of problems listed above:  1. Persistent atrial fibrillation S/p ablation 04/07/2020. He looks good post operatively and is maintaining sinus rhythm. He required a significant amount of time to fully wake up post op which happened to him after a previous operation. Now he is back to work but does report some fatigue. I suspect this will improve slowly ove rthe coming days-week as he gets back to his normal routine given his pericarditis limited his initial recovery.  Recommend he continue his current medical regimen of eliquis.  2. Pericarditis Now resolved. Related to PVI.  3. HLD He is on atorva 10mg  daily. Will need to recheck lipids to assess response to low dose atorvastatin.   Medication Adjustments/Labs and Tests Ordered: Current medicines are reviewed at length with the patient  today.  Concerns regarding medicines are outlined above.  Orders Placed This Encounter  Procedures  . EKG 12-Lead   No orders of the defined types were placed in this encounter.    Signed, , MD, [facility]  04/19/2020 7:43 PM    Electrophysiology [facility]

## 2020-04-30 ENCOUNTER — Other Ambulatory Visit: Payer: Self-pay | Admitting: Physician Assistant

## 2020-05-01 NOTE — Telephone Encounter (Signed)
Pt's pharmacy is requesting a refill on pantoprazole. Would Dr. Lambert like to refill this medication? Please address 

## 2020-05-05 ENCOUNTER — Ambulatory Visit (HOSPITAL_COMMUNITY)
Admission: RE | Admit: 2020-05-05 | Discharge: 2020-05-05 | Disposition: A | Discharge status: Home / Self Care | Payer: 59 | Source: Ambulatory Visit | Attending: Physician Assistant | Admitting: Physician Assistant

## 2020-05-05 ENCOUNTER — Other Ambulatory Visit: Payer: Self-pay

## 2020-05-05 VITALS — BP 170/102 | HR 69 | Ht 71.0 in | Wt 202.6 lb

## 2020-05-05 DIAGNOSIS — R030 Elevated blood-pressure reading, without diagnosis of hypertension: Secondary | ICD-10-CM | POA: Insufficient documentation

## 2020-05-05 DIAGNOSIS — I5020 Unspecified systolic (congestive) heart failure: Secondary | ICD-10-CM | POA: Diagnosis not present

## 2020-05-05 DIAGNOSIS — Z8249 Family history of ischemic heart disease and other diseases of the circulatory system: Secondary | ICD-10-CM | POA: Diagnosis not present

## 2020-05-05 DIAGNOSIS — I4819 Other persistent atrial fibrillation: Secondary | ICD-10-CM | POA: Diagnosis present

## 2020-05-05 DIAGNOSIS — F17290 Nicotine dependence, other tobacco product, uncomplicated: Secondary | ICD-10-CM | POA: Diagnosis not present

## 2020-05-05 NOTE — Progress Notes (Signed)
Primary Care Physician: Lynnda Child, MD Primary Electrophysiologist: Dr [name] Referring Physician: Dr Particia Jasper [name] is a 64 y.o. male with a history of systolic dysfunction and persistent atrial fibrillation who presents for follow up in the [facility].  The patient was initially diagnosed with atrial fibrillation 01/2020 at a PCP office visit after presenting with symptoms of dyspnea with exertion. He had an echo which showed EF 40-45%. Seen by Dr [name] on 03/06/20, started on Eliquis for a CHADS2VASC score of 1. He underwent afib ablation on 04/07/20.  On follow up today, patient was seen at the ED 04/08/20 with chest pain with inspiration. Repeat echo showed no effusion, suspected post ablation pericarditis. Since then, he denies any CP, swallowing, or groin issues. He does have very brief palpitations lasting only a few seconds. He denies any bleeding issues on anticoagulation.    Today, he denies symptoms of chest pain, shortness of breath, orthopnea, PND, lower extremity edema, dizziness, presyncope, syncope, snoring, daytime somnolence, bleeding, or neurologic sequela. The patient is tolerating medications without difficulties and is otherwise without complaint today.    Atrial Fibrillation Risk Factors:  he does not have symptoms or diagnosis of sleep apnea. he does not have a history of rheumatic fever.   he has a BMI of Body mass index is 28.26 kg/m.Marland Kitchen Filed Weights   05/05/20 1005  Weight: 91.9 kg    Family History  Problem Relation Age of Onset   Heart disease Father    Heart attack Father 22   Cancer Cousin        unknown primary     Atrial Fibrillation Management history:  Previous antiarrhythmic drugs: none Previous cardioversions: none Previous ablations: 04/07/20 CHADS2VASC score: 1 Anticoagulation history: Eliquis   Past Medical History:  Diagnosis Date   Complication of anesthesia    difficult to  wake, stopped breathing, bagged   Pericarditis    a. 04/2020 following Afib ablation.   Persistent atrial fibrillation (HCC)    a. 04/2020 s/p PVI.   Past Surgical History:  Procedure Laterality Date   ATRIAL FIBRILLATION ABLATION N/A 04/07/2020   Procedure: ATRIAL FIBRILLATION ABLATION;  Surgeon: [name], MD;  [facility];  Service: Cardiovascular;  Laterality: N/A;   BACK SURGERY     BUBBLE STUDY  04/06/2020   Procedure: BUBBLE STUDY;  Surgeon: [name], MD;  Location: [facility] ENDOSCOPY;  Service: Cardiovascular;;   FOOT SURGERY     KNEE SURGERY Left    TEE WITHOUT CARDIOVERSION N/A 04/06/2020   Procedure: TRANSESOPHAGEAL ECHOCARDIOGRAM (TEE);  Surgeon: [name], MD;  Location: [facility] ENDOSCOPY;  Service: Cardiovascular;  Laterality: N/A;    Current Outpatient Medications  Medication Sig Dispense Refill   acetaminophen (TYLENOL) 500 MG tablet Take 1,000 mg by mouth every 6 (six) hours as needed (for pain.).     apixaban (ELIQUIS) 5 MG TABS tablet Take 1 tablet (5 mg total) by mouth 2 (two) times daily. 60 tablet 5   atorvastatin (LIPITOR) 10 MG tablet Take 1 tablet (10 mg total) by mouth daily. 90 tablet 3   cetirizine (ZYRTEC) 10 MG tablet Take 10 mg by mouth daily.     Meclizine HCl (BONINE PO) Take 1 tablet by mouth 2 (two) times daily as needed (vertigo/dizziness.). Take as needed for vertigo      pantoprazole (PROTONIX) 40 MG tablet Take 1 tablet (40 mg total) by mouth daily. 45 tablet  0   No current facility-administered medications for this encounter.    Allergies  Allergen Reactions   Other Other (See Comments)    Pt reports he "coded after having anesthesia with a back surgery"  Can not recall what medicine    Social History   Socioeconomic History   Marital status: Married    Spouse name: [name]   Number of children: 2   Years of education: high school   Highest education level: Not on file    Occupational History   Occupation: Curator  Tobacco Use   Smoking status: Current Every Day Smoker    Types: Cigars   Smokeless tobacco: Never Used   Tobacco comment: a few times a year  Vaping Use   Vaping Use: Never used  Substance and Sexual Activity   Alcohol use: Yes    Comment: less than monthly   Drug use: Yes    Types: Marijuana    Comment: a few times a week   Sexual activity: Yes    Birth control/protection: Post-menopausal  Other Topics Concern   Not on file  Social History Narrative   01/13/20   From: [place], [place] originally - moved to [place] 1996   Living: with wife [name] (1996), and step daughter (special needs)   Work: English as a second language teacher for [employer]      Family: 2 children - [name] and Clemons - in [place], good relationship and 3 grandchildren and 2 step children      Enjoys: drag racing, football      Exercise: not currently - walking and mobile job   Diet: not great, sometimes a little better      Safety   Seat belts: Yes    Guns: Yes  and secure   Safe in relationships: Yes    Social Determinants of Corporate investment banker Strain:    Difficulty of Paying Living Expenses: Not on file  Food Insecurity:    Worried About Programme researcher, broadcasting/film/video in the Last Year: Not on file   [employer] of Food in the Last Year: Not on file  Transportation Needs:    Lack of Transportation (Medical): Not on file   Lack of Transportation (Non-Medical): Not on file  Physical Activity:    Days of Exercise per Week: Not on file   Minutes of Exercise per Session: Not on file  Stress:    Feeling of Stress : Not on file  Social Connections:    Frequency of Communication with Friends and Family: Not on file   Frequency of Social Gatherings with Friends and Family: Not on file   Attends Religious Services: Not on file   Active Member of Clubs or Organizations: Not on file   Attends Banker Meetings: Not on file   Marital Status: Not on file   Intimate Partner Violence:    Fear of Current or Ex-Partner: Not on file   Emotionally Abused: Not on file   Physically Abused: Not on file   Sexually Abused: Not on file     ROS- All systems are reviewed and negative except as per the HPI above.  Physical Exam: Vitals:   05/05/20 1005  BP: (!) 170/102  Pulse: 69  Weight: 91.9 kg  Height: 5\' 11"  (1.803 m)    GEN- The patient is well appearing, alert and oriented x 3 today.   Head- normocephalic, atraumatic Eyes-  Sclera clear, conjunctiva pink Ears- hearing intact Oropharynx- clear Neck- supple  Lungs- Clear  to ausculation bilaterally, normal work of breathing Heart- Regular rate and rhythm, no murmurs, rubs or gallops  GI- soft, NT, ND, + BS Extremities- no clubbing, cyanosis, or edema MS- no significant deformity or atrophy Skin- no rash or lesion Psych- euthymic mood, full affect Neuro- strength and sensation are intact  Wt Readings from Last 3 Encounters:  05/05/20 91.9 kg  04/19/20 89.8 kg  04/08/20 90.3 kg    EKG today demonstrates SR HR 69, inc RBBB, PR 176, QRS 102, QTc 415  Echo 04/08/20 demonstrated  1. Limited study to R/O pericardial effusion; no effusion noted.  2. Left ventricular ejection fraction, by estimation, is 50 to 55%. The  left ventricle has low normal function. The left ventricle has no regional  wall motion abnormalities. There is moderate left ventricular hypertrophy.  Left ventricular diastolic  function could not be evaluated.  3. Right ventricular systolic function is normal. The right ventricular  size is normal.  4. The mitral valve is normal in structure. Trivial mitral valve  regurgitation. No evidence of mitral stenosis.  5. The aortic valve is tricuspid. Aortic valve regurgitation is not  visualized. Mild aortic valve sclerosis is present, with no evidence of  aortic valve stenosis.  6. The inferior vena cava is normal in size with greater than 50%  respiratory  variability, suggesting right atrial pressure of 3 mmHg.   Epic records are reviewed at length today  CHA2DS2-VASc Score = 1  The patient's score is based upon: CHF History: 1 HTN History: 0 Diabetes History: 0 Stroke History: 0 Vascular Disease History: 0 Age Score: 0 Gender Score: 0      ASSESSMENT AND PLAN: 1. Persistent Atrial Fibrillation (ICD10:  I48.19) The patient's CHA2DS2-VASc score is 1, indicating a 0.6% annual risk of stroke.   S/p afib ablation 04/07/20 with Dr [name] Patient appears to be maintaining SR. Reassured that some afib is not uncommon for the first 3 months post ablation.  Continue Eliquis 5 mg BID for at least 3 months post ablation.   2. Systolic dysfunction EF improved to 50-55%  3. Elevated BP BP elevated today. On review of last office visits, has been controlled in the past. Will not make changes today. He has a BP machine at home. Encouraged him to keep a BP log for review.    Follow up with Dr [name] as scheduled.    Jorja Loa PA-C Afib Clinic [facility] [address] [place], [place] [address] [phone] 05/05/2020 10:25 AM

## 2020-07-11 ENCOUNTER — Encounter: Payer: Self-pay | Admitting: Cardiology

## 2020-07-11 ENCOUNTER — Ambulatory Visit: Payer: 59 | Admitting: Cardiology

## 2020-07-11 ENCOUNTER — Other Ambulatory Visit: Payer: Self-pay

## 2020-07-11 VITALS — BP 142/92 | HR 75 | Ht 71.0 in | Wt 197.0 lb

## 2020-07-11 DIAGNOSIS — I4819 Other persistent atrial fibrillation: Secondary | ICD-10-CM | POA: Diagnosis not present

## 2020-07-11 NOTE — Progress Notes (Signed)
Electrophysiology Office Follow up Visit Note:    Date:  07/11/2020   ID:  Dylan Olsen, Dylan Olsen 02-15-1956, MRN [id number]  PCP:  Lynnda Child, MD  [facility] Cardiologist:  No primary care provider on file.  [facility] Electrophysiologist:  [name], MD    Interval History:    Dylan Olsen is a 65 y.o. male who presents for a follow up visit.  He underwent a successful atrial fibrillation ablation April 07, 2020.  Since that time he has maintained normal rhythm without known recurrence of atrial fibrillation.  From a cardiac perspective he has been doing well since his ablation.  Unfortunately he has had 3 close friends/family passed away within the last month or 2 which has been weighing heavily on him.  We discussed long-term anticoagulation during today's visit given his desire to stop the anticoagulant if possible.     Past Medical History:  Diagnosis Date  . Complication of anesthesia    difficult to wake, stopped breathing, bagged  . Pericarditis    a. 04/2020 following Afib ablation.  . Persistent atrial fibrillation (HCC)    a. 04/2020 s/p PVI.    Past Surgical History:  Procedure Laterality Date  . ATRIAL FIBRILLATION ABLATION N/A 04/07/2020   Procedure: ATRIAL FIBRILLATION ABLATION;  Surgeon: [name], MD;  Location: [facility] INVASIVE CV LAB;  Service: Cardiovascular;  Laterality: N/A;  . BACK SURGERY    . BUBBLE STUDY  04/06/2020   Procedure: BUBBLE STUDY;  Surgeon: [name], MD;  Location: [facility] ENDOSCOPY;  Service: Cardiovascular;;  . FOOT SURGERY    . KNEE SURGERY Left   . TEE WITHOUT CARDIOVERSION N/A 04/06/2020   Procedure: TRANSESOPHAGEAL ECHOCARDIOGRAM (TEE);  Surgeon: [name], MD;  Location: [facility] ENDOSCOPY;  Service: Cardiovascular;  Laterality: N/A;    Current Medications: Current Meds  Medication Sig  . acetaminophen (TYLENOL) 500 MG tablet Take 1,000 mg by mouth every 6 (six) hours as needed (for  pain.).  Marland Kitchen apixaban (ELIQUIS) 5 MG TABS tablet Take 1 tablet (5 mg total) by mouth 2 (two) times daily.  Marland Kitchen atorvastatin (LIPITOR) 10 MG tablet Take 1 tablet (10 mg total) by mouth daily.  . cetirizine (ZYRTEC) 10 MG tablet Take 10 mg by mouth daily.  . Meclizine HCl (BONINE PO) Take 1 tablet by mouth 2 (two) times daily as needed (vertigo/dizziness.). Take as needed for vertigo  . pantoprazole (PROTONIX) 40 MG tablet Take 40 mg by mouth daily.  . [DISCONTINUED] pantoprazole (PROTONIX) 40 MG tablet Take 1 tablet (40 mg total) by mouth daily.     Allergies:   Other   Social History   Socioeconomic History  . Marital status: Married    Spouse name: [name]  . Number of children: 2  . Years of education: high school  . Highest education level: Not on file  Occupational History  . Occupation: Curator  Tobacco Use  . Smoking status: Current Every Day Smoker    Types: Cigars  . Smokeless tobacco: Never Used  . Tobacco comment: a few times a year  Vaping Use  . Vaping Use: Never used  Substance and Sexual Activity  . Alcohol use: Yes    Comment: less than monthly  . Drug use: Yes    Types: Marijuana    Comment: a few times a week  . Sexual activity: Yes    Birth control/protection: Post-menopausal  Other Topics Concern  . Not on file  Social  History Narrative   01/13/20   From: [place], [place] originally - moved to [place]: with wife [name] (1996), and step daughter (special needs)   Work: Psychiatrist for [employer]      Family: 2 children - Luellen Pucker and Crucible - in [place], good relationship and 3 grandchildren and 2 step children      Enjoys: drag racing, football      Exercise: not currently - walking and mobile job   Diet: not great, sometimes a little better      Safety   Seat belts: Yes    Guns: Yes  and secure   Safe in relationships: Yes    Social Determinants of Radio broadcast assistant Strain: Not on file  Food Insecurity: Not on file   Transportation Needs: Not on file  Physical Activity: Not on file  Stress: Not on file  Social Connections: Not on file     Family History: The patient's family history includes Cancer in his cousin; Heart attack (age of onset: 47) in his father; Heart disease in his father.  ROS:   Please see the history of present illness.    All other systems reviewed and are negative.  EKGs/Labs/Other Studies Reviewed:    The following studies were reviewed today:     EKG:  The ekg ordered today demonstrates normal sinus rhythm.  Recent Labs: 01/13/2020: ALT 33; Pro B Natriuretic peptide (BNP) 66.0 04/08/2020: BUN 13; Creatinine, Ser 0.76; Potassium 3.7; Sodium 139 04/09/2020: Hemoglobin 11.8; Platelets 143  Recent Lipid Panel    Component Value Date/Time   CHOL 215 (H) 01/13/2020 1632   TRIG (H) 01/13/2020 1632    430.0 Triglyceride is over 400; calculations on Lipids are invalid.   HDL 35.30 (L) 01/13/2020 1632   CHOLHDL 6 01/13/2020 1632   LDLDIRECT 105.0 01/13/2020 1632    Physical Exam:    VS:  BP (!) 142/92   Pulse 75   Ht 5\' 11"  (1.803 m)   Wt 197 lb (89.4 kg)   SpO2 98%   BMI 27.48 kg/m     Wt Readings from Last 3 Encounters:  07/11/20 197 lb (89.4 kg)  05/05/20 202 lb 9.6 oz (91.9 kg)  04/19/20 198 lb (89.8 kg)     GEN: Well nourished, well developed in no acute distress HEENT: Normal NECK: No JVD; No carotid bruits LYMPHATICS: No lymphadenopathy CARDIAC: RRR, no murmurs, rubs, gallops RESPIRATORY:  Clear to auscultation without rales, wheezing or rhonchi  ABDOMEN: Soft, non-tender, non-distended MUSCULOSKELETAL:  No edema; No deformity  SKIN: Warm and dry NEUROLOGIC:  Alert and oriented x 3 PSYCHIATRIC:  Normal affect   ASSESSMENT:    1. Persistent atrial fibrillation (HCC)    PLAN:    In order of problems listed above:  1. Persistent atrial fibrillation Patient is doing well after his A. fib ablation on April 07, 2020.  He is maintaining sinus  rhythm.  He is maintained on Eliquis 5 mg twice daily for stroke prophylaxis.  Patient expressed an interest during today's appointment and stopping his anticoagulant in an effort to avoid the long-term side effects and bleeding risks.  I discussed the limited data to support a strategy involving stopping anticoagulation after successful atrial fibrillation ablation.  We also discussed the possibility of using a loop recorder to monitor for recurrent atrial fibrillation if he desires a strategy that avoid long-term anticoagulant use.  We discussed the loop recorder implant procedure and associated risks/recovery during  today's appointment.  He plans to talk to his wife about this procedure and will let us know if he wants to pursue this strategy.  In the meantime I have encouraged him to continue taking the Eliquis for stroke prophylaxis.   Medication Adjustments/Labs and Tests Ordered: Current medicines are reviewed at length with the patient today.  Concerns regarding medicines are outlined above.  Orders Placed This Encounter  Procedures  . EKG 12-Lead   No orders of the defined types were placed in this encounter.    Signed, Steffanie Dunn, MD, [facility]  07/11/2020 6:36 PM    Electrophysiology [facility]

## 2020-07-11 NOTE — Patient Instructions (Signed)
Medication Instructions:  Your physician recommends that you continue on your current medications as directed. Please refer to the Current Medication list given to you today.  *If you need a refill on your cardiac medications before your next appointment, please call your pharmacy*   Lab Work: None ordered.  If you have labs (blood work) drawn today and your tests are completely normal, you will receive your results only by: Marland Kitchen MyChart Message (if you have MyChart) OR . A paper copy in the mail If you have any lab test that is abnormal or we need to change your treatment, we will call you to review the results.   Testing/Procedures: Dr [name] would like for you to consider implantation of a Loop Recorder which monitors the heart for abnormal heart rhythm.  Please contact Juliette Mangle at [phone], Dr Lovena Neighbours nurse if you decide you would like to schedule.    Follow-Up: At [facility], you and your health needs are our priority.  As part of our continuing mission to provide you with exceptional heart care, we have created designated Provider Care Teams.  These Care Teams include your primary Cardiologist (physician) and Advanced Practice Providers (APPs -  Physician Assistants and Nurse Practitioners) who all work together to provide you with the care you need, when you need it.  We recommend signing up for the patient portal called "MyChart".  Sign up information is provided on this After Visit Summary.  MyChart is used to connect with patients for Virtual Visits (Telemedicine).  Patients are able to view lab/test results, encounter notes, upcoming appointments, etc.  Non-urgent messages can be sent to your provider as well.   To learn more about what you can do with MyChart, go to [email or web address].    Your next appointment:   6 month(s)  The format for your next appointment:   In Person  Provider:   Steffanie Dunn, MD

## 2020-08-01 ENCOUNTER — Telehealth: Payer: Self-pay

## 2020-08-01 NOTE — Telephone Encounter (Signed)
Per appt notes pt already has video visit with Dr Patsy Lager on 08/02/20 at 8:40.

## 2020-08-01 NOTE — Telephone Encounter (Signed)
[facility] Day - Client TELEPHONE ADVICE RECORD AccessNurse [name] Gender: Male DOB: 1955-09-12 Age: 65 Y 8 M 5 D Return Phone Number: [phone] (Primary), [phone] (Secondary) Address: [place] [address] Client [facility] - Client Client Site  [facility] - Day Physician Gweneth Dimitri- MD Contact Type Call Who Is Calling Patient / Member / Family / Caregiver Call Type Triage / Clinical Relationship To Patient Self Return Phone Number [phone] (Primary) Chief Complaint Dizziness Reason for Call Symptomatic / Request for Health Information Initial Comment Caller states he has dizziness and nausea. Translation No Nurse Assessment Nurse: Annye English, RN, Denise Date/Time (Eastern Time): 08/01/2020 8:36:06 AM Confirm and document reason for call. If symptomatic, describe symptoms. ---Caller states he has dizziness and nausea. Vomited yesterday. Does the patient have any new or worsening symptoms? ---Yes Will a triage be completed? ---Yes Related visit to physician within the last 2 weeks? ---No Does the PT have any chronic conditions? (i.e. diabetes, asthma, this includes High risk factors for pregnancy, etc.) ---Yes List chronic conditions. ---A-Fib w/ablation 2 mos ago and takes anticoag tx Is this a behavioral health or substance abuse call? ---No Guidelines Guideline Title Affirmed Question Affirmed Notes Nurse Date/Time (Eastern Time) Dizziness - Lightheadedness SEVERE dizziness (e.g., unable to stand, requires support to walk, feels like passing out now) [place], RN, Angelique Blonder 08/01/2020 8:37:04 AM Disp. Time Lamount Cohen Time) Disposition Final User 08/01/2020 8:40:42 AM Go to ED Now (or PCP triage) Yes [place], RN, Leighton Ruff Disagree/Comply Comply Caller Understands Yes PreDisposition Call Doctor PLEASE NOTE: All timestamps contained within this report are  represented as [place] Standard Time. CONFIDENTIALTY NOTICE: This fax transmission is intended only for the addressee. It contains information that is legally privileged, confidential or otherwise protected from use or disclosure. If you are not the intended recipient, you are strictly prohibited from reviewing, disclosing, copying using or disseminating any of this information or taking any action in reliance on or regarding this information. If you have received this fax in error, please notify us immediately by telephone so that we can arrange for its return to [place]. Phone: [phone], Toll-Free: [phone], Fax: [phone] Page: 2 of 2 Call Id: [id number] Care Advice Given Per Guideline ANOTHER ADULT SHOULD DRIVE: * It is better and safer if another adult drives instead of you. BRING MEDICINES: * Bring a list of your current medicines when you go to the Emergency Department (ER). CARE ADVICE given per Dizziness (Adult) guideline. GO TO ED NOW (OR PCP TRIAGE): Referrals GO TO FACILITY UNDECIDED

## 2020-08-02 ENCOUNTER — Telehealth (INDEPENDENT_AMBULATORY_CARE_PROVIDER_SITE_OTHER): Payer: 59 | Admitting: Family Medicine

## 2020-08-02 ENCOUNTER — Encounter: Payer: Self-pay | Admitting: Family Medicine

## 2020-08-02 VITALS — BP 151/99 | HR 70 | Ht 71.0 in

## 2020-08-02 DIAGNOSIS — R42 Dizziness and giddiness: Secondary | ICD-10-CM | POA: Diagnosis not present

## 2020-08-02 MED ORDER — DIAZEPAM 2 MG PO TABS: 1.0000 mg | ORAL_TABLET | Freq: Three times a day (TID) | ORAL | 0 refills | Status: DC | PRN

## 2020-08-02 MED ORDER — SCOPOLAMINE 1 MG/3DAYS TD PT72: 1.0000 | MEDICATED_PATCH | TRANSDERMAL | 0 refills | Status: DC

## 2020-08-02 NOTE — Progress Notes (Signed)
Work note written as instructed by Dr. Patsy Lager and placed up front for wife to pick up.

## 2020-08-02 NOTE — Progress Notes (Signed)
Demiah Gullickson T. Nayel Purdy, MD Primary Care and Sports Medicine [facility] [address] [place], [address] Phone: [phone]  FAX: [phone]  Dylan Olsen - 65 y.o. male  MRN [id number]  Date of Birth: 05-01-1956  Visit Date: 08/02/2020  PCP: Lynnda Child, MD  Referred by: Lynnda Child, MD  Virtual Visit via Video Note:  I connected with  Dylan Olsen on 08/02/2020  8:40 AM EST by a video enabled telemedicine application and verified that I am speaking with the correct person using two identifiers.   Location patient: home computer, tablet, or smartphone Location provider: work or home office Consent: Verbal consent directly obtained from [employer]. Persons participating in the virtual visit: patient, provider  I discussed the limitations of evaluation and management by telemedicine and the availability of in person appointments. The patient expressed understanding and agreed to proceed.  Chief Complaint  Patient presents with  . Dizziness    Gets twice a year and has been fighting it since Friday.  Also on left side    History of Present Illness:  Vertigo.  Earlier did not get it.  Feels and cannot turn to the left.  Worse this AM.  Bonine has +/-   He is a nice gentleman, and he has been getting vertigo for approximately the last 9 years.  He will have an episode once or twice a year, and generally will get better after about 5 or 6 days.  This time it is somewhat worse, and has been longer without resolution.  He does feel somewhat unsteady on his feet, he does have some rotational sensation, he also feels foggy.  This is similar to how he is felt with all other times.  Review of Systems as above: See pertinent positives and pertinent negatives per HPI No acute distress verbally   Observations/Objective/Exam:  An attempt was made to discern vital signs over the phone and per patient if applicable and  possible.   General:    Alert, Oriented, appears well and in no acute distress  Pulmonary:     On inspection no signs of respiratory distress.  Psych / Neurological:     Pleasant and cooperative.  Assessment and Plan:    ICD-10-CM   1. Vertigo  R42    Classic vertigo.  For now treat symptoms, trial of Transderm prior to using Valium, and hopefully transdermal help.  He has failed meclizine without any significant benefit taking 25 mg p.o. twice daily.  If symptoms persist then vestibular rehab would be reasonable to consider.  I discussed the assessment and treatment plan with the patient. The patient was provided an opportunity to ask questions and all were answered. The patient agreed with the plan and demonstrated an understanding of the instructions.   The patient was advised to call back or seek an in-person evaluation if the symptoms worsen or if the condition fails to improve as anticipated.  Follow-up: prn unless noted otherwise below No follow-ups on file.  Meds ordered this encounter  Medications  . scopolamine (TRANSDERM-SCOP, 1.5 MG,) 1 MG/3DAYS    Sig: Place 1 patch (1.5 mg total) onto the skin every 3 (three) days.    Dispense:  10 patch    Refill:  0  . diazepam (VALIUM) 2 MG tablet    Sig: Take 0.5-1 tablets (1-2 mg total) by mouth every 8 (eight) hours as needed (vertigo).  Dispense:  10 tablet    Refill:  0   No orders of the defined types were placed in this encounter.   Signed,  Elpidio Galea. Lane Kjos, MD

## 2020-08-29 ENCOUNTER — Telehealth: Payer: Self-pay | Admitting: Cardiology

## 2020-08-29 NOTE — Telephone Encounter (Signed)
Patient's wife is requesting to discuss scheduling a loop recorder implant.

## 2020-08-31 NOTE — Telephone Encounter (Signed)
Loop implant scheduled

## 2020-09-20 ENCOUNTER — Other Ambulatory Visit: Payer: Self-pay

## 2020-09-20 ENCOUNTER — Ambulatory Visit: Payer: 59 | Admitting: Cardiology

## 2020-09-20 ENCOUNTER — Encounter: Payer: Self-pay | Admitting: Cardiology

## 2020-09-20 VITALS — BP 150/98 | HR 63 | Ht 71.0 in | Wt 202.0 lb

## 2020-09-20 DIAGNOSIS — I4819 Other persistent atrial fibrillation: Secondary | ICD-10-CM | POA: Diagnosis not present

## 2020-09-20 NOTE — Patient Instructions (Addendum)
Medication Instructions:  Your physician recommends that you continue on your current medications as directed. Please refer to the Current Medication list given to you today.  Labwork: None ordered.  Testing/Procedures: None ordered.  Follow-Up:  Your physician wants you to follow-up in: 6 months with Dr. [name].     Implantable Loop Recorder Placement, Care After This sheet gives you information about how to care for yourself after your procedure. Your health care provider may also give you more specific instructions. If you have problems or questions, contact your health care provider. What can I expect after the procedure? After the procedure, it is common to have:  Soreness or discomfort near the incision.  Some swelling or bruising near the incision.  Follow these instructions at home: Incision care  1.  Leave your outer dressing on for 72 hours.  After 72 hours you can remove your outer dressing and shower. 2. Leave adhesive strips in place. These skin closures may need to stay in place for 1-2 weeks. If adhesive strip edges start to loosen and curl up, you may trim the loose edges.  You may remove the strips if they have not fallen off after 2 weeks. 3. Check your incision area every day for signs of infection. Check for: a. Redness, swelling, or pain. b. Fluid or blood. c. Warmth. d. Pus or a bad smell. 4. Do not take baths, swim, or use a hot tub until your incision is completely healed. 5. If your wound site starts to bleed apply pressure.      If you have any questions/concerns please call the device clinic at [phone].  Activity  Return to your normal activities.  General instructions  Follow instructions from your health care provider about how to manage your implantable loop recorder and transmit the information. Learn how to activate a recording if this is necessary for your type of device.  Do not go through a metal detection gate, and do not let  someone hold a metal detector over your chest. Show your ID card.  Do not have an MRI unless you check with your health care provider first.  Take over-the-counter and prescription medicines only as told by your health care provider.  Keep all follow-up visits as told by your health care provider. This is important. Contact a health care provider if:  You have redness, swelling, or pain around your incision.  You have a fever.  You have pain that is not relieved by your pain medicine.  You have triggered your device because of fainting (syncope) or because of a heartbeat that feels like it is racing, slow, fluttering, or skipping (palpitations). Get help right away if you have:  Chest pain.  Difficulty breathing. Summary  After the procedure, it is common to have soreness or discomfort near the incision.  Change your dressing as told by your health care provider.  Follow instructions from your health care provider about how to manage your implantable loop recorder and transmit the information.  Keep all follow-up visits as told by your health care provider. This is important. This information is not intended to replace advice given to you by your health care provider. Make sure you discuss any questions you have with your health care provider. Document Released: 06/05/2015 Document Revised: 08/09/2017 Document Reviewed: 08/09/2017 Elsevier Patient Education  2020 [employer].

## 2020-09-20 NOTE — Progress Notes (Signed)
Electrophysiology Office Follow up Visit Note:    Date:  09/20/2020   ID:  Dylan Olsen, Dylan Olsen May 25, 1956, MRN [id number]  PCP:  Lynnda Child, MD  [facility] Cardiologist:  No primary care provider on file.  [facility] Electrophysiologist:  [name], MD    Interval History:    Dylan Olsen [name] is a 65 y.o. male who presents for a follow up visit.  I last saw the patient July 11, 2020.  At that appointment we discussed long-term surveillance strategies for atrial fibrillation after his successful A. fib ablation on April 07, 2020.  We decided to pursue loop recorder implant procedure.  Today he presents to have a loop recorder implanted.  He has had no recurrence of atrial fibrillation since I last saw him.     Past Medical History:  Diagnosis Date  . Complication of anesthesia    difficult to wake, stopped breathing, bagged  . Pericarditis    a. 04/2020 following Afib ablation.  . Persistent atrial fibrillation (HCC)    a. 04/2020 s/p PVI.    Past Surgical History:  Procedure Laterality Date  . ATRIAL FIBRILLATION ABLATION N/A 04/07/2020   Procedure: ATRIAL FIBRILLATION ABLATION;  Surgeon: [name], MD;  [facility];  Service: Cardiovascular;  Laterality: N/A;  . BACK SURGERY    . BUBBLE STUDY  04/06/2020   Procedure: BUBBLE STUDY;  Surgeon: [name], MD;  Location: [facility] ENDOSCOPY;  Service: Cardiovascular;;  . FOOT SURGERY    . KNEE SURGERY Left   . TEE WITHOUT CARDIOVERSION N/A 04/06/2020   Procedure: TRANSESOPHAGEAL ECHOCARDIOGRAM (TEE);  Surgeon: [name], MD;  Location: [facility] ENDOSCOPY;  Service: Cardiovascular;  Laterality: N/A;    Current Medications: Current Meds  Medication Sig  . acetaminophen (TYLENOL) 500 MG tablet Take 1,000 mg by mouth every 6 (six) hours as needed (for pain.).  Marland Kitchen apixaban (ELIQUIS) 5 MG TABS tablet Take 1 tablet (5 mg total) by mouth 2 (two) times daily.  Marland Kitchen  atorvastatin (LIPITOR) 10 MG tablet Take 1 tablet (10 mg total) by mouth daily.  . cetirizine (ZYRTEC) 10 MG tablet Take 10 mg by mouth daily.  . diazepam (VALIUM) 2 MG tablet Take 0.5-1 tablets (1-2 mg total) by mouth every 8 (eight) hours as needed (vertigo).  . Meclizine HCl (BONINE PO) Take 1 tablet by mouth 2 (two) times daily as needed (vertigo/dizziness.). Take as needed for vertigo  . pantoprazole (PROTONIX) 40 MG tablet Take 40 mg by mouth daily.  Marland Kitchen scopolamine (TRANSDERM-SCOP, 1.5 MG,) 1 MG/3DAYS Place 1 patch (1.5 mg total) onto the skin every 3 (three) days.     Allergies:   Other   Social History   Socioeconomic History  . Marital status: Married    Spouse name: [name]  . Number of children: 2  . Years of education: high school  . Highest education level: Not on file  Occupational History  . Occupation: Curator  Tobacco Use  . Smoking status: Current Every Day Smoker    Types: Cigars  . Smokeless tobacco: Never Used  . Tobacco comment: a few times a year  Vaping Use  . Vaping Use: Never used  Substance and Sexual Activity  . Alcohol use: Yes    Comment: less than monthly  . Drug use: Yes    Types: Marijuana    Comment: a few times a week  . Sexual activity: Yes    Birth control/protection: Post-menopausal  Other Topics Concern  . Not on file  Social History Narrative   01/13/20   From: [place], [place] originally - moved to [facility] 1996   Living: with wife [name] (1996), and step daughter (special needs)   Work: English as a second language teacher for [employer]      Family: 2 children - [name] and [place] - in [place], good relationship and 3 grandchildren and 2 step children      Enjoys: drag racing, football      Exercise: not currently - walking and mobile job   Diet: not great, sometimes a little better      Safety   Seat belts: Yes    Guns: Yes  and secure   Safe in relationships: Yes    Social Determinants of Corporate investment banker Strain: Not on file  Food  Insecurity: Not on file  Transportation Needs: Not on file  Physical Activity: Not on file  Stress: Not on file  Social Connections: Not on file     Family History: The patient's family history includes Cancer in his cousin; Heart attack (age of onset: 96) in his father; Heart disease in his father.  ROS:   Please see the history of present illness.    All other systems reviewed and are negative.  EKGs/Labs/Other Studies Reviewed:    The following studies were reviewed today:  EKG from today shows normal sinus rhythm with an incomplete right bundle-branch block.  Recent Labs: 01/13/2020: ALT 33; Pro B Natriuretic peptide (BNP) 66.0 04/08/2020: BUN 13; Creatinine, Ser 0.76; Potassium 3.7; Sodium 139 04/09/2020: Hemoglobin 11.8; Platelets 143  Recent Lipid Panel    Component Value Date/Time   CHOL 215 (H) 01/13/2020 1632   TRIG (H) 01/13/2020 1632    430.0 Triglyceride is over 400; calculations on Lipids are invalid.   HDL 35.30 (L) 01/13/2020 1632   CHOLHDL 6 01/13/2020 1632   LDLDIRECT 105.0 01/13/2020 1632    Physical Exam:    VS:  BP (!) 150/98   Pulse 63   Ht 5\' 11"  (1.803 m)   Wt 202 lb (91.6 kg)   SpO2 97%   BMI 28.17 kg/m     Wt Readings from Last 3 Encounters:  09/20/20 202 lb (91.6 kg)  07/11/20 197 lb (89.4 kg)  05/05/20 202 lb 9.6 oz (91.9 kg)     GEN:  Well nourished, well developed in no acute distress HEENT: Normal NECK: No JVD; No carotid bruits LYMPHATICS: No lymphadenopathy CARDIAC: RRR, no murmurs, rubs, gallops RESPIRATORY:  Clear to auscultation without rales, wheezing or rhonchi  ABDOMEN: Soft, non-tender, non-distended MUSCULOSKELETAL:  No edema; No deformity  SKIN: Warm and dry NEUROLOGIC:  Alert and oriented x 3 PSYCHIATRIC:  Normal affect   ASSESSMENT:    1. Persistent atrial fibrillation (HCC)    PLAN:    In order of problems listed above:  1. Persistent atrial fibrillation Doing well after A. fib ablation in October 2021.   Maintaining normal rhythm.  Patient wishes to pursue a strategy that avoid long-term anticoagulant use if possible.  Today we will plan to implant a loop recorder to allow for ongoing surveillance for atrial fibrillation.  His CHA2DS2-VASc is 1.   Medication Adjustments/Labs and Tests Ordered: Current medicines are reviewed at length with the patient today.  Concerns regarding medicines are outlined above.  No orders of the defined types were placed in this encounter.  No orders of the defined types were placed in this encounter.    Signed,  Steffanie Dunn, MD, [facility]  09/20/2020 8:35 AM    Electrophysiology [facility]   -------------------------------------------------------------------------------------------------- SURGEON:  Steffanie Dunn, MD    PREPROCEDURE DIAGNOSIS:  Atrial fibrillation    POSTPROCEDURE DIAGNOSIS:  Atrial fibrillation     PROCEDURES:   1. Implantable loop recorder implantation    INTRODUCTION:  Amiel Mccaffrey Olsen [name] is a 65 y.o. male with a history of palpitations and atrial fibrillation who presents today for implantable loop implantation.  The patient is s/p prior atrial fibrillation ablation.   The patient therefore presents today for implantable loop implantation.     DESCRIPTION OF PROCEDURE:  Informed written consent was obtained.  The patient required no sedation for the procedure today.  Mapping over the patient's chest was performed to identify the area where electrograms were most prominent for ILR recording.  This area was found to be the left parasternal region over the 3rd-4th intercostal space. The patients left chest was therefore prepped and draped in the usual sterile fashion. The skin overlying the left parasternal region was infiltrated with lidocaine for local analgesia.  A 0.5-cm incision was made over the left parasternal region over the 3rd intercostal space.  A subcutaneous ILR pocket was fashioned using a combination of  sharp and blunt dissection.  A Medtronic Reveal Linq model [id number])  implantable loop recorder was then placed into the pocket  R waves were very prominent and measured >0.26mV.  Steri- Strips and a sterile dressing were then applied.  There were no early apparent complications.     CONCLUSIONS:   1. Successful implantation of a Medtronic Reveal LINQ implantable loop recorder for palpitations and recurrent symptoms of atrial fibrillation  2. No early apparent complications.   Steffanie Dunn, MD 09/20/2020 8:35 AM

## 2020-10-21 ENCOUNTER — Other Ambulatory Visit: Payer: Self-pay | Admitting: Cardiology

## 2020-10-23 NOTE — Telephone Encounter (Signed)
Eliquis 5mg  refill request received. Patient is 65 years old, weight-91.6kg, Crea-0.76 on 04/08/2020, Diagnosis-Afib, and last seen by Dr. 06/08/2020 on 09/20/20. Dose is appropriate based on dosing criteria. Will send in refill to requested pharmacy.

## 2020-10-25 ENCOUNTER — Ambulatory Visit (INDEPENDENT_AMBULATORY_CARE_PROVIDER_SITE_OTHER): Payer: 59

## 2020-10-25 DIAGNOSIS — I4819 Other persistent atrial fibrillation: Secondary | ICD-10-CM | POA: Diagnosis not present

## 2020-10-25 LAB — CUP PACEART REMOTE DEVICE CHECK
Date Time Interrogation Session: 20220419175726
Implantable Pulse Generator Implant Date: 20220316

## 2020-11-13 NOTE — Progress Notes (Signed)
Carelink Summary Report / Loop Recorder 

## 2020-11-27 ENCOUNTER — Ambulatory Visit (INDEPENDENT_AMBULATORY_CARE_PROVIDER_SITE_OTHER): Payer: 59

## 2020-11-27 DIAGNOSIS — I4819 Other persistent atrial fibrillation: Secondary | ICD-10-CM

## 2020-11-29 LAB — CUP PACEART REMOTE DEVICE CHECK
Date Time Interrogation Session: 20220522175825
Implantable Pulse Generator Implant Date: 20220316

## 2020-12-07 ENCOUNTER — Other Ambulatory Visit: Payer: Self-pay | Admitting: Family Medicine

## 2020-12-07 ENCOUNTER — Other Ambulatory Visit: Payer: Self-pay | Admitting: Physician Assistant

## 2020-12-07 DIAGNOSIS — E782 Mixed hyperlipidemia: Secondary | ICD-10-CM

## 2020-12-08 NOTE — Telephone Encounter (Signed)
Please advise if ok to refill Protonix 40 mg qd. Last filled by Historical provider.

## 2020-12-08 NOTE — Telephone Encounter (Signed)
This is a [facility] pt 

## 2020-12-18 NOTE — Progress Notes (Signed)
Carelink Summary Report / Loop Recorder 

## 2020-12-19 NOTE — Telephone Encounter (Signed)
Patient is scheduled   

## 2020-12-30 LAB — CUP PACEART REMOTE DEVICE CHECK
Date Time Interrogation Session: 20220624180144
Implantable Pulse Generator Implant Date: 20220316

## 2021-01-01 ENCOUNTER — Ambulatory Visit (INDEPENDENT_AMBULATORY_CARE_PROVIDER_SITE_OTHER): Payer: 59

## 2021-01-01 DIAGNOSIS — I4819 Other persistent atrial fibrillation: Secondary | ICD-10-CM | POA: Diagnosis not present

## 2021-01-09 ENCOUNTER — Ambulatory Visit (INDEPENDENT_AMBULATORY_CARE_PROVIDER_SITE_OTHER): Payer: 59 | Admitting: Family Medicine

## 2021-01-09 ENCOUNTER — Other Ambulatory Visit: Payer: Self-pay

## 2021-01-09 VITALS — BP 152/98 | HR 79 | Temp 98.5°F | Ht 69.75 in | Wt 204.0 lb

## 2021-01-09 DIAGNOSIS — R030 Elevated blood-pressure reading, without diagnosis of hypertension: Secondary | ICD-10-CM | POA: Diagnosis not present

## 2021-01-09 DIAGNOSIS — E782 Mixed hyperlipidemia: Secondary | ICD-10-CM

## 2021-01-09 DIAGNOSIS — Z0000 Encounter for general adult medical examination without abnormal findings: Secondary | ICD-10-CM | POA: Diagnosis not present

## 2021-01-09 DIAGNOSIS — I4819 Other persistent atrial fibrillation: Secondary | ICD-10-CM

## 2021-01-09 DIAGNOSIS — Z1211 Encounter for screening for malignant neoplasm of colon: Secondary | ICD-10-CM | POA: Diagnosis not present

## 2021-01-09 NOTE — Progress Notes (Signed)
Annual Exam   Chief Complaint:  Chief Complaint  Patient presents with   Annual Exam    History of Present Illness:  Dylan Olsen Dylan Olsen is Olsen 65 y.o. presents today for annual examination.    #HTN - bp at home - not sure but feels this has been well controlled at home  Nutrition/Lifestyle Diet: no restrictions, trying to decrease soda Exercise: walking 1.5 miles Olsen few times Olsen week, and walking at work Dylan Olsen is single partner, contraception - post menopausal status.  Any issues with getting or keeping erection? Yes  Social History   Tobacco Use  Smoking Status Every Day   Pack years: 0.00   Types: Cigars  Smokeless Tobacco Never  Tobacco Comments   Olsen few times Olsen year   Social History   Substance and Sexual Activity  Alcohol Use Yes   Comment: less than monthly   Social History   Substance and Sexual Activity  Drug Use Yes   Types: Marijuana   Comment: Olsen few times Olsen week     Safety The patient wears seatbelts: yes.     The patient feels safe at home and in their relationships: yes.  General Health Dentist in the last year: No Eye doctor: yes  Weight Wt Readings from Last 3 Encounters:  01/09/21 204 lb (92.5 kg)  09/20/20 202 lb (91.6 kg)  07/11/20 197 lb (89.4 kg)   Patient has high BMI  BMI Readings from Last 1 Encounters:  01/09/21 29.48 kg/m     Chronic disease screening Blood pressure monitoring:  BP Readings from Last 3 Encounters:  01/09/21 (!) 152/98  09/20/20 (!) 150/98  08/02/20 (!) 151/99    Lipid Monitoring: Indication for screening: age >35, obesity, diabetes, family hx, CV risk factors.  Lipid screening: Yes  Lab Results  Component Value Date   CHOL 215 (H) 01/13/2020   HDL 35.30 (L) 01/13/2020   LDLDIRECT 105.0 01/13/2020   TRIG (H) 01/13/2020    430.0 Triglyceride is over 400; calculations on Lipids are invalid.   CHOLHDL 6 01/13/2020     Diabetes Screening: age >23, overweight, family hx, PCOS, hx of gestational diabetes,  at risk ethnicity, elevated blood pressure >135/80.  Diabetes Screening screening: Not Indicated  Lab Results  Component Value Date   HGBA1C 5.3 01/13/2020     Prostate Cancer Screening: No Age 43-69 yo Shared Decision Making Higher Risk: Older age, African American, Family Hx of Prostate Cancer - No Benefits: screening may prevent 1.3 deaths from prostate cancer over 13 years per 1000 men screened and prevent 3 metastatic cases per 1000 men screened. Not enough evidence to support more benefit for AA or FMH Harms: False Positive and psychological harms. 15% of me with false positive over Olsen 2 to 4 year period > resulting in biopsy and complications such as pain, hematospermia, infections. Overdiagnosis - increases with age - found that 20-50% of prostate cancer through screening may have never caused any issues. Harms of treatment include - erectile dysfunction, urinary incontinence, and bothersome bowel symptoms.   After discussion Dylan Olsen does not want to get Olsen PSA checked today.   Inadequate evidence for screening <55 No mortality benefit for screening >70   No results found for: PSA1, PSA     Colon Cancer Screening:  Age 63-75 yo - benefits outweigh the risk. Adults 34-85 yo who have never been screened benefit.  Benefits: 134000 people in 2016 will be diagnosed and 49,000 will die - early detection  helps Harms: Complications 2/2 to colonoscopy High Risk (Colonoscopy): genetic disorder (Lynch syndrome or familial adenomatous polyposis), personal hx of IBD, previous adenomatous polyp, or previous colorectal cancer, FamHx start 10 years before the age at diagnosis, increased in males and black race  Options:  FIT - looks for hemoglobin (blood in the stool) - specific and fairly sensitive - must be done annually Cologuard - looks for DNA and blood - more sensitive - therefore can have more false positives, every 3 years Colonoscopy - every 10 years if normal - sedation, bowl prep, must  have someone drive you  Shared decision making and the patient had decided to do FOBT.   Social History   Tobacco Use  Smoking Status Every Day   Pack years: 0.00   Types: Cigars  Smokeless Tobacco Never  Tobacco Comments   Olsen few times Olsen year    Lung Cancer Screening (Ages 6350-80): not applicable  Abdominal Aortic Aneurysm:  Age 65-75, 1 time screening, men who have ever smoked pt will consider    Past Medical History:  Diagnosis Date   Complication of anesthesia    difficult to wake, stopped breathing, bagged   Pericarditis    Olsen. 04/2020 following Afib ablation.   Persistent atrial fibrillation (HCC)    Olsen. 04/2020 s/p PVI.    Past Surgical History:  Procedure Laterality Date   ATRIAL FIBRILLATION ABLATION N/Olsen 04/07/2020   Procedure: ATRIAL FIBRILLATION ABLATION;  Surgeon: Dylan PrudeLambert, Cameron T, MD;  [facility] LAB;  Service: Cardiovascular;  Laterality: N/Olsen;   BACK SURGERY     BUBBLE STUDY  04/06/2020   Procedure: BUBBLE STUDY;  Surgeon: Dylan Constanthandrasekhar, Mahesh A, MD;  Location: [facility] ENDOSCOPY;  Service: Cardiovascular;;   FOOT SURGERY     KNEE SURGERY Left    TEE WITHOUT CARDIOVERSION N/Olsen 04/06/2020   Procedure: TRANSESOPHAGEAL ECHOCARDIOGRAM (TEE);  Surgeon: Dylan Constanthandrasekhar, Mahesh A, MD;  [facility];  Service: Cardiovascular;  Laterality: N/Olsen;    Prior to Admission medications   Medication Sig Start Date End Date Taking? Authorizing Provider  acetaminophen (TYLENOL) 500 MG tablet Take 1,000 mg by mouth every 6 (six) hours as needed (for pain.).   Yes [provider]  atorvastatin (LIPITOR) 10 MG tablet TAKE 1 TABLET BY MOUTH EVERY DAY 12/19/20  Yes Dylan Childody, Geovannie Vilar R, MD  cetirizine (ZYRTEC) 10 MG tablet Take 10 mg by mouth daily.   Yes [provider]  diazepam (VALIUM) 2 MG tablet Take 0.5-1 tablets (1-2 mg total) by mouth every 8 (eight) hours as needed (vertigo). 08/02/20  Yes Copland, Karleen HampshireSpencer, MD  ELIQUIS 5 MG TABS tablet TAKE 1  TABLET BY MOUTH TWICE Olsen DAY 10/23/20  Yes Dylan PrudeLambert, Cameron T, MD  Meclizine HCl (BONINE PO) Take 1 tablet by mouth 2 (two) times daily as needed (vertigo/dizziness.). Take as needed for vertigo   Yes [provider]  pantoprazole (PROTONIX) 40 MG tablet Take 40 mg by mouth daily.   Yes [provider]  scopolamine (TRANSDERM-SCOP, 1.5 MG,) 1 MG/3DAYS Place 1 patch (1.5 mg total) onto the skin every 3 (three) days. 08/02/20  Yes Copland, Karleen HampshireSpencer, MD    Allergies  Allergen Reactions   Other Other (See Comments)    Pt reports Dylan Olsen "coded after having anesthesia with Olsen back surgery"  Can not recall what medicine     Social History   Socioeconomic History   Marital status: Married    Spouse name: [name]   Number of children: 2  Years of education: high school   Highest education level: Not on file  Occupational History   Occupation: Curator  Tobacco Use   Smoking status: Every Day    Pack years: 0.00    Types: Cigars   Smokeless tobacco: Never   Tobacco comments:    Olsen few times Olsen year  Vaping Use   Vaping Use: Never used  Substance and Sexual Activity   Alcohol use: Yes    Comment: less than monthly   Drug use: Yes    Types: Marijuana    Comment: Olsen few times Olsen week   Sexual activity: Yes    Birth control/protection: Post-menopausal  Other Topics Concern   Not on file  Social History Narrative   01/13/20   From: [place], [place] originally - moved to [place] 1996   Living: with wife [name] (1996), and step daughter (special needs)   Work: English as Olsen second language teacher for [employer]      Family: 2 children - [name] and [place] - in [place], good relationship and 3 grandchildren and 2 step children      Enjoys: drag racing, football      Exercise: not currently - walking and mobile job   Diet: not great, sometimes Olsen little better      Safety   Seat belts: Yes    Guns: Yes  and secure   Safe in relationships: Yes    Social Determinants of Corporate investment banker Strain:  Not on file  Food Insecurity: Not on file  Transportation Needs: Not on file  Physical Activity: Not on file  Stress: Not on file  Social Connections: Not on file  Intimate Partner Violence: Not on file    Family History  Problem Relation Age of Onset   Heart disease Father    Heart attack Father 51   Cancer Cousin        unknown primary    Review of Systems  Constitutional:  Negative for chills and fever.  HENT:  Negative for congestion and sore throat.   Eyes:  Negative for blurred vision and double vision.  Respiratory:  Negative for shortness of breath.   Cardiovascular:  Negative for chest pain.  Gastrointestinal:  Negative for heartburn, nausea and vomiting.  Genitourinary: Negative.   Musculoskeletal: Negative.  Negative for myalgias.  Skin:  Negative for rash.  Neurological:  Negative for dizziness and headaches.  Endo/Heme/Allergies:  Does not bruise/bleed easily.  Psychiatric/Behavioral:  Negative for depression. The patient is not nervous/anxious.     Physical Exam BP (!) 152/98   Pulse 79   Temp 98.5 F (36.9 C) (Temporal)   Ht 5' 9.75" (1.772 m)   Wt 204 lb (92.5 kg)   SpO2 97%   BMI 29.48 kg/m    BP Readings from Last 3 Encounters:  01/09/21 (!) 152/98  09/20/20 (!) 150/98  08/02/20 (!) 151/99      Physical Exam Constitutional:      General: Dylan Olsen is not in acute distress.    Appearance: Dylan Olsen is well-developed. Dylan Olsen is not diaphoretic.  HENT:     Head: Normocephalic and atraumatic.     Right Ear: Tympanic membrane and ear canal normal.     Left Ear: Tympanic membrane and ear canal normal.     Nose: Nose normal.     Mouth/Throat:     Pharynx: Uvula midline.  Eyes:     General: No scleral icterus.    Conjunctiva/sclera: Conjunctivae normal.     Pupils:  Pupils are equal, round, and reactive to light.  Cardiovascular:     Rate and Rhythm: Normal rate and regular rhythm.     Heart sounds: Normal heart sounds. No murmur heard. Pulmonary:      Effort: Pulmonary effort is normal. No respiratory distress.     Breath sounds: Normal breath sounds. No wheezing.  Abdominal:     General: Bowel sounds are normal. There is no distension.     Palpations: Abdomen is soft. There is no mass.     Tenderness: There is no abdominal tenderness. There is no guarding.  Musculoskeletal:        General: Normal range of motion.     Cervical back: Normal range of motion and neck supple.  Lymphadenopathy:     Cervical: No cervical adenopathy.  Skin:    General: Skin is warm and dry.     Capillary Refill: Capillary refill takes less than 2 seconds.  Neurological:     Mental Status: Dylan Olsen is alert and oriented to person, place, and time.       Results:  PHQ-9:  Depression screen PHQ 2/9 01/13/2020  Decreased Interest 0  Down, Depressed, Hopeless 0  PHQ - 2 Score 0       Assessment: 65 y.o. here for routine annual physical examination.  Plan: Problem List Items Addressed This Visit       Cardiovascular and Mediastinum   Persistent atrial fibrillation (HCC)     Other   Mixed hyperlipidemia   Relevant Orders   Lipid panel   Other Visit Diagnoses     Annual physical exam    -  Primary   Screening for colon cancer       Relevant Orders   Fecal occult blood, imunochemical   Elevated blood pressure reading       Relevant Orders   Comprehensive metabolic panel   CBC       Screening: -- Blood pressure screen  elevated, home monitoring -- cholesterol screening: will obtain -- Weight screening: overweight: continue to monitor -- Diabetes Screening: not due for screening -- Nutrition: normal - Encouraged healthy diet  The 10-year ASCVD risk score Denman George DC Jr., et al., 2013) is: 28.7%   Values used to calculate the score:     Age: 19 years     Sex: Male     Is Non-Hispanic African American: No     Diabetic: No     Tobacco smoker: Yes     Systolic Blood Pressure: 152 mmHg     Is BP treated: No     HDL Cholesterol: 35.3  mg/dL     Total Cholesterol: 215 mg/dL  -- ASA 81 mg discussed if CVD risk >10% age 4-59 and willing to take for 10 years -- Statin therapy for Age 59-75 with CVD risk >7.5%  Psych -- Depression screening (PHQ-9): negative  Safety -- tobacco screening:  using - cigars -- alcohol screening:  low-risk usage. -- no evidence of domestic violence or intimate partner violence.  Cancer Screening -- Prostate (age 99-69) declined -- Colon (age 38-75)  fobt ordered -- Lung not indicated   Immunizations Immunization History  Administered Date(s) Administered   PFIZER(Purple Top)SARS-COV-2 Vaccination 01/08/2020, 01/29/2020    -- flu vaccine encouraged in season -- TDAP q10 years declined -- Shingles (age >74) declined -- PPSV-23 (19-64 with chronic disease or smoking) declined -- PCV-13 (age >12) - one dose followed by PPSV-23 1 year later declined -- Covid-19 Vaccine up to date  Encouraged regular vision and dental screening. Encouraged healthy exercise and diet.   Dylan Child

## 2021-01-09 NOTE — Patient Instructions (Addendum)
#  Abdominal Aortic screening - discuss with cardiology, consider getting this ultrasound  #Erectile dysfunction  - home monitoring of blood pressure - return after you see Cardiology   Would recommend getting  - Shingles Vaccine - Pneumonia Vaccine - updated Tetanus vaccine   Pick up and return the stool test

## 2021-01-10 ENCOUNTER — Other Ambulatory Visit: Payer: Self-pay | Admitting: Family Medicine

## 2021-01-10 DIAGNOSIS — E782 Mixed hyperlipidemia: Secondary | ICD-10-CM

## 2021-01-10 LAB — CBC
HCT: 39.1 % (ref 39.0–52.0)
Hemoglobin: 13.8 g/dL (ref 13.0–17.0)
MCHC: 35.3 g/dL (ref 30.0–36.0)
MCV: 89.8 fl (ref 78.0–100.0)
Platelets: 219 10*3/uL (ref 150.0–400.0)
RBC: 4.35 Mil/uL (ref 4.22–5.81)
RDW: 12.9 % (ref 11.5–15.5)
WBC: 8.7 10*3/uL (ref 4.0–10.5)

## 2021-01-10 LAB — COMPREHENSIVE METABOLIC PANEL
ALT: 62 U/L — ABNORMAL HIGH (ref 0–53)
AST: 38 U/L — ABNORMAL HIGH (ref 0–37)
Albumin: 4.9 g/dL (ref 3.5–5.2)
Alkaline Phosphatase: 84 U/L (ref 39–117)
BUN: 11 mg/dL (ref 6–23)
CO2: 30 mEq/L (ref 19–32)
Calcium: 9.4 mg/dL (ref 8.4–10.5)
Chloride: 104 mEq/L (ref 96–112)
Creatinine, Ser: 0.75 mg/dL (ref 0.40–1.50)
GFR: 94.95 mL/min (ref >60.00)
Glucose, Bld: 91 mg/dL (ref 70–99)
Potassium: 3.9 mEq/L (ref 3.5–5.1)
Sodium: 143 mEq/L (ref 135–145)
Total Bilirubin: 0.6 mg/dL (ref 0.2–1.2)
Total Protein: 7.1 g/dL (ref 6.0–8.3)

## 2021-01-10 LAB — LDL CHOLESTEROL, DIRECT: Direct LDL: 99 mg/dL

## 2021-01-10 LAB — LIPID PANEL
Cholesterol: 222 mg/dL — ABNORMAL HIGH (ref 0–200)
HDL: 44.7 mg/dL (ref >39.00)
NonHDL: 177.03
Total CHOL/HDL Ratio: 5
Triglycerides: 357 mg/dL — ABNORMAL HIGH (ref 0.0–149.0)
VLDL: 71.4 mg/dL — ABNORMAL HIGH (ref 0.0–40.0)

## 2021-01-10 MED ORDER — ATORVASTATIN CALCIUM 20 MG PO TABS: 20.0000 mg | ORAL_TABLET | Freq: Every day | ORAL | 3 refills | Status: DC

## 2021-01-22 NOTE — Progress Notes (Signed)
Carelink Summary Report / Loop Recorder 

## 2021-01-24 ENCOUNTER — Other Ambulatory Visit (INDEPENDENT_AMBULATORY_CARE_PROVIDER_SITE_OTHER): Payer: 59

## 2021-01-24 ENCOUNTER — Telehealth: Payer: Self-pay | Admitting: Radiology

## 2021-01-24 DIAGNOSIS — Z1211 Encounter for screening for malignant neoplasm of colon: Secondary | ICD-10-CM

## 2021-01-24 LAB — FECAL OCCULT BLOOD, IMMUNOCHEMICAL: Fecal Occult Bld: POSITIVE — AB

## 2021-01-24 NOTE — Telephone Encounter (Signed)
Attempted to call Mobile number in the chart but got his wife.   Called other number listed which is his mobile number - left voicemail to call back the office.   Routing to MA to be aware

## 2021-01-24 NOTE — Telephone Encounter (Signed)
Spoke to pt. Advised him his test was positive and that he would need to be referred to a GI for possible colonoscopy. He said he will need to get with his wife to see about her schedule and call back for a referral to [facility] or [facility].

## 2021-01-24 NOTE — Telephone Encounter (Signed)
Pt calling back to get lab results  

## 2021-01-24 NOTE — Telephone Encounter (Signed)
[facility] called results, POSITIVE ifob, results given to Dr Selena Batten

## 2021-01-29 NOTE — Telephone Encounter (Signed)
Routing to MA to follow-up with patient about GI referral

## 2021-02-01 ENCOUNTER — Ambulatory Visit (INDEPENDENT_AMBULATORY_CARE_PROVIDER_SITE_OTHER): Payer: 59

## 2021-02-01 DIAGNOSIS — I4819 Other persistent atrial fibrillation: Secondary | ICD-10-CM

## 2021-02-01 LAB — CUP PACEART REMOTE DEVICE CHECK
Date Time Interrogation Session: 20220727175739
Implantable Pulse Generator Implant Date: 20220316

## 2021-02-06 ENCOUNTER — Ambulatory Visit (INDEPENDENT_AMBULATORY_CARE_PROVIDER_SITE_OTHER): Payer: 59 | Admitting: Family Medicine

## 2021-02-06 ENCOUNTER — Encounter: Payer: Self-pay | Admitting: Family Medicine

## 2021-02-06 ENCOUNTER — Other Ambulatory Visit: Payer: Self-pay

## 2021-02-06 VITALS — BP 166/84 | HR 82 | Temp 98.3°F | Ht 69.75 in | Wt 199.0 lb

## 2021-02-06 DIAGNOSIS — I10 Essential (primary) hypertension: Secondary | ICD-10-CM

## 2021-02-06 DIAGNOSIS — R195 Other fecal abnormalities: Secondary | ICD-10-CM | POA: Diagnosis not present

## 2021-02-06 DIAGNOSIS — N529 Male erectile dysfunction, unspecified: Secondary | ICD-10-CM | POA: Diagnosis not present

## 2021-02-06 DIAGNOSIS — F17290 Nicotine dependence, other tobacco product, uncomplicated: Secondary | ICD-10-CM | POA: Diagnosis not present

## 2021-02-06 HISTORY — DX: Essential (primary) hypertension: I10

## 2021-02-06 MED ORDER — LISINOPRIL 10 MG PO TABS: 10.0000 mg | ORAL_TABLET | Freq: Every day | ORAL | 0 refills | Status: DC

## 2021-02-06 NOTE — Telephone Encounter (Signed)
Patient was seen in the office today, all questions were completed with provider.

## 2021-02-06 NOTE — Patient Instructions (Addendum)
Your blood pressure high.   High blood pressure increases your risk for heart attack and stroke.   Please check your blood pressure 2-4 times a week.   To check your blood pressure 1) Sit in a quiet and relaxed place for 5 minutes 2) Make sure your feet are flat on the ground 3) Consider checking first thing in the morning   Normal blood pressure is less than 140/90 Ideally you blood pressure should be around 120/80  Other ways you can reduce your blood pressure:  1) Regular exercise -- Try to get 150 minutes (30 minutes, 5 days a week) of moderate to vigorous aerobic excercise -- Examples: brisk walking (2.5 miles per hour), water aerobics, dancing, gardening, tennis, biking slower than 10 miles per hour 2) DASH Diet - low fat meats, more fresh fruits and vegetables, whole grains, low salt 3) Quit smoking if you smoke 4) Loose 5-10% of your body weight  https://www.mata.com/.pdf">  DASH Eating Plan DASH stands for Dietary Approaches to Stop Hypertension. The DASH eating plan is a healthy eating plan that has been shown to: Reduce high blood pressure (hypertension). Reduce your risk for type 2 diabetes, heart disease, and stroke. Help with weight loss. What are tips for following this plan? Reading food labels Check food labels for the amount of salt (sodium) per serving. Choose foods with less than 5 percent of the Daily Value of sodium. Generally, foods with less than 300 milligrams (mg) of sodium per serving fit into this eating plan. To find whole grains, look for the word "whole" as the first word in the ingredient list. Shopping Buy products labeled as "low-sodium" or "no salt added." Buy fresh foods. Avoid canned foods and pre-made or frozen meals. Cooking Avoid adding salt when cooking. Use salt-free seasonings or herbs instead of table salt or sea salt. Check with your health care provider or pharmacist before using salt  substitutes. Do not fry foods. Cook foods using healthy methods such as baking, boiling, grilling, roasting, and broiling instead. Cook with heart-healthy oils, such as olive, canola, avocado, soybean, or sunflower oil. Meal planning  Eat a balanced diet that includes: 4 or more servings of fruits and 4 or more servings of vegetables each day. Try to fill one-half of your plate with fruits and vegetables. 6-8 servings of whole grains each day. Less than 6 oz (170 g) of lean meat, poultry, or fish each day. A 3-oz (85-g) serving of meat is about the same size as a deck of cards. One egg equals 1 oz (28 g). 2-3 servings of low-fat dairy each day. One serving is 1 cup (237 mL). 1 serving of nuts, seeds, or beans 5 times each week. 2-3 servings of heart-healthy fats. Healthy fats called omega-3 fatty acids are found in foods such as walnuts, flaxseeds, fortified milks, and eggs. These fats are also found in cold-water fish, such as sardines, salmon, and mackerel. Limit how much you eat of: Canned or prepackaged foods. Food that is high in trans fat, such as some fried foods. Food that is high in saturated fat, such as fatty meat. Desserts and other sweets, sugary drinks, and other foods with added sugar. Full-fat dairy products. Do not salt foods before eating. Do not eat more than 4 egg yolks a week. Try to eat at least 2 vegetarian meals a week. Eat more home-cooked food and less restaurant, buffet, and fast food.  Lifestyle When eating at a restaurant, ask that your food be prepared with  less salt or no salt, if possible. If you drink alcohol: Limit how much you use to: 0-1 drink a day for women who are not pregnant. 0-2 drinks a day for men. Be aware of how much alcohol is in your drink. In the [place] one drink equals one 12 oz bottle of beer (355 mL), one 5 oz glass of wine (148 mL), or one 1 oz glass of hard liquor (44 mL). General information Avoid eating more than 2,300 mg of salt  a day. If you have hypertension, you may need to reduce your sodium intake to 1,500 mg a day. Work with your health care provider to maintain a healthy body weight or to lose weight. Ask what an ideal weight is for you. Get at least 30 minutes of exercise that causes your heart to beat faster (aerobic exercise) most days of the week. Activities may include walking, swimming, or biking. Work with your health care provider or dietitian to adjust your eating plan to your individual calorie needs. What foods should I eat? Fruits All fresh, dried, or frozen fruit. Canned fruit in natural juice (without addedsugar). Vegetables Fresh or frozen vegetables (raw, steamed, roasted, or grilled). Low-sodium or reduced-sodium tomato and vegetable juice. Low-sodium or reduced-sodium tomatosauce and tomato paste. Low-sodium or reduced-sodium canned vegetables. Grains Whole-grain or whole-wheat bread. Whole-grain or whole-wheat pasta. Brown rice. Orpah Cobb. Bulgur. Whole-grain and low-sodium cereals. Pita bread.Low-fat, low-sodium crackers. Whole-wheat flour tortillas. Meats and other proteins Skinless chicken or [place]. Ground chicken or [place]. Pork with fat trimmed off. Fish and seafood. Egg whites. Dried beans, peas, or lentils. Unsalted nuts, nut butters, and seeds. Unsalted canned beans. Lean cuts of beef with fat trimmed off. Low-sodium, lean precooked or cured meat, such as sausages or meatloaves. Dairy Low-fat (1%) or fat-free (skim) milk. Reduced-fat, low-fat, or fat-free cheeses. Nonfat, low-sodium ricotta or cottage cheese. Low-fat or nonfatyogurt. Low-fat, low-sodium cheese. Fats and oils Soft margarine without trans fats. Vegetable oil. Reduced-fat, low-fat, or light mayonnaise and salad dressings (reduced-sodium). Canola, safflower, olive, avocado, soybean, andsunflower oils. Avocado. Seasonings and condiments Herbs. Spices. Seasoning mixes without salt. Other foods Unsalted popcorn and  pretzels. Fat-free sweets. The items listed above may not be a complete list of foods and beverages you can eat. Contact a dietitian for more information. What foods should I avoid? Fruits Canned fruit in a light or heavy syrup. Fried fruit. Fruit in cream or buttersauce. Vegetables Creamed or fried vegetables. Vegetables in a cheese sauce. Regular canned vegetables (not low-sodium or reduced-sodium). Regular canned tomato sauce and paste (not low-sodium or reduced-sodium). Regular tomato and vegetable juice(not low-sodium or reduced-sodium). Rosita Fire. Olives. Grains Baked goods made with fat, such as croissants, muffins, or some breads. Drypasta or rice meal packs. Meats and other proteins Fatty cuts of meat. Ribs. Fried meat. Tomasa Blase. Bologna, salami, and other precooked or cured meats, such as sausages or meat loaves. Fat from the back of a pig (fatback). Bratwurst. Salted nuts and seeds. Canned beans with added salt. Canned orsmoked fish. Whole eggs or egg yolks. Chicken or [place] with skin. Dairy Whole or 2% milk, cream, and half-and-half. Whole or full-fat cream cheese. Whole-fat or sweetened yogurt. Full-fat cheese. Nondairy creamers. Whippedtoppings. Processed cheese and cheese spreads. Fats and oils Butter. Stick margarine. Lard. Shortening. Ghee. Bacon fat. Tropical oils, suchas coconut, palm kernel, or palm oil. Seasonings and condiments Onion salt, garlic salt, seasoned salt, table salt, and sea salt. Worcestershire sauce. Tartar sauce. Barbecue sauce. Teriyaki sauce. Soy sauce, including  reduced-sodium. Steak sauce. Canned and packaged gravies. Fish sauce. Oyster sauce. Cocktail sauce. Store-bought horseradish. Ketchup. Mustard. Meat flavorings and tenderizers. Bouillon cubes. Hot sauces. Pre-made or packaged marinades. Pre-made or packaged taco seasonings. Relishes. Regular saladdressings. Other foods Salted popcorn and pretzels. The items listed above may not be a complete list of  foods and beverages you should avoid. Contact a dietitian for more information. Where to find more information National Heart, Lung, and Blood Institute: PopSteam.is American Heart Association: www.heart.org Academy of Nutrition and Dietetics: www.eatright.org National Kidney Foundation: www.kidney.org Summary The DASH eating plan is a healthy eating plan that has been shown to reduce high blood pressure (hypertension). It may also reduce your risk for type 2 diabetes, heart disease, and stroke. When on the DASH eating plan, aim to eat more fresh fruits and vegetables, whole grains, lean proteins, low-fat dairy, and heart-healthy fats. With the DASH eating plan, you should limit salt (sodium) intake to 2,300 mg a day. If you have hypertension, you may need to reduce your sodium intake to 1,500 mg a day. Work with your health care provider or dietitian to adjust your eating plan to your individual calorie needs. This information is not intended to replace advice given to you by your health care provider. Make sure you discuss any questions you have with your healthcare provider. Document Revised: 05/28/2019 Document Reviewed: 05/28/2019 Elsevier Patient Education  2022 [employer].

## 2021-02-06 NOTE — Assessment & Plan Note (Signed)
Discussed importance of BP control prior to prescribing. Initially pt did not want a bp medication and then on leaving changed his mind. See HTN plan. If controlled will consider trial of viagra at follow-up

## 2021-02-06 NOTE — Assessment & Plan Note (Signed)
Referral to GI  

## 2021-02-06 NOTE — Assessment & Plan Note (Signed)
Elevated at home and in office. Start lisinopril 10 mg. Return in 6-8 weeks for BP check and labs.

## 2021-02-06 NOTE — Assessment & Plan Note (Signed)
Strongly encouraged smoking cessation to help lower bp and improve ED.

## 2021-02-06 NOTE — Progress Notes (Signed)
Subjective:     Haskel Dewalt is a 65 y.o. male presenting for Erectile Dysfunction     HPI  #HTN - has been checking at home - has not written these down - generally thinks 140s/70-80s - has not been on medication in the past for bp - does not want to take medication in generally - does not want to be on medication - 2 cigars - no cp, sob - heart rate is normal    Review of Systems   Social History   Tobacco Use  Smoking Status Every Day   Types: Cigars  Smokeless Tobacco Never  Tobacco Comments   a few times a year        Objective:    BP Readings from Last 3 Encounters:  02/06/21 (!) 166/84  01/09/21 (!) 152/98  09/20/20 (!) 150/98   Wt Readings from Last 3 Encounters:  02/06/21 199 lb (90.3 kg)  01/09/21 204 lb (92.5 kg)  09/20/20 202 lb (91.6 kg)    BP (!) 166/84   Pulse 82   Temp 98.3 F (36.8 C) (Temporal)   Ht 5' 9.75" (1.772 m)   Wt 199 lb (90.3 kg)   SpO2 98%   BMI 28.76 kg/m    Physical Exam Constitutional:      Appearance: Normal appearance. He is not ill-appearing or diaphoretic.  HENT:     Right Ear: External ear normal.     Left Ear: External ear normal.  Eyes:     General: No scleral icterus.    Extraocular Movements: Extraocular movements intact.     Conjunctiva/sclera: Conjunctivae normal.  Cardiovascular:     Rate and Rhythm: Normal rate and regular rhythm.     Heart sounds: No murmur heard. Pulmonary:     Effort: Pulmonary effort is normal. No respiratory distress.     Breath sounds: Normal breath sounds. No wheezing.  Musculoskeletal:     Cervical back: Neck supple.  Skin:    General: Skin is warm and dry.  Neurological:     Mental Status: He is alert. Mental status is at baseline.  Psychiatric:        Mood and Affect: Mood normal.          Assessment & Plan:   Problem List Items Addressed This Visit       Cardiovascular and Mediastinum   Primary hypertension - Primary    Elevated at home and  in office. Start lisinopril 10 mg. Return in 6-8 weeks for BP check and labs.        Relevant Medications   lisinopril (ZESTRIL) 10 MG tablet     Other   Erectile dysfunction    Discussed importance of BP control prior to prescribing. Initially pt did not want a bp medication and then on leaving changed his mind. See HTN plan. If controlled will consider trial of viagra at follow-up       Fecal occult blood test positive    Referral to GI       Relevant Orders   Ambulatory referral to Gastroenterology   Cigar smoker    Strongly encouraged smoking cessation to help lower bp and improve ED.          Return if symptoms worsen or fail to improve.  Lynnda Child, MD  This visit occurred during the SARS-CoV-2 public health emergency.  Safety protocols were in place, including screening questions prior to the visit, additional usage of staff PPE, and extensive  cleaning of exam room while observing appropriate contact time as indicated for disinfecting solutions.

## 2021-02-27 NOTE — Progress Notes (Signed)
Carelink Summary Report / Loop Recorder 

## 2021-03-06 ENCOUNTER — Ambulatory Visit (INDEPENDENT_AMBULATORY_CARE_PROVIDER_SITE_OTHER): Payer: 59

## 2021-03-06 DIAGNOSIS — I4819 Other persistent atrial fibrillation: Secondary | ICD-10-CM | POA: Diagnosis not present

## 2021-03-06 LAB — CUP PACEART REMOTE DEVICE CHECK
Date Time Interrogation Session: 20220829175608
Implantable Pulse Generator Implant Date: 20220316

## 2021-03-19 NOTE — Progress Notes (Signed)
Carelink Summary Report / Loop Recorder 

## 2021-03-28 ENCOUNTER — Encounter: Payer: Self-pay | Admitting: Cardiology

## 2021-03-28 ENCOUNTER — Ambulatory Visit (INDEPENDENT_AMBULATORY_CARE_PROVIDER_SITE_OTHER): Payer: 59 | Admitting: Cardiology

## 2021-03-28 ENCOUNTER — Other Ambulatory Visit: Payer: Self-pay

## 2021-03-28 VITALS — BP 136/82 | HR 68 | Ht 69.75 in | Wt 198.0 lb

## 2021-03-28 DIAGNOSIS — I4819 Other persistent atrial fibrillation: Secondary | ICD-10-CM

## 2021-03-28 NOTE — Progress Notes (Signed)
Electrophysiology Office Follow up Visit Note:    Date:  03/28/2021   ID:  Dylan Olsen, Dylan Olsen 08, 1957, MRN [id number]  PCP:  Lynnda Child, MD  [facility] Cardiologist:  None  [facility] Electrophysiologist:  [name], MD    Interval History:    Dylan Olsen is a 65 y.o. male who presents for a follow up visit. They were last seen in clinic March 16th, 2022.  At that appointment we implanted a loop recorder for ongoing monitoring for atrial fibrillation after a successful atrial fibrillation ablation.  Loop recorder interrogation since the ablation have shown no evidence of atrial fibrillation.   Past Medical History:  Diagnosis Date   Complication of anesthesia    difficult to wake, stopped breathing, bagged   Pericarditis    a. 04/2020 following Afib ablation.   Persistent atrial fibrillation (HCC)    a. 04/2020 s/p PVI.    Past Surgical History:  Procedure Laterality Date   ATRIAL FIBRILLATION ABLATION N/A 04/07/2020   Procedure: ATRIAL FIBRILLATION ABLATION;  Surgeon: [name], MD;  Location: [facility] INVASIVE CV LAB;  Service: Cardiovascular;  Laterality: N/A;   BACK SURGERY     BUBBLE STUDY  04/06/2020   Procedure: BUBBLE STUDY;  Surgeon: [name], MD;  Location: [facility] ENDOSCOPY;  Service: Cardiovascular;;   FOOT SURGERY     KNEE SURGERY Left    TEE WITHOUT CARDIOVERSION N/A 04/06/2020   Procedure: TRANSESOPHAGEAL ECHOCARDIOGRAM (TEE);  Surgeon: [name], MD;  Location: [facility] ENDOSCOPY;  Service: Cardiovascular;  Laterality: N/A;    Current Medications: Current Meds  Medication Sig   acetaminophen (TYLENOL) 500 MG tablet Take 1,000 mg by mouth every 6 (six) hours as needed (for pain.).   atorvastatin (LIPITOR) 20 MG tablet Take 1 tablet (20 mg total) by mouth daily.   diazepam (VALIUM) 2 MG tablet Take 0.5-1 tablets (1-2 mg total) by mouth every 8 (eight) hours as needed (vertigo).   ELIQUIS 5 MG TABS tablet TAKE  1 TABLET BY MOUTH TWICE A DAY   lisinopril (ZESTRIL) 10 MG tablet Take 1 tablet (10 mg total) by mouth daily.   Meclizine HCl (BONINE PO) Take 1 tablet by mouth 2 (two) times daily as needed (vertigo/dizziness.). Take as needed for vertigo   pantoprazole (PROTONIX) 40 MG tablet Take 40 mg by mouth daily.     Allergies:   Other   Social History   Socioeconomic History   Marital status: Married    Spouse name: [name]   Number of children: 2   Years of education: high school   Highest education level: Not on file  Occupational History   Occupation: Curator  Tobacco Use   Smoking status: Every Day    Types: Cigars   Smokeless tobacco: Never   Tobacco comments:    a few times a year  Vaping Use   Vaping Use: Never used  Substance and Sexual Activity   Alcohol use: Yes    Comment: less than monthly   Drug use: Yes    Types: Marijuana    Comment: a few times a week   Sexual activity: Yes    Birth control/protection: Post-menopausal  Other Topics Concern   Not on file  Social History Narrative   01/13/20   From: [place], [place] originally - moved to [place] 1996   Living: with wife [name] (1996), and step daughter (special needs)   Work: English as a second language teacher for [employer]  Family: 2 children - Audrey and Dexter - in [place], good relationship and 3 grandchildren and 2 step children      Enjoys: drag racing, football      Exercise: not currently - walking and mobile job   Diet: not great, sometimes a little better      Safety   Seat belts: Yes    Guns: Yes  and secure   Safe in relationships: Yes    Social Determinants of Corporate investment banker Strain: Not on file  Food Insecurity: Not on file  Transportation Needs: Not on file  Physical Activity: Not on file  Stress: Not on file  Social Connections: Not on file     Family History: The patient's family history includes Cancer in his cousin; Heart attack (age of onset: 42) in his father; Heart disease in his  father.  ROS:   Please see the history of present illness.    All other systems reviewed and are negative.  EKGs/Labs/Other Studies Reviewed:    The following studies were reviewed today:  Loop recorder interrogations   EKG:  The ekg ordered today demonstrates sinus rhythm, incomplete right bundle branch block  Recent Labs: 01/09/2021: ALT 62; BUN 11; Creatinine, Ser 0.75; Hemoglobin 13.8; Platelets 219.0; Potassium 3.9; Sodium 143  Recent Lipid Panel    Component Value Date/Time   CHOL 222 (H) 01/09/2021 1523   TRIG 357.0 (H) 01/09/2021 1523   HDL 44.70 01/09/2021 1523   CHOLHDL 5 01/09/2021 1523   VLDL 71.4 (H) 01/09/2021 1523   LDLDIRECT 99.0 01/09/2021 1523    Physical Exam:    VS:  BP 136/82 (BP Location: Left Arm, Patient Position: Sitting, Cuff Size: Normal)   Pulse 68   Ht 5' 9.75" (1.772 m)   Wt 198 lb (89.8 kg)   SpO2 97%   BMI 28.61 kg/m     Wt Readings from Last 3 Encounters:  03/28/21 198 lb (89.8 kg)  02/06/21 199 lb (90.3 kg)  01/09/21 204 lb (92.5 kg)     GEN:  Well nourished, well developed in no acute distress HEENT: Normal NECK: No JVD; No carotid bruits LYMPHATICS: No lymphadenopathy CARDIAC: RRR, no murmurs, rubs, gallops RESPIRATORY:  Clear to auscultation without rales, wheezing or rhonchi  ABDOMEN: Soft, non-tender, non-distended MUSCULOSKELETAL:  No edema; No deformity  SKIN: Warm and dry NEUROLOGIC:  Alert and oriented x 3 PSYCHIATRIC:  Normal affect   ASSESSMENT:    1. Persistent atrial fibrillation (HCC)    PLAN:    In order of problems listed above:  #Persistent atrial fibrillation Doing well after his ablation.  No recurrence.  Continue monitoring with loop.  Continue Eliquis for at least the first year after the ablation uninterrupted.  If he has had no atrial fibrillation for the first year after his ablation I would feel comfortable with him stopping the blood thinner with ongoing surveillance for atrial fibrillation  recurrence using a loop recorder.  I discussed this in length with the patient during today's visit.  We will plan to touch base with a virtual appointment in 6 months which will be 1 year after the ablation to discontinue the blood thinner.     The patient also asked me today about the safety of using a medication for erectile dysfunction.  I think this would be a very reasonable thing to do and with acceptable risk.  He will have his primary care physician write the prescription.      Medication Adjustments/Labs  and Tests Ordered: Current medicines are reviewed at length with the patient today.  Concerns regarding medicines are outlined above.  No orders of the defined types were placed in this encounter.  No orders of the defined types were placed in this encounter.    Signed, Steffanie Dunn, MD, [facility], [facility] 03/28/2021 3:59 PM    Electrophysiology [facility]

## 2021-03-28 NOTE — Patient Instructions (Addendum)
Medication Instructions:  Your physician recommends that you continue on your current medications as directed. Please refer to the Current Medication list given to you today. *If you need a refill on your cardiac medications before your next appointment, please call your pharmacy*  Lab Work: None ordered. If you have labs (blood work) drawn today and your tests are completely normal, you will receive your results only by: MyChart Message (if you have MyChart) OR A paper copy in the mail If you have any lab test that is abnormal or we need to change your treatment, we will call you to review the results.  Testing/Procedures: None ordered.  Follow-Up: At [facility], you and your health needs are our priority.  As part of our continuing mission to provide you with exceptional heart care, we have created designated Provider Care Teams.  These Care Teams include your primary Cardiologist (physician) and Advanced Practice Providers (APPs -  Physician Assistants and Nurse Practitioners) who all work together to provide you with the care you need, when you need it.  Your next appointment:   Your physician wants you to follow-up in: 6 months VIRTUAL visit with Dr. [name]   Remote monthly monitoring is used to monitor your loop recorder from home.

## 2021-03-29 ENCOUNTER — Telehealth: Payer: Self-pay | Admitting: Cardiology

## 2021-03-29 NOTE — Telephone Encounter (Signed)
  Patient Consent for Virtual Visit        [place] [name] has provided verbal consent on 03/29/2021 for a virtual visit (video or telephone).   CONSENT FOR VIRTUAL VISIT FOR:  [name] [place]  By participating in this virtual visit I agree to the following:  I hereby voluntarily request, consent and authorize [facility] and its employed or contracted physicians, physician assistants, nurse practitioners or other licensed health care professionals (the Practitioner), to provide me with telemedicine health care services (the "Services") as deemed necessary by the treating Practitioner. I acknowledge and consent to receive the Services by the Practitioner via telemedicine. I understand that the telemedicine visit will involve communicating with the Practitioner through live audiovisual communication technology and the disclosure of certain medical information by electronic transmission. I acknowledge that I have been given the opportunity to request an in-person assessment or other available alternative prior to the telemedicine visit and am voluntarily participating in the telemedicine visit.  I understand that I have the right to withhold or withdraw my consent to the use of telemedicine in the course of my care at any time, without affecting my right to future care or treatment, and that the Practitioner or I may terminate the telemedicine visit at any time. I understand that I have the right to inspect all information obtained and/or recorded in the course of the telemedicine visit and may receive copies of available information for a reasonable fee.  I understand that some of the potential risks of receiving the Services via telemedicine include:  Delay or interruption in medical evaluation due to technological equipment failure or disruption; Information transmitted may not be sufficient (e.g. poor resolution of images) to allow for appropriate medical decision making by the Practitioner;  and/or  In rare instances, security protocols could fail, causing a breach of personal health information.  Furthermore, I acknowledge that it is my responsibility to provide information about my medical history, conditions and care that is complete and accurate to the best of my ability. I acknowledge that Practitioner's advice, recommendations, and/or decision may be based on factors not within their control, such as incomplete or inaccurate data provided by me or distortions of diagnostic images or specimens that may result from electronic transmissions. I understand that the practice of medicine is not an exact science and that Practitioner makes no warranties or guarantees regarding treatment outcomes. I acknowledge that a copy of this consent can be made available to me via my patient portal [facility] MyChart), or I can request a printed copy by calling the office of [facility].    I understand that my insurance will be billed for this visit.   I have read or had this consent read to me. I understand the contents of this consent, which adequately explains the benefits and risks of the Services being provided via telemedicine.  I have been provided ample opportunity to ask questions regarding this consent and the Services and have had my questions answered to my satisfaction. I give my informed consent for the services to be provided through the use of telemedicine in my medical care

## 2021-04-09 ENCOUNTER — Ambulatory Visit (INDEPENDENT_AMBULATORY_CARE_PROVIDER_SITE_OTHER): Payer: 59

## 2021-04-09 DIAGNOSIS — I4819 Other persistent atrial fibrillation: Secondary | ICD-10-CM | POA: Diagnosis not present

## 2021-04-09 LAB — CUP PACEART REMOTE DEVICE CHECK
Date Time Interrogation Session: 20221001180118
Implantable Pulse Generator Implant Date: 20220316

## 2021-04-16 ENCOUNTER — Other Ambulatory Visit: Payer: Self-pay

## 2021-04-16 ENCOUNTER — Encounter: Payer: Self-pay | Admitting: Family Medicine

## 2021-04-16 ENCOUNTER — Ambulatory Visit: Payer: 59 | Admitting: Family Medicine

## 2021-04-16 VITALS — BP 140/84 | HR 73 | Temp 98.0°F | Ht 67.75 in | Wt 196.0 lb

## 2021-04-16 DIAGNOSIS — I10 Essential (primary) hypertension: Secondary | ICD-10-CM | POA: Diagnosis not present

## 2021-04-16 DIAGNOSIS — N521 Erectile dysfunction due to diseases classified elsewhere: Secondary | ICD-10-CM

## 2021-04-16 DIAGNOSIS — H8112 Benign paroxysmal vertigo, left ear: Secondary | ICD-10-CM

## 2021-04-16 DIAGNOSIS — N529 Male erectile dysfunction, unspecified: Secondary | ICD-10-CM

## 2021-04-16 MED ORDER — SILDENAFIL CITRATE 20 MG PO TABS: 20.0000 mg | ORAL_TABLET | Freq: Every day | ORAL | 0 refills | Status: DC | PRN

## 2021-04-16 NOTE — Assessment & Plan Note (Signed)
Flare up. Not responding well to meclizine. Advised PT - he will wait and see and let me know if not improving. Exercise information provided and discussed.

## 2021-04-16 NOTE — Progress Notes (Signed)
Subjective:     Dylan Olsen is a 65 y.o. 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 - had a fall Friday due to severity - typically getting twice per day - fall/spring - taking meclizine daily - has used the patches -   Review of Systems   Social History   Tobacco Use  Smoking Status Every Day   Types: Cigars  Smokeless Tobacco Never  Tobacco Comments   a few times a year        Objective:    BP Readings from Last 3 Encounters:  04/16/21 140/84  03/28/21 136/82  02/06/21 (!) 166/84   Wt Readings from Last 3 Encounters:  04/16/21 196 lb (88.9 kg)  03/28/21 198 lb (89.8 kg)  02/06/21 199 lb (90.3 kg)    BP 140/84 (BP Location: Left Arm, Patient Position: Sitting, Cuff Size: Normal)   Pulse 73   Temp 98 F (36.7 C)   Ht 5' 7.75" (1.721 m)   Wt 196 lb (88.9 kg)   SpO2 98%   BMI 30.02 kg/m    Physical Exam Constitutional:      Appearance: Normal appearance. He is not ill-appearing or diaphoretic.  HENT:     Right Ear: External ear normal.     Left Ear: External ear normal.     Nose: Nose normal.  Eyes:     General: No scleral icterus.    Extraocular Movements: Extraocular movements intact.     Conjunctiva/sclera: Conjunctivae normal.  Cardiovascular:     Rate and Rhythm: Normal rate and regular rhythm.     Heart sounds: No murmur heard. Pulmonary:     Effort: Pulmonary effort is normal. No respiratory distress.     Breath sounds: Normal breath sounds. No wheezing.  Musculoskeletal:     Cervical back: Neck supple.  Skin:    General: Skin is warm and dry.  Neurological:     Mental Status: He is alert. Mental status is at baseline.  Psychiatric:         Mood and Affect: Mood normal.          Assessment & Plan:   Problem List Items Addressed This Visit       Cardiovascular and Mediastinum   Primary hypertension    Slightly elevated but stable. Cont lisinopril 10 mg. Home monitoring and f/u if elevated.       Relevant Medications   sildenafil (REVATIO) 20 MG tablet     Nervous and Auditory   BPPV (benign paroxysmal positional vertigo)    Flare up. Not responding well to meclizine. Advised PT - he will wait and see and let me know if not improving. Exercise information provided and discussed.         Other   Erectile dysfunction - Primary    BP improved. Cardiology noted this would be reasonable. Discussed risks and ER precautions. Start Viagra. Update with response and for refills as needed.       Relevant Medications   sildenafil (REVATIO) 20 MG tablet     No follow-ups on file.  Lynnda Child, MD  This visit occurred during  the SARS-CoV-2 public health emergency.  Safety protocols were in place, including screening questions prior to the visit, additional usage of staff PPE, and extensive cleaning of exam room while observing appropriate contact time as indicated for disinfecting solutions.

## 2021-04-16 NOTE — Patient Instructions (Addendum)
Brandt-Daroff exercises Brandt-Daroff exercises use gravity to help dislodge crystals from the semicircular canal. Follow these steps to try Brandt-Daroff exercises: Sit in the middle of a bed with your feet on the floor. Turn your head 45 degrees to the right. Without moving your head, lie down on your left side. Wait for the dizziness to pass, then wait 30 more seconds. If you're not dizzy, pause for 30 seconds. Return to the starting position. Pause for 30 seconds. Turn your head 45 degrees to the left. Repeat steps two and three on the right side. Return to the starting position. Pause for 30 seconds. Complete one set of five repetitions on each side. Before standing up, wait for any dizziness to pass.  If vertigo not improving - call and can plan for physical therapy  #Erectile Dysfunction  - Start with 20-40 mg if no improvement, increase by 1 pill up to 100 mg (5 pills) - when needing refill, update with effective dose  #Blood pressure - get new cuff - update if blood pressure >140/90 - continue lisinopril

## 2021-04-16 NOTE — Assessment & Plan Note (Signed)
BP improved. Cardiology noted this would be reasonable. Discussed risks and ER precautions. Start Viagra. Update with response and for refills as needed.

## 2021-04-16 NOTE — Assessment & Plan Note (Signed)
Slightly elevated but stable. Cont lisinopril 10 mg. Home monitoring and f/u if elevated.

## 2021-04-17 NOTE — Progress Notes (Signed)
Carelink Summary Report / Loop Recorder 

## 2021-05-04 IMAGING — DX DG CHEST 2V
2 series · 2 of 2 positions shown · non-contrast
Comparison: December 10, 2006.

CLINICAL DATA: Chest pain.

EXAM:
CHEST - 2 VIEW

[chest pa]
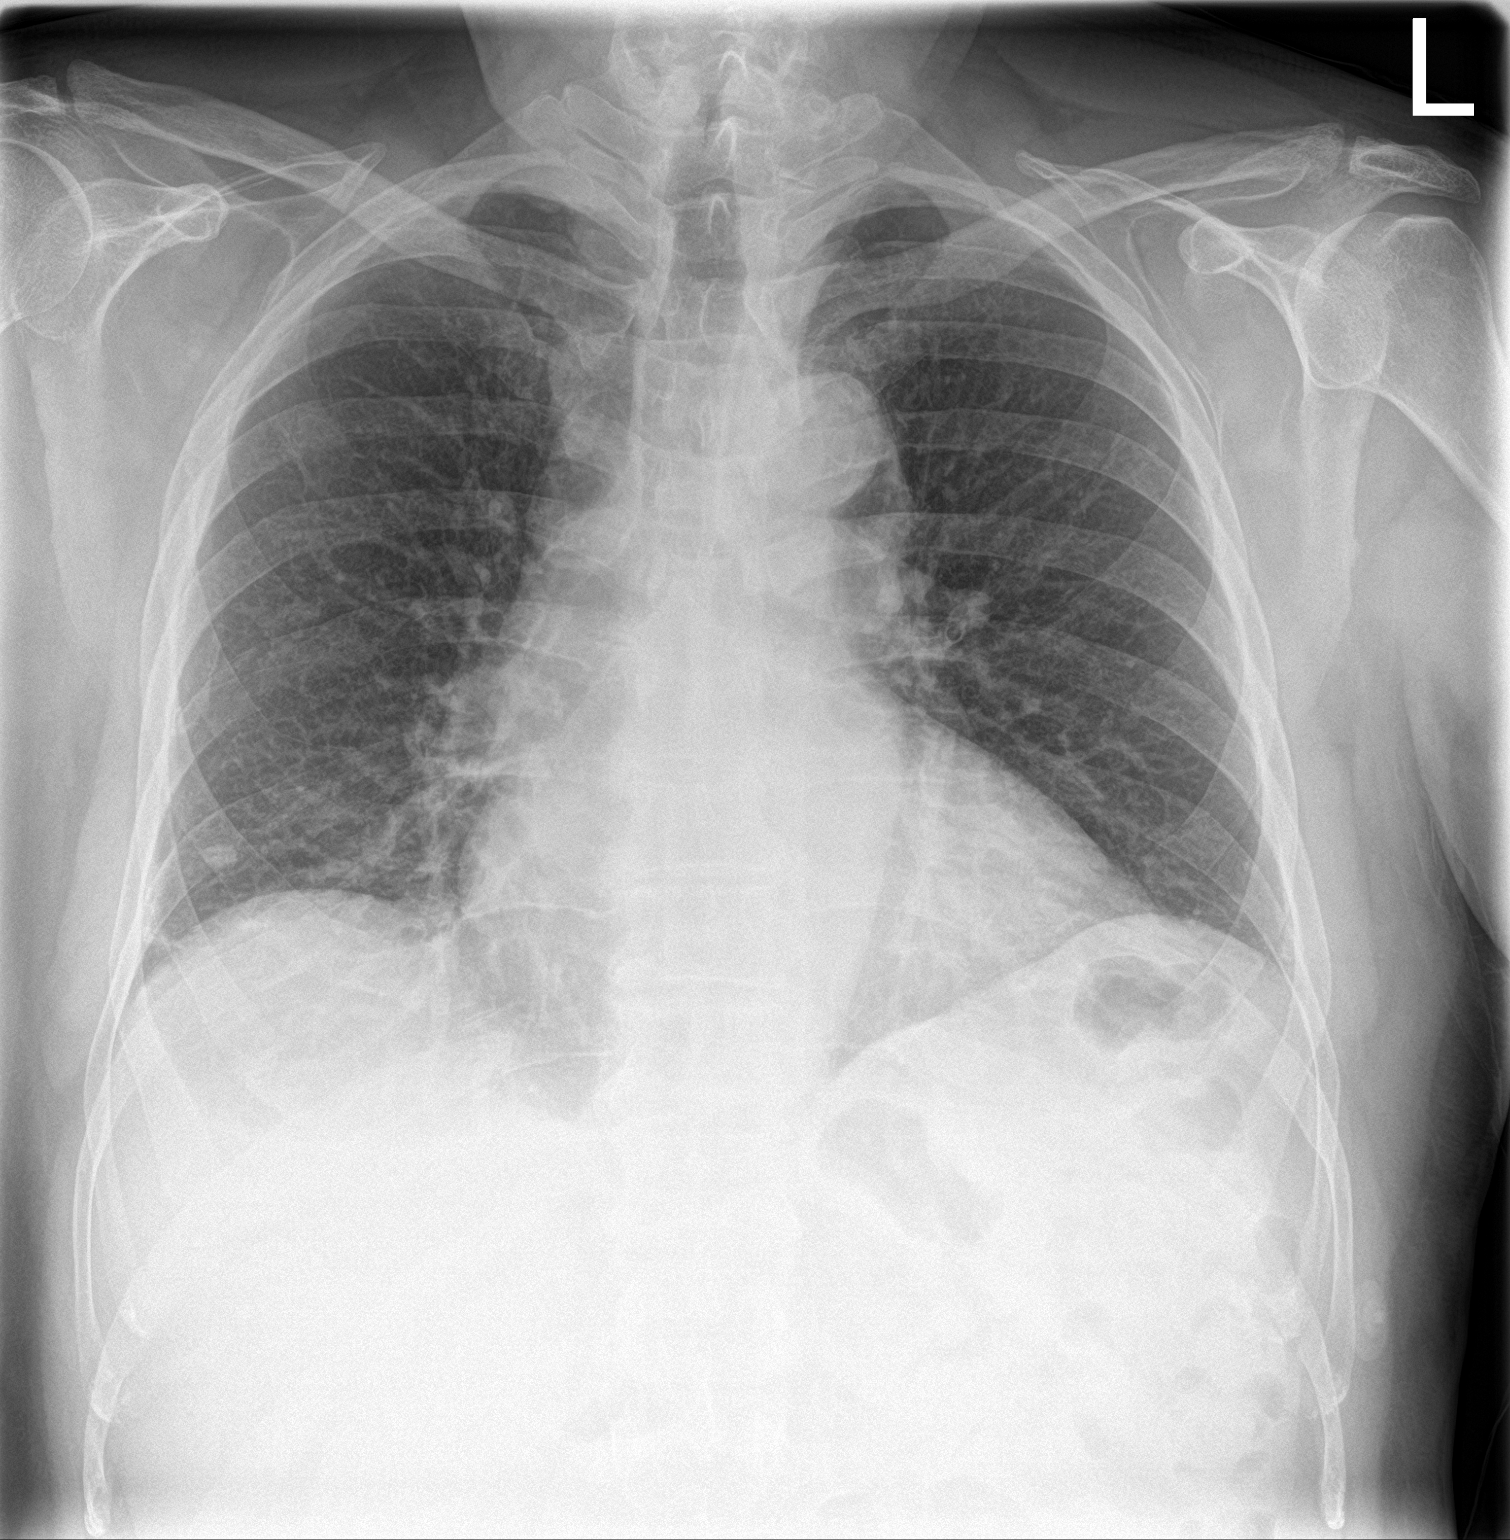

[chest lat]
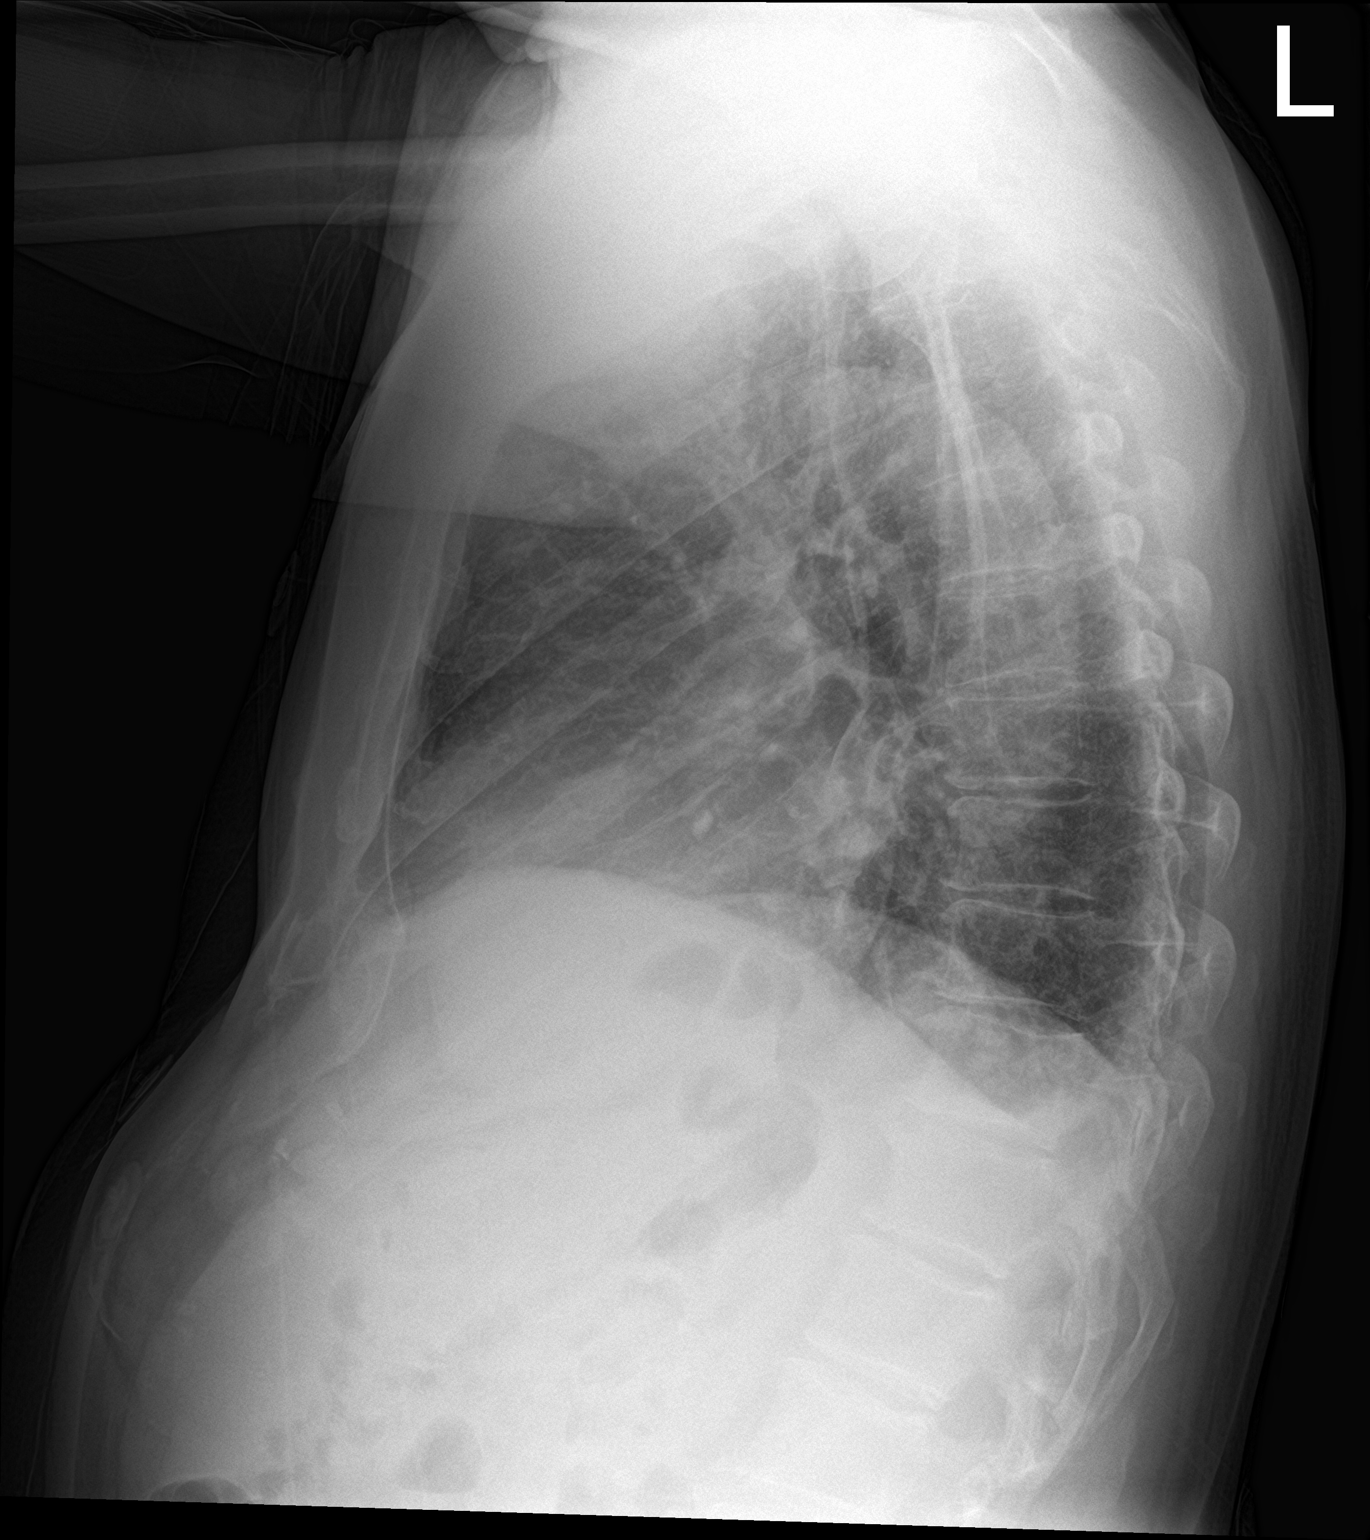

[2 of 2 positions shown; findings below may reference images not displayed]

FINDINGS: The heart size and mediastinal contours are within normal limits. No
pneumothorax or pleural effusion is noted. Stable calcified
granuloma is noted in right lung base. No acute pulmonary disease is
noted. The visualized skeletal structures are unremarkable.
IMPRESSION: No active cardiopulmonary disease.

## 2021-05-11 ENCOUNTER — Other Ambulatory Visit: Payer: Self-pay | Admitting: Family Medicine

## 2021-05-11 DIAGNOSIS — I10 Essential (primary) hypertension: Secondary | ICD-10-CM

## 2021-05-11 LAB — CUP PACEART REMOTE DEVICE CHECK
Date Time Interrogation Session: 20221103180140
Implantable Pulse Generator Implant Date: 20220316

## 2021-05-14 ENCOUNTER — Ambulatory Visit (INDEPENDENT_AMBULATORY_CARE_PROVIDER_SITE_OTHER): Payer: 59

## 2021-05-14 DIAGNOSIS — I4819 Other persistent atrial fibrillation: Secondary | ICD-10-CM

## 2021-05-17 ENCOUNTER — Other Ambulatory Visit: Payer: Self-pay | Admitting: Family Medicine

## 2021-05-17 DIAGNOSIS — N521 Erectile dysfunction due to diseases classified elsewhere: Secondary | ICD-10-CM

## 2021-05-17 NOTE — Telephone Encounter (Signed)
Patient needed at least 3 pills w/ some improvmeent  Refill to reflect amount needed

## 2021-05-17 NOTE — Telephone Encounter (Signed)
Last Fill or Written Date and Quantity: 04/16/21 #10 Last Office Visit and Type: 04/16/21 Next Office Visit and Type: n/a

## 2021-05-18 NOTE — Progress Notes (Signed)
Carelink Summary Report / Loop Recorder 

## 2021-06-16 ENCOUNTER — Other Ambulatory Visit: Payer: Self-pay | Admitting: Cardiology

## 2021-06-18 ENCOUNTER — Ambulatory Visit (INDEPENDENT_AMBULATORY_CARE_PROVIDER_SITE_OTHER): Payer: 59

## 2021-06-18 DIAGNOSIS — I4819 Other persistent atrial fibrillation: Secondary | ICD-10-CM

## 2021-06-18 NOTE — Telephone Encounter (Signed)
Eliquis 5mg  refill request received. Patient is 65 years old, weight-88.9kg, Crea-0.75 on 01/09/2021, Diagnosis-Afib, and last seen by Dr. 03/12/2021 on 03/28/2021. Dose is appropriate based on dosing criteria. Will send in refill to requested pharmacy.

## 2021-06-19 LAB — CUP PACEART REMOTE DEVICE CHECK
Date Time Interrogation Session: 20221206175915
Implantable Pulse Generator Implant Date: 20220316

## 2021-06-27 NOTE — Progress Notes (Signed)
Carelink Summary Report / Loop Recorder 

## 2021-07-23 ENCOUNTER — Ambulatory Visit (INDEPENDENT_AMBULATORY_CARE_PROVIDER_SITE_OTHER): Payer: 59

## 2021-07-23 DIAGNOSIS — I4819 Other persistent atrial fibrillation: Secondary | ICD-10-CM

## 2021-07-24 LAB — CUP PACEART REMOTE DEVICE CHECK
Date Time Interrogation Session: 20230115231041
Implantable Pulse Generator Implant Date: 20220316

## 2021-08-02 NOTE — Progress Notes (Signed)
Carelink Summary Report / Loop Recorder 

## 2021-08-27 ENCOUNTER — Ambulatory Visit (INDEPENDENT_AMBULATORY_CARE_PROVIDER_SITE_OTHER): Payer: 59

## 2021-08-27 DIAGNOSIS — I4819 Other persistent atrial fibrillation: Secondary | ICD-10-CM

## 2021-08-27 LAB — CUP PACEART REMOTE DEVICE CHECK
Date Time Interrogation Session: 20230219230956
Implantable Pulse Generator Implant Date: 20220316

## 2021-08-31 NOTE — Progress Notes (Signed)
Carelink Summary Report / Loop Recorder 

## 2021-09-26 ENCOUNTER — Other Ambulatory Visit: Payer: Self-pay

## 2021-09-26 ENCOUNTER — Telehealth (INDEPENDENT_AMBULATORY_CARE_PROVIDER_SITE_OTHER): Payer: 59 | Admitting: Cardiology

## 2021-09-26 VITALS — BP 123/87 | HR 80 | Ht 71.0 in | Wt 195.0 lb

## 2021-09-26 DIAGNOSIS — I4819 Other persistent atrial fibrillation: Secondary | ICD-10-CM | POA: Diagnosis not present

## 2021-09-26 DIAGNOSIS — E782 Mixed hyperlipidemia: Secondary | ICD-10-CM

## 2021-09-26 NOTE — Patient Instructions (Signed)
Medication Instructions:  ?- Your physician recommends that you continue on your current medications as directed. Please refer to the Current Medication list given to you today. ? ?*If you need a refill on your cardiac medications before your next appointment, please call your pharmacy* ? ? ?Lab Work: ?- none ordered ? ?If you have labs (blood work) drawn today and your tests are completely normal, you will receive your results only by: ?MyChart Message (if you have MyChart) OR ?A paper copy in the mail ?If you have any lab test that is abnormal or we need to change your treatment, we will call you to review the results. ? ? ?Testing/Procedures: ?- none ordered ? ? ?Follow-Up: ?At [facility], you and your health needs are our priority.  As part of our continuing mission to provide you with exceptional heart care, we have created designated Provider Care Teams.  These Care Teams include your primary Cardiologist (physician) and Advanced Practice Providers (APPs -  Physician Assistants and Nurse Practitioners) who all work together to provide you with the care you need, when you need it. ? ?We recommend signing up for the patient portal called "MyChart".  Sign up information is provided on this After Visit Summary.  MyChart is used to connect with patients for Virtual Visits (Telemedicine).  Patients are able to view lab/test results, encounter notes, upcoming appointments, etc.  Non-urgent messages can be sent to your provider as well.   ?To learn more about what you can do with MyChart, go to [email or web address].   ? ?Your next appointment:   ?1 year(s) ? ?The format for your next appointment:   ?In Person ? ?Provider:   ?Cameron Lambert, MD  ? ? ?Other Instructions ?N/a ? ?

## 2021-09-26 NOTE — Progress Notes (Signed)
? ?Virtual Visit via Video Note  ? ?This visit type was conducted due to national recommendations for restrictions regarding the COVID-19 Pandemic (e.g. social distancing) in an effort to limit this patient's exposure and mitigate transmission in our community.  Due to his co-morbid illnesses, this patient is at least at moderate risk for complications without adequate follow up.  This format is felt to be most appropriate for this patient at this time.  All issues noted in this document were discussed and addressed.  A limited physical exam was performed with this format.  Please refer to the patient's chart for his consent to telehealth for [facility]. ? ?   ? ? ?Date:  09/26/2021  ? ?ID:  Dylan Olsen, DOB 02/13/1956, MRN [id number] ?The patient was identified using 2 identifiers. ? ?Patient Location: Home ?Provider Location: Office/Clinic ? ? ?PCP:  Lesleigh Noe, MD ?  ?[facility] Providers ?Cardiologist:  None ?Electrophysiologist:  Vickie Epley, MD    ? ?Evaluation Performed:  Follow-Up Visit ? ?Chief Complaint: Atrial fibrillation ? ?History of Present Illness:   ? ?Dylan Olsen is a 66 y.o. male with atrial fibrillation who I am seeing in follow-up.  He underwent a successful atrial fibrillation ablation on April 07, 2020.  The patient has not had a recurrence of his arrhythmia.  I last saw the patient March 28, 2021.  He has a loop recorder in place which is shown no recurrence of A-fib.  We plan to discuss stopping his blood thinner at today's appointment.  He is still very interested.  His wife joins [place] on our video visit today. ? ? ?Past Medical History:  ?Diagnosis Date  ? Complication of anesthesia   ? difficult to wake, stopped breathing, bagged  ? Pericarditis   ? a. 04/2020 following Afib ablation.  ? Persistent atrial fibrillation ([place])   ? a. 04/2020 s/p PVI.  ? ?Past Surgical History:  ?Procedure Laterality Date  ? ATRIAL FIBRILLATION ABLATION N/A 04/07/2020  ?  Procedure: ATRIAL FIBRILLATION ABLATION;  Surgeon: Vickie Epley, MD;  Location: [place] [facility];  Service: Cardiovascular;  Laterality: N/A;  ? BACK SURGERY    ? BUBBLE STUDY  04/06/2020  ? Procedure: BUBBLE STUDY;  Surgeon: Werner Lean, MD;  Location: [place];  Service: Cardiovascular;;  ? FOOT SURGERY    ? KNEE SURGERY Left   ? TEE WITHOUT CARDIOVERSION N/A 04/06/2020  ? Procedure: TRANSESOPHAGEAL ECHOCARDIOGRAM (TEE);  Surgeon: Werner Lean, MD;  Location[facility];  Service: Cardiovascular;  Laterality: N/A;  ?  ? ?Current Meds  ?Medication Sig  ? acetaminophen (TYLENOL) 500 MG tablet Take 1,000 mg by mouth every 6 (six) hours as needed (for pain.).  ? atorvastatin (LIPITOR) 20 MG tablet Take 1 tablet (20 mg total) by mouth daily.  ? diazepam (VALIUM) 2 MG tablet Take 0.5-1 tablets (1-2 mg total) by mouth every 8 (eight) hours as needed (vertigo).  ? lisinopril (ZESTRIL) 10 MG tablet TAKE 1 TABLET BY MOUTH EVERY DAY  ? Meclizine HCl (BONINE PO) Take 1 tablet by mouth 2 (two) times daily as needed (vertigo/dizziness.). Take as needed for vertigo  ? pantoprazole (PROTONIX) 40 MG tablet Take 40 mg by mouth daily.  ? sildenafil (REVATIO) 20 MG tablet Take 3-5 tablets (60-100 mg total) by mouth daily as needed (intercourse).  ? [DISCONTINUED] ELIQUIS 5 MG TABS tablet TAKE 1 TABLET BY MOUTH TWICE A DAY  ?  ? ?Allergies:  Other  ? ?Social History  ? ?Tobacco Use  ? Smoking status: Every Day  ?  Types: Cigars  ? Smokeless tobacco: Never  ? Tobacco comments:  ?  a few times a year  ?Vaping Use  ? Vaping Use: Never used  ?Substance Use Topics  ? Alcohol use: Yes  ?  Comment: less than monthly  ? Drug use: Yes  ?  Types: Marijuana  ?  Comment: a few times a week  ?  ? ?Family Hx: ?The patient's family history includes Cancer in his cousin; Heart attack (age of onset: 47) in his father; Heart disease in his father. ? ?ROS:   ?Please see the history of present illness.    ? ?All other  systems reviewed and are negative. ? ? ?Prior CV studies:   ?The following studies were reviewed today: ? ?Loop recorder interrogations personally reviewed ? ?Labs/Other Tests and Data Reviewed:   ? ? ? ?Recent Labs: ?01/09/2021: ALT 62; BUN 11; Creatinine, Ser 0.75; Hemoglobin 13.8; Platelets 219.0; Potassium 3.9; Sodium 143  ? ?Recent Lipid Panel ?Lab Results  ?Component Value Date/Time  ? CHOL 222 (H) 01/09/2021 03:23 PM  ? TRIG 357.0 (H) 01/09/2021 03:23 PM  ? HDL 44.70 01/09/2021 03:23 PM  ? CHOLHDL 5 01/09/2021 03:23 PM  ? LDLDIRECT 99.0 01/09/2021 03:23 PM  ? ? ?Wt Readings from Last 3 Encounters:  ?09/26/21 195 lb (88.5 kg)  ?04/16/21 196 lb (88.9 kg)  ?03/28/21 198 lb (89.8 kg)  ?  ? ?    ?Objective:   ? ?Vital Signs:  BP 123/87 (BP Location: Left Arm, Patient Position: Sitting, Cuff Size: Normal)   Pulse 80   Ht 5\' 11"  (1.803 m)   Wt 195 lb (88.5 kg)   BMI 27.20 kg/m?   ? ?VITAL SIGNS:  reviewed ?PSYCH:  normal affect ? ?ASSESSMENT & PLAN:   ? ?#Persistent atrial fibrillation ?Doing well after his ablation without recurrence.  We discussed the pros and cons of stopping his anticoagulant during today's appointment.  He understands that there is a slight increase in risk being off anticoagulant and understands the role of loop recorder monitoring for atrial fibrillation surveillance/recurrence.  The patient would like to proceed with stopping his anticoagulant which I think is reasonable. ? ?#Hyperlipidemia ?Continue atorvastatin ? ?Time:   ?Today, I have spent 8 minutes with the patient with telehealth technology discussing the above problems.   ? ? ?Medication Adjustments/Labs and Tests Ordered: ?Current medicines are reviewed at length with the patient today.  Concerns regarding medicines are outlined above.  ? ?Tests Ordered: ?No orders of the defined types were placed in this encounter. ? ? ?Medication Changes: ?No orders of the defined types were placed in this encounter. ? ? ?Follow-up 1 year or  sooner as needed. ? ? ?Signed, ?Vickie Epley, MD  ?09/26/2021 8:58 PM    ?[place] ? ?

## 2021-10-01 ENCOUNTER — Ambulatory Visit (INDEPENDENT_AMBULATORY_CARE_PROVIDER_SITE_OTHER): Payer: 59

## 2021-10-01 DIAGNOSIS — I4819 Other persistent atrial fibrillation: Secondary | ICD-10-CM

## 2021-10-03 LAB — CUP PACEART REMOTE DEVICE CHECK
Date Time Interrogation Session: 20230326231351
Implantable Pulse Generator Implant Date: 20220316

## 2021-10-10 NOTE — Progress Notes (Signed)
Carelink Summary Report / Loop Recorder 

## 2021-11-05 ENCOUNTER — Ambulatory Visit (INDEPENDENT_AMBULATORY_CARE_PROVIDER_SITE_OTHER): Payer: 59

## 2021-11-05 DIAGNOSIS — I4819 Other persistent atrial fibrillation: Secondary | ICD-10-CM | POA: Diagnosis not present

## 2021-11-05 LAB — CUP PACEART REMOTE DEVICE CHECK
Date Time Interrogation Session: 20230428230323
Implantable Pulse Generator Implant Date: 20220316

## 2021-11-19 NOTE — Progress Notes (Signed)
Carelink Summary Report / Loop Recorder 

## 2021-12-10 ENCOUNTER — Ambulatory Visit (INDEPENDENT_AMBULATORY_CARE_PROVIDER_SITE_OTHER): Payer: 59

## 2021-12-10 DIAGNOSIS — I4819 Other persistent atrial fibrillation: Secondary | ICD-10-CM | POA: Diagnosis not present

## 2021-12-11 LAB — CUP PACEART REMOTE DEVICE CHECK
Date Time Interrogation Session: 20230531230814
Implantable Pulse Generator Implant Date: 20220316

## 2021-12-25 NOTE — Progress Notes (Signed)
Carelink Summary Report / Loop Recorder 

## 2022-01-11 ENCOUNTER — Telehealth: Payer: Self-pay

## 2022-01-11 DIAGNOSIS — I4819 Other persistent atrial fibrillation: Secondary | ICD-10-CM

## 2022-01-11 LAB — CUP PACEART REMOTE DEVICE CHECK
Date Time Interrogation Session: 20230703230452
Implantable Pulse Generator Implant Date: 20220316

## 2022-01-11 MED ORDER — APIXABAN 5 MG PO TABS: 5.0000 mg | ORAL_TABLET | Freq: Two times a day (BID) | ORAL | 3 refills | Status: DC

## 2022-01-11 NOTE — Telephone Encounter (Signed)
Successful telephone encounter to patient's wife as patient is not available at this time (on [place]). Discussed Dr. Geannie Risen recommendations to restart [facility] for AF recurrence. Confirmed dosage with pharmacist. Eliquis 5 mg BID sent to patient's pharmacy on file. Pharmacist also recommended CBC and BMET if not recently obtained. Wife confirms patient has not had recent labs. Orders placed for CBC and BMET. Dr. [name] would also like to see patient in clinic to discuss AF. Routing to scheduling.

## 2022-01-11 NOTE — Telephone Encounter (Signed)
LINQ alert received. 1-AF lasting 2:50 hours. V response 100-170's bpm. Routing to triage for ? new AF.Marland Kitchen  Known history of persistent atrial fib. Ablation 04/07/20 without recurrence until December 27, 2021 episode. Video visit 09/26/21 with Dr. [name] indicates [facility] was discontinued after discussing risk with patient and wife. Routing to Dr. [name] for review and advisement.

## 2022-01-11 NOTE — Addendum Note (Signed)
Addended by: Yvone Neu E on: 01/11/2022 02:37 PM   Modules accepted: Orders

## 2022-01-14 ENCOUNTER — Other Ambulatory Visit: Payer: 59

## 2022-01-14 ENCOUNTER — Ambulatory Visit (INDEPENDENT_AMBULATORY_CARE_PROVIDER_SITE_OTHER): Payer: 59

## 2022-01-14 DIAGNOSIS — I4819 Other persistent atrial fibrillation: Secondary | ICD-10-CM

## 2022-01-15 LAB — BASIC METABOLIC PANEL
BUN/Creatinine Ratio: 8 — ABNORMAL LOW (ref 10–24)
BUN: 7 mg/dL — ABNORMAL LOW (ref 8–27)
CO2: 25 mmol/L (ref 20–29)
Calcium: 9.6 mg/dL (ref 8.6–10.2)
Chloride: 102 mmol/L (ref 96–106)
Creatinine, Ser: 0.86 mg/dL (ref 0.76–1.27)
Glucose: 81 mg/dL (ref 70–99)
Potassium: 3.7 mmol/L (ref 3.5–5.2)
Sodium: 143 mmol/L (ref 134–144)
eGFR: 95 mL/min/{1.73_m2} (ref >59)

## 2022-01-15 LAB — CBC
Hematocrit: 42.4 % (ref 37.5–51.0)
Hemoglobin: 14.6 g/dL (ref 13.0–17.7)
MCH: 31.3 pg (ref 26.6–33.0)
MCHC: 34.4 g/dL (ref 31.5–35.7)
MCV: 91 fL (ref 79–97)
Platelets: 206 10*3/uL (ref 150–450)
RBC: 4.67 x10E6/uL (ref 4.14–5.80)
RDW: 12.5 % (ref 11.6–15.4)
WBC: 7.9 10*3/uL (ref 3.4–10.8)

## 2022-01-17 ENCOUNTER — Telehealth: Payer: Self-pay | Admitting: Family Medicine

## 2022-01-17 DIAGNOSIS — E782 Mixed hyperlipidemia: Secondary | ICD-10-CM

## 2022-01-17 NOTE — Telephone Encounter (Signed)
Patient has been scheduled

## 2022-02-04 NOTE — Progress Notes (Signed)
Carelink Summary Report / Loop Recorder 

## 2022-02-12 ENCOUNTER — Encounter: Payer: Self-pay | Admitting: Family Medicine

## 2022-02-12 ENCOUNTER — Ambulatory Visit (INDEPENDENT_AMBULATORY_CARE_PROVIDER_SITE_OTHER): Payer: 59 | Admitting: Family Medicine

## 2022-02-12 VITALS — BP 138/72 | HR 60 | Temp 97.6°F | Ht 71.5 in | Wt 199.0 lb

## 2022-02-12 DIAGNOSIS — E782 Mixed hyperlipidemia: Secondary | ICD-10-CM | POA: Diagnosis not present

## 2022-02-12 DIAGNOSIS — I10 Essential (primary) hypertension: Secondary | ICD-10-CM | POA: Diagnosis not present

## 2022-02-12 DIAGNOSIS — R195 Other fecal abnormalities: Secondary | ICD-10-CM | POA: Diagnosis not present

## 2022-02-12 DIAGNOSIS — Z0000 Encounter for general adult medical examination without abnormal findings: Secondary | ICD-10-CM

## 2022-02-12 LAB — LIPID PANEL
Cholesterol: 196 mg/dL (ref 0–200)
HDL: 38.8 mg/dL — ABNORMAL LOW (ref >39.00)
NonHDL: 157.05
Total CHOL/HDL Ratio: 5
Triglycerides: 280 mg/dL — ABNORMAL HIGH (ref 0.0–149.0)
VLDL: 56 mg/dL — ABNORMAL HIGH (ref 0.0–40.0)

## 2022-02-12 LAB — CBC
HCT: 37.9 % — ABNORMAL LOW (ref 39.0–52.0)
Hemoglobin: 13.5 g/dL (ref 13.0–17.0)
MCHC: 35.7 g/dL (ref 30.0–36.0)
MCV: 88.5 fl (ref 78.0–100.0)
Platelets: 195 10*3/uL (ref 150.0–400.0)
RBC: 4.28 Mil/uL (ref 4.22–5.81)
RDW: 12.8 % (ref 11.5–15.5)
WBC: 5 10*3/uL (ref 4.0–10.5)

## 2022-02-12 LAB — LDL CHOLESTEROL, DIRECT: Direct LDL: 84 mg/dL

## 2022-02-12 MED ORDER — ATORVASTATIN CALCIUM 20 MG PO TABS: 20.0000 mg | ORAL_TABLET | Freq: Every day | ORAL | 1 refills | Status: DC

## 2022-02-12 MED ORDER — LISINOPRIL 10 MG PO TABS: 10.0000 mg | ORAL_TABLET | Freq: Every day | ORAL | 1 refills | Status: DC

## 2022-02-12 NOTE — Patient Instructions (Addendum)
You are due for the following Vaccines - Shingles - Tdap - Pneumonia  Schedule a nurse visit for vaccines - specifically would recommend tdap and shingles before you retire  #Referral I have placed a referral to a specialist for you. You should receive a phone call from the specialty office. Make sure your voicemail is not full and that if you are able to answer your phone to unknown or new numbers.   It may take up to 2 weeks to hear about the referral. If you do not hear anything in 2 weeks, please call our office and ask to speak with the referral coordinator.  - Gastroenterologist

## 2022-02-12 NOTE — Assessment & Plan Note (Signed)
Referral to GI last year - pt never went. Discussion regarding need for evaluation to r/o colon cancer. Referral placed to [facility].

## 2022-02-12 NOTE — Progress Notes (Signed)
Annual Exam   Chief Complaint:  Chief Complaint  Patient presents with   Annual Exam    Patient needs refills but does not know what he needs refilled    History of Present Illness:  Dylan Olsen is a 66 y.o. presents today for annual examination.    #a fib - the monitor has been going off - has noticed some fluttering - seeing cardiology - gets a sensation in his neck  #BPPV  - at night lasts for 15 seconds - will happen every morning  Nutrition/Lifestyle Diet: anything he wants, tries to do more salads Exercise: active job He is single partner, contraception - post menopausal status.  Any issues with getting or keeping erection? No  Social History   Tobacco Use  Smoking Status Former   Types: Cigars  Smokeless Tobacco Never  Tobacco Comments   a few times a year   Social History   Substance and Sexual Activity  Alcohol Use Yes   Comment: less than monthly   Social History   Substance and Sexual Activity  Drug Use Yes   Types: Marijuana   Comment: weekend     Safety The patient wears seatbelts: yes.     The patient feels safe at home and in their relationships: yes.  General Health Dentist in the last year: No Eye doctor: no  Weight Wt Readings from Last 3 Encounters:  02/12/22 199 lb (90.3 kg)  09/26/21 195 lb (88.5 kg)  04/16/21 196 lb (88.9 kg)   Patient has high BMI  BMI Readings from Last 1 Encounters:  02/12/22 27.37 kg/m     Chronic disease screening Blood pressure monitoring:  BP Readings from Last 3 Encounters:  02/12/22 138/72  09/26/21 123/87  04/16/21 140/84    Lipid Monitoring: Indication for screening: age >35, obesity, diabetes, family hx, CV risk factors.  Lipid screening: Yes  Lab Results  Component Value Date   CHOL 222 (H) 01/09/2021   HDL 44.70 01/09/2021   LDLDIRECT 99.0 01/09/2021   TRIG 357.0 (H) 01/09/2021   CHOLHDL 5 01/09/2021     Diabetes Screening: age >26, overweight, family hx, PCOS, hx of  gestational diabetes, at risk ethnicity, elevated blood pressure >135/80.  Diabetes Screening screening: Not Indicated  Lab Results  Component Value Date   HGBA1C 5.3 01/13/2020     Prostate Cancer Screening: Yes Age 62-69 yo Shared Decision Making Higher Risk: Older age, African American, Family Hx of Prostate Cancer - No Benefits: screening may prevent 1.3 deaths from prostate cancer over 13 years per 1000 men screened and prevent 3 metastatic cases per 1000 men screened. Not enough evidence to support more benefit for AA or FMH Harms: False Positive and psychological harms. 15% of me with false positive over a 2 to 4 year period > resulting in biopsy and complications such as pain, hematospermia, infections. Overdiagnosis - increases with age - found that 20-50% of prostate cancer through screening may have never caused any issues. Harms of treatment include - erectile dysfunction, urinary incontinence, and bothersome bowel symptoms.   After discussion he does not want to get a PSA checked today.   Inadequate evidence for screening <55 No mortality benefit for screening >70   No results found for: "PSA1", "PSA"     Colon Cancer Screening:  Age 61-75 yo - benefits outweigh the risk. Adults 84-85 yo who have never been screened benefit.  Benefits: 134000 people in 2016 will be diagnosed and 49,000 will die -  early detection helps Harms: Complications 2/2 to colonoscopy High Risk (Colonoscopy): genetic disorder (Lynch syndrome or familial adenomatous polyposis), personal hx of IBD, previous adenomatous polyp, or previous colorectal cancer, FamHx start 10 years before the age at diagnosis, increased in males and black race  Options:  FIT - looks for hemoglobin (blood in the stool) - specific and fairly sensitive - must be done annually Cologuard - looks for DNA and blood - more sensitive - therefore can have more false positives, every 3 years Colonoscopy - every 10 years if normal -  sedation, bowl prep, must have someone drive you  Shared decision making and the patient had decided to do colonoscopy as hx of positive fobt   Social History   Tobacco Use  Smoking Status Former   Types: Cigars  Smokeless Tobacco Never  Tobacco Comments   a few times a year    Lung Cancer Screening (Ages 3150-80): no 20 year pack history? No Current Tobacco user? Yes Quit less than 15 years ago? Yes Interested in low dose CT for lung cancer screening? no  Abdominal Aortic Aneurysm:  Age 66-75, 1 time screening, men who have ever smoked declined    Past Medical History:  Diagnosis Date   Complication of anesthesia    difficult to wake, stopped breathing, bagged   Pericarditis    a. 04/2020 following Afib ablation.   Persistent atrial fibrillation (HCC)    a. 04/2020 s/p PVI.    Past Surgical History:  Procedure Laterality Date   ATRIAL FIBRILLATION ABLATION N/A 04/07/2020   Procedure: ATRIAL FIBRILLATION ABLATION;  Surgeon: Lanier PrudeLambert, Cameron T, MD;  Location: [facility];  Service: Cardiovascular;  Laterality: N/A;   BACK SURGERY     BUBBLE STUDY  04/06/2020   Procedure: BUBBLE STUDY;  Surgeon: Christell Constanthandrasekhar, Mahesh A, MD;  Location: [facility];  Service: Cardiovascular;;   FOOT SURGERY     KNEE SURGERY Left    TEE WITHOUT CARDIOVERSION N/A 04/06/2020   Procedure: TRANSESOPHAGEAL ECHOCARDIOGRAM (TEE);  Surgeon: Christell Constanthandrasekhar, Mahesh A, MD;  [facility];  Service: Cardiovascular;  Laterality: N/A;    Prior to Admission medications   Medication Sig Start Date End Date Taking? Authorizing Provider  apixaban (ELIQUIS) 5 MG TABS tablet Take 1 tablet (5 mg total) by mouth 2 (two) times daily. 01/11/22  Yes Lanier PrudeLambert, Cameron T, MD  atorvastatin (LIPITOR) 20 MG tablet TAKE 1 TABLET BY MOUTH EVERY DAY 01/17/22  Yes Lynnda Childody, Elick Aguilera R, MD  diazepam (VALIUM) 2 MG tablet Take 0.5-1 tablets (1-2 mg total) by mouth every 8 (eight) hours as needed (vertigo). 08/02/20  Yes  Copland, Karleen HampshireSpencer, MD  lisinopril (ZESTRIL) 10 MG tablet TAKE 1 TABLET BY MOUTH EVERY DAY 05/11/21  Yes Lynnda Childody, King Pinzon R, MD  Meclizine HCl (BONINE PO) Take 1 tablet by mouth 2 (two) times daily as needed (vertigo/dizziness.). Take as needed for vertigo   Yes [provider]  pantoprazole (PROTONIX) 40 MG tablet Take 40 mg by mouth daily.   Yes [provider]  sildenafil (REVATIO) 20 MG tablet Take 3-5 tablets (60-100 mg total) by mouth daily as needed (intercourse). 05/17/21  Yes Lynnda Childody, Nolita Kutter R, MD    Allergies  Allergen Reactions   Other Other (See Comments)    Pt reports he "coded after having anesthesia with a back surgery"  Can not recall what medicine     Social History   Socioeconomic History   Marital status: Married    Spouse name: Stevphen MeuseMerri  Number of children: 2   Years of education: high school   Highest education level: Not on file  Occupational History   Occupation: Curator  Tobacco Use   Smoking status: Former    Types: Cigars   Smokeless tobacco: Never   Tobacco comments:    a few times a year  Vaping Use   Vaping Use: Never used  Substance and Sexual Activity   Alcohol use: Yes    Comment: less than monthly   Drug use: Yes    Types: Marijuana    Comment: weekend   Sexual activity: Yes    Birth control/protection: Post-menopausal  Other Topics Concern   Not on file  Social History Narrative   01/13/20   From: [place], [place] originally - moved to [place] 1996   Living: with wife [name] (1996), and step daughter (special needs)   Work: English as a second language teacher for [employer]      Family: 2 children - [name] and Russell - in [place], good relationship and 3 grandchildren and 2 step children      Enjoys: drag racing, football      Exercise: not currently - walking and mobile job   Diet: not great, sometimes a little better      Safety   Seat belts: Yes    Guns: Yes  and secure   Safe in relationships: Yes    Social Determinants of Manufacturing engineer Strain: Not on file  Food Insecurity: Not on file  Transportation Needs: Not on file  Physical Activity: Not on file  Stress: Not on file  Social Connections: Not on file  Intimate Partner Violence: Not on file    Family History  Problem Relation Age of Onset   Heart disease Father    Heart attack Father 24   Cancer Cousin        unknown primary    Review of Systems  Constitutional:  Negative for chills and fever.  HENT:  Negative for congestion and sore throat.   Eyes:  Negative for blurred vision and double vision.  Respiratory:  Negative for shortness of breath.   Cardiovascular:  Negative for chest pain.  Gastrointestinal:  Negative for heartburn, nausea and vomiting.  Genitourinary: Negative.   Musculoskeletal: Negative.  Negative for myalgias.  Skin:  Negative for rash.  Neurological:  Negative for dizziness and headaches.  Endo/Heme/Allergies:  Does not bruise/bleed easily.  Psychiatric/Behavioral:  Negative for depression. The patient is not nervous/anxious.      Physical Exam BP 138/72 (BP Location: Left Arm, Patient Position: Sitting, Cuff Size: Normal)   Pulse 60   Temp 97.6 F (36.4 C) (Temporal)   Ht 5' 11.5" (1.816 m)   Wt 199 lb (90.3 kg)   SpO2 98%   BMI 27.37 kg/m    BP Readings from Last 3 Encounters:  02/12/22 138/72  09/26/21 123/87  04/16/21 140/84      Physical Exam Constitutional:      General: He is not in acute distress.    Appearance: He is well-developed. He is not diaphoretic.  HENT:     Head: Normocephalic and atraumatic.     Right Ear: Tympanic membrane and ear canal normal.     Left Ear: Tympanic membrane and ear canal normal.     Nose: Nose normal.     Mouth/Throat:     Pharynx: Uvula midline.  Eyes:     General: No scleral icterus.    Conjunctiva/sclera: Conjunctivae normal.  Pupils: Pupils are equal, round, and reactive to light.  Cardiovascular:     Rate and Rhythm: Normal rate and regular  rhythm.     Heart sounds: Normal heart sounds. No murmur heard. Pulmonary:     Effort: Pulmonary effort is normal. No respiratory distress.     Breath sounds: Normal breath sounds. No wheezing.  Abdominal:     General: Bowel sounds are normal. There is no distension.     Palpations: Abdomen is soft. There is no mass.     Tenderness: There is no abdominal tenderness. There is no guarding.  Musculoskeletal:        General: Normal range of motion.     Cervical back: Normal range of motion and neck supple.  Lymphadenopathy:     Cervical: No cervical adenopathy.  Skin:    General: Skin is warm and dry.     Capillary Refill: Capillary refill takes less than 2 seconds.  Neurological:     Mental Status: He is alert and oriented to person, place, and time.        Results:  PHQ-9:     02/12/2022    9:23 AM 02/06/2021    3:44 PM 01/13/2020    4:28 PM  Depression screen PHQ 2/9  Decreased Interest 0 0 0  Down, Depressed, Hopeless 0 0 0  PHQ - 2 Score 0 0 0       Assessment: 65 y.o. here for routine annual physical examination.  Plan: Problem List Items Addressed This Visit       Cardiovascular and Mediastinum   Primary hypertension   Relevant Medications   atorvastatin (LIPITOR) 20 MG tablet   lisinopril (ZESTRIL) 10 MG tablet   Other Relevant Orders   CBC     Other   Mixed hyperlipidemia   Relevant Medications   atorvastatin (LIPITOR) 20 MG tablet   lisinopril (ZESTRIL) 10 MG tablet   Other Relevant Orders   Lipid panel   Positive fecal occult blood test    Referral to GI last year - pt never went. Discussion regarding need for evaluation to r/o colon cancer. Referral placed to [place].       Relevant Orders   Ambulatory referral to Gastroenterology   Other Visit Diagnoses     Annual physical exam    -  Primary       Screening: -- Blood pressure screen normal -- cholesterol screening: will obtain -- Weight screening: overweight: continue to  monitor -- Diabetes Screening: will obtain -- Nutrition: normal - Encouraged healthy diet  The 10-year ASCVD risk score (Arnett DK, et al., 2019) is: 26.8%   Values used to calculate the score:     Age: 84 years     Sex: Male     Is Non-Hispanic African American: No     Diabetic: No     Tobacco smoker: Yes     Systolic Blood Pressure: 138 mmHg     Is BP treated: Yes     HDL Cholesterol: 44.7 mg/dL     Total Cholesterol: 222 mg/dL  -- ASA 81 mg discussed if CVD risk >10% age 57-59 and willing to take for 10 years -- Statin therapy for Age 51-75 with CVD risk >7.5%  Psych -- Depression screening (PHQ-9): negative  Safety -- tobacco screening:  working on quitting cigars -- alcohol screening: low-risk usage. -- no evidence of domestic violence or intimate partner violence.  Cancer Screening -- Prostate (age 25-69) declined -- Colon (age 17-75)  positive FOBT last year, never saw GI - referral and discussion today for need for f/u -- Lung not indicated   Immunizations Immunization History  Administered Date(s) Administered   PFIZER(Purple Top)SARS-COV-2 Vaccination 01/08/2020, 01/29/2020, 01/27/2021    -- flu vaccine not available -- TDAP q10 years declined -- Shingles (age >22) declined -- PPSV-23 (19-64 with chronic disease or smoking) declined -- PCV-13 (age >3) - one dose followed by PPSV-23 1 year later declined -- Covid-19 Vaccine up to date  Encouraged regular vision and dental screening. Encouraged healthy exercise and diet.   Lynnda Child

## 2022-02-14 LAB — CUP PACEART REMOTE DEVICE CHECK
Date Time Interrogation Session: 20230805231309
Implantable Pulse Generator Implant Date: 20220316

## 2022-02-18 ENCOUNTER — Ambulatory Visit: Payer: 59

## 2022-02-27 ENCOUNTER — Ambulatory Visit: Payer: 59 | Admitting: Family Medicine

## 2022-02-27 ENCOUNTER — Telehealth: Payer: Self-pay

## 2022-02-27 VITALS — BP 138/80 | HR 58 | Temp 97.4°F | Wt 196.4 lb

## 2022-02-27 DIAGNOSIS — J302 Other seasonal allergic rhinitis: Secondary | ICD-10-CM | POA: Diagnosis not present

## 2022-02-27 DIAGNOSIS — H8112 Benign paroxysmal vertigo, left ear: Secondary | ICD-10-CM

## 2022-02-27 MED ORDER — MECLIZINE HCL 25 MG PO TABS: 25.0000 mg | ORAL_TABLET | Freq: Three times a day (TID) | ORAL | 0 refills | Status: DC | PRN

## 2022-02-27 NOTE — Telephone Encounter (Signed)
I spoke with pt; pt has had vertigo for years. Pt said discussed dizziness at 02/12/22 annual exam but dizziness has worsened last 2 days. BP now is 147/87 P?. Pt said the room is spinning around this morning. Pt does not have H/A,CP,SOB, or vision changes. Pt will have someone drive pt to appt with Dr Selena Batten 02/27/22 at 11 AM.UC & ED precautions given and pt voiced understanding. Sending note to Dr Selena Batten.

## 2022-02-27 NOTE — Progress Notes (Signed)
Subjective:     Dylan Olsen is a 66 y.o. male presenting for Dizziness (X week and a half, but gotten worse over time. Increases when pt turns head to the left. Pt also states that L ear seems to get hot. )     HPI  #BPPV - used to have patches - has had meclizine - tried a liquid roller - essential oils - also took a antinausea medication - 10 days  - slowly getting worse - worse with turning to the left - left ear feels hot - has tried some of the home exercises w/o improvement - nose has been running all summer long - sinus issues over the last few years - trying allergy medication - zyrtec - w/o improvement  Review of Systems   Social History   Tobacco Use  Smoking Status Former   Types: Cigars  Smokeless Tobacco Never  Tobacco Comments   a few times a year        Objective:    BP Readings from Last 3 Encounters:  02/27/22 138/80  02/12/22 138/72  09/26/21 123/87   Wt Readings from Last 3 Encounters:  02/27/22 196 lb 6 oz (89.1 kg)  02/12/22 199 lb (90.3 kg)  09/26/21 195 lb (88.5 kg)    BP 138/80   Pulse (!) 58   Temp (!) 97.4 F (36.3 C) (Temporal)   Wt 196 lb 6 oz (89.1 kg)   SpO2 97%   BMI 27.01 kg/m    Physical Exam Constitutional:      Appearance: Normal appearance. He is not ill-appearing or diaphoretic.  HENT:     Head: Normocephalic and atraumatic.     Right Ear: Tympanic membrane, ear canal and external ear normal.     Left Ear: Tympanic membrane, ear canal and external ear normal.     Nose: Nose normal.  Eyes:     General: No scleral icterus.    Extraocular Movements: Extraocular movements intact.     Right eye: Nystagmus (with rightward gaze) present.     Conjunctiva/sclera: Conjunctivae normal.  Cardiovascular:     Rate and Rhythm: Normal rate and regular rhythm.     Heart sounds: No murmur heard. Pulmonary:     Effort: Pulmonary effort is normal. No respiratory distress.     Breath sounds: Normal breath sounds.   Musculoskeletal:     Cervical back: Neck supple.  Skin:    General: Skin is warm and dry.  Neurological:     Mental Status: He is alert. Mental status is at baseline.  Psychiatric:        Mood and Affect: Mood normal.           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 Symptoms persisting over 10 days.  Continue to be intermittent.  Handout for home exercise, prescription for meclizine provided.  Discussed physical therapy now, patient would like to wait.  Referral to ear nose and throat placed today so if symptoms do not improve he has an appointment in place.  Treat allergies.      Relevant Medications   meclizine (ANTIVERT) 25 MG tablet   Other Relevant Orders   Ambulatory referral to ENT     Other   Seasonal allergies    Patient notes vertigo worsens in spring and fall, discussed importance  of taking daily allergy medicine.  He cannot tolerate Flonase.  He will try various over-the-counter medications to find one that will work.  Try Xyzal.        Return if symptoms worsen or fail to improve.  Lynnda Child, MD

## 2022-02-27 NOTE — Telephone Encounter (Signed)
[facility] Day - Client TELEPHONE ADVICE RECORD AccessNurse [name] Gender: Male DOB: 03/09/1956 Age: 66 Y 3 M 3 D Return Phone Number: [phone] (Primary), [phone] (Secondary) Address: [place] [address] Client [facility] - Client Client Site [facility] - Day Provider Gweneth Dimitri- MD Contact Type Call Who Is Calling Patient / Member / Family / Caregiver Call Type Triage / Clinical Relationship To Patient Self Return Phone Number [phone] (Secondary) Chief Complaint Dizziness Reason for Call Symptomatic / Request for Health Information Initial Comment Caller states he has vertigo again. GOTO Facility Not Listed UC Translation No Nurse Assessment Nurse: Humfleet, RN, Marchelle Folks Date/Time (Eastern Time): 02/27/2022 8:22:15 AM Confirm and document reason for call. If symptomatic, describe symptoms. ---caller states he has vertigo. been slowly coming on for a week. Does the patient have any new or worsening symptoms? ---Yes Will a triage be completed? ---Yes Related visit to physician within the last 2 weeks? ---Yes Does the PT have any chronic conditions? (i.e. diabetes, asthma, this includes High risk factors for pregnancy, etc.) ---Yes List chronic conditions. ---cardiac ablation for afib on blood thinner, HTN Is this a behavioral health or substance abuse call? ---No Guidelines Guideline Title Affirmed Question Affirmed Notes Nurse Date/Time (Eastern Time) Dizziness - Vertigo [1] MODERATE dizziness (e.g., vertigo; feels very unsteady, interferes with normal activities) AND [2] has been evaluated by doctor (or NP/PA) for this Humfleet, RN, Marchelle Folks 02/27/2022 8:23:23 AM PLEASE NOTE: All timestamps contained within this report are represented as [place] Standard Time. CONFIDENTIALTY NOTICE: This fax transmission is intended only for the  addressee. It contains information that is legally privileged, confidential or otherwise protected from use or disclosure. If you are not the intended recipient, you are strictly prohibited from reviewing, disclosing, copying using or disseminating any of this information or taking any action in reliance on or regarding this information. If you have received this fax in error, please notify us immediately by telephone so that we can arrange for its return to [place]. Phone: [phone], Toll-Free: [phone], Fax: [phone] Page: 2 of 2 Call Id: [id number] Disp. Time Lamount Cohen Time) Disposition Final User 02/27/2022 8:30:09 AM SEE PCP WITHIN 3 DAYS Yes Humfleet, RN, Marchelle Folks Final Disposition 02/27/2022 8:30:09 AM SEE PCP WITHIN 3 DAYS Yes Humfleet, RN, Earnestine Leys Disagree/Comply Comply Caller Understands Yes PreDisposition Did not know what to do Care Advice Given Per Guideline * You need to be seen within 2 or 3 days. SEE PCP WITHIN 3 DAYS: * PCP VISIT: Call your doctor (or NP/PA) during regular office hours and make an appointment. A clinic or urgent care center are good places to go for care if your doctor's office is closed or you can't get an appointment. NOTE: If office will be open tomorrow, tell caller to call then, not in 3 days. CARE ADVICE given per Dizziness - Vertigo (Adult) guideline. CALL BACK IF: * Severe headache occurs * Weakness develops in an arm or leg * Unable to walk without falling * You become worse Comments User: Jaclynn Major, RN Date/Time Lamount Cohen Time): 02/27/2022 8:24:53 AM says he gets vertigo on "the left side" User: Jaclynn Major, RN Date/Time Lamount Cohen Time): 02/27/2022 8:31:51 AM patient states vertigo is a chronic issue for him for 15+ years. says this is not any worse than he has had before and it has been worse in the past. 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 Referrals GO TO FACILITY OTHER - [facility]

## 2022-02-27 NOTE — Patient Instructions (Addendum)
Brandt-Daroff exercises Brandt-Daroff exercises use gravity to help dislodge crystals from the semicircular canal. Follow these steps to try Brandt-Daroff exercises: Sit in the middle of a bed with your feet on the floor. Turn your head 45 degrees to the right. Without moving your head, lie down on your left side. Wait for the dizziness to pass, then wait 30 more seconds. If you're not dizzy, pause for 30 seconds. Return to the starting position. Pause for 30 seconds. Turn your head 45 degrees to the left. Repeat steps two and three on the right side. Return to the starting position. Pause for 30 seconds. Complete one set of five repetitions on each side. Before standing up, wait for any dizziness to pass.  Google a Video of someone teachers  Allergies - try an allergy 24 - claritin or allegra - 2 week trial

## 2022-02-27 NOTE — Assessment & Plan Note (Addendum)
Chronic and persistent. Symptoms persisting over 10 days.  Continue to be intermittent.  Handout for home exercise, prescription for meclizine provided.  Discussed physical therapy now, patient would like to wait.  Referral to ear nose and throat placed today so if symptoms do not improve he has an appointment in place.  Treat allergies.

## 2022-02-27 NOTE — Assessment & Plan Note (Signed)
Patient notes vertigo worsens in spring and fall, discussed importance of taking daily allergy medicine.  He cannot tolerate Flonase.  He will try various over-the-counter medications to find one that will work.  Try Xyzal.

## 2022-02-27 NOTE — Telephone Encounter (Signed)
See note from 02/27/22

## 2022-03-01 ENCOUNTER — Ambulatory Visit: Payer: 59 | Admitting: Cardiology

## 2022-03-01 ENCOUNTER — Encounter: Payer: Self-pay | Admitting: Cardiology

## 2022-03-01 VITALS — BP 146/82 | HR 61 | Ht 71.5 in | Wt 203.6 lb

## 2022-03-01 DIAGNOSIS — I4819 Other persistent atrial fibrillation: Secondary | ICD-10-CM | POA: Diagnosis not present

## 2022-03-01 MED ORDER — LISINOPRIL 20 MG PO TABS: 20.0000 mg | ORAL_TABLET | Freq: Every day | ORAL | 3 refills | Status: DC

## 2022-03-01 NOTE — Patient Instructions (Signed)
Medication Instructions:  Increase your Lisinopril to 20 mg daily *If you need a refill on your cardiac medications before your next appointment, please call your pharmacy*   Lab Work: BMP in two weeks  If you have labs (blood work) drawn today and your tests are completely normal, you will receive your results only by: MyChart Message (if you have MyChart) OR A paper copy in the mail If you have any lab test that is abnormal or we need to change your treatment, we will call you to review the results.   Testing/Procedures: none   Follow-Up: At [facility], you and your health needs are our priority.  As part of our continuing mission to provide you with exceptional heart care, we have created designated Provider Care Teams.  These Care Teams include your primary Cardiologist (physician) and Advanced Practice Providers (APPs -  Physician Assistants and Nurse Practitioners) who all work together to provide you with the care you need, when you need it.  We recommend signing up for the patient portal called "MyChart".  Sign up information is provided on this After Visit Summary.  MyChart is used to connect with patients for Virtual Visits (Telemedicine).  Patients are able to view lab/test results, encounter notes, upcoming appointments, etc.  Non-urgent messages can be sent to your provider as well.   To learn more about what you can do with MyChart, go to [email or web address].    Your next appointment:   6 month(s)  The format for your next appointment:   In Person  Provider:   You will see one of the following Advanced Practice Providers on your designated Care Team:   Francis Dowse, [place] Casimiro Needle "Mardelle Matte" Lanna Poche, [place]    Other Instructions None   Important Information About Sugar

## 2022-03-01 NOTE — Progress Notes (Signed)
Electrophysiology Office Follow up Visit Note:    Date:  03/01/2022   ID:  Dylan Olsen, Dylan Olsen [birth year], MRN [id number]  PCP:  Lynnda Child, MD  [facility] Cardiologist:  None  [facility] Electrophysiologist:  [name], MD    Interval History:    Dylan Olsen is a 66 y.o. male who presents for a follow up visit after an atrial fibrillation ablation on April 07, 2020.  The patient has done overall very well since his ablation but did have an episode of atrial fibrillation on December 27, 2021 that lasted 2 hours and 50 minutes with a mean ventricular rate of 107 bpm with a max ventricular rate of 176 bpm.  This was captured on his loop recorder monitor.  He was symptomatic during the episode.  He was restarted on Eliquis and is tolerating the medication without any bleeding issues.  He is in clinic today with his wife.  He is currently having some issues with vertigo and is planning to perform the Epley maneuver at home to address this.       Past Medical History:  Diagnosis Date   Complication of anesthesia    difficult to wake, stopped breathing, bagged   Pericarditis    a. 04/2020 following Afib ablation.   Persistent atrial fibrillation (HCC)    a. 04/2020 s/p PVI.    Past Surgical History:  Procedure Laterality Date   ATRIAL FIBRILLATION ABLATION N/A 04/07/2020   Procedure: ATRIAL FIBRILLATION ABLATION;  Surgeon: [name], MD;  Location: [facility] INVASIVE CV LAB;  Service: Cardiovascular;  Laterality: N/A;   BACK SURGERY     BUBBLE STUDY  04/06/2020   Procedure: BUBBLE STUDY;  Surgeon: [name], MD;  Location: [facility] ENDOSCOPY;  Service: Cardiovascular;;   FOOT SURGERY     KNEE SURGERY Left    TEE WITHOUT CARDIOVERSION N/A 04/06/2020   Procedure: TRANSESOPHAGEAL ECHOCARDIOGRAM (TEE);  Surgeon: [name], MD;  Location: [facility] ENDOSCOPY;  Service: Cardiovascular;  Laterality: N/A;    Current Medications: Current Meds   Medication Sig   apixaban (ELIQUIS) 5 MG TABS tablet Take 1 tablet (5 mg total) by mouth 2 (two) times daily.   atorvastatin (LIPITOR) 20 MG tablet Take 1 tablet (20 mg total) by mouth daily.   diazepam (VALIUM) 2 MG tablet Take 0.5-1 tablets (1-2 mg total) by mouth every 8 (eight) hours as needed (vertigo).   lisinopril (ZESTRIL) 20 MG tablet Take 1 tablet (20 mg total) by mouth daily.   meclizine (ANTIVERT) 25 MG tablet Take 1 tablet (25 mg total) by mouth 3 (three) times daily as needed for dizziness.   pantoprazole (PROTONIX) 40 MG tablet Take 40 mg by mouth daily.   sildenafil (REVATIO) 20 MG tablet Take 3-5 tablets (60-100 mg total) by mouth daily as needed (intercourse).   [DISCONTINUED] lisinopril (ZESTRIL) 10 MG tablet Take 1 tablet (10 mg total) by mouth daily.     Allergies:   Other   Social History   Socioeconomic History   Marital status: Married    Spouse name: [name]   Number of children: 2   Years of education: high school   Highest education level: Not on file  Occupational History   Occupation: Curator  Tobacco Use   Smoking status: Former    Types: Cigars   Smokeless tobacco: Never   Tobacco comments:    a few times a year  Vaping Use   Vaping Use: Never used  Substance and Sexual Activity   Alcohol use: Yes    Comment: less than monthly   Drug use: Yes    Types: Marijuana    Comment: weekend   Sexual activity: Yes    Birth control/protection: Post-menopausal  Other Topics Concern   Not on file  Social History Narrative   01/13/20   From: [place], [place] originally - moved to [place] 1996   Living: with wife [name] (1996), and step daughter (special needs)   Work: English as a second language teacher for [employer]      Family: 2 children - [name] and [place] - in [place], good relationship and 3 grandchildren and 2 step children      Enjoys: drag racing, football      Exercise: not currently - walking and mobile job   Diet: not great, sometimes a little better      Safety    Seat belts: Yes    Guns: Yes  and secure   Safe in relationships: Yes    Social Determinants of Corporate investment banker Strain: Not on file  Food Insecurity: Not on file  Transportation Needs: Not on file  Physical Activity: Not on file  Stress: Not on file  Social Connections: Not on file     Family History: The patient's family history includes Cancer in his cousin; Heart attack (age of onset: 70) in his father; Heart disease in his father.  ROS:   Please see the history of present illness.    All other systems reviewed and are negative.  EKGs/Labs/Other Studies Reviewed:    The following studies were reviewed today:  Loop recorder interrogations  EKG:  The ekg ordered today demonstrates sinus rhythm with an incomplete right bundle branch block.  Ventricular rate of 61 bpm  Recent Labs: 01/14/2022: BUN 7; Creatinine, Ser 0.86; Potassium 3.7; Sodium 143 02/12/2022: Hemoglobin 13.5; Platelets 195.0  Recent Lipid Panel    Component Value Date/Time   CHOL 196 02/12/2022 1010   TRIG 280.0 (H) 02/12/2022 1010   HDL 38.80 (L) 02/12/2022 1010   CHOLHDL 5 02/12/2022 1010   VLDL 56.0 (H) 02/12/2022 1010   LDLDIRECT 84.0 02/12/2022 1010    Physical Exam:    VS:  BP (!) 146/82   Pulse 61   Ht 5' 11.5" (1.816 m)   Wt 203 lb 9.6 oz (92.4 kg)   SpO2 97%   BMI 28.00 kg/m     Wt Readings from Last 3 Encounters:  03/01/22 203 lb 9.6 oz (92.4 kg)  02/27/22 196 lb 6 oz (89.1 kg)  02/12/22 199 lb (90.3 kg)     GEN:  Well nourished, well developed in no acute distress HEENT: Normal NECK: No JVD; No carotid bruits LYMPHATICS: No lymphadenopathy CARDIAC: RRR, no murmurs, rubs, gallops RESPIRATORY:  Clear to auscultation without rales, wheezing or rhonchi  ABDOMEN: Soft, non-tender, non-distended MUSCULOSKELETAL:  No edema; No deformity  SKIN: Warm and dry NEUROLOGIC:  Alert and oriented x 3 PSYCHIATRIC:  Normal affect        ASSESSMENT:    1. Persistent  atrial fibrillation (HCC)    PLAN:    In order of problems listed above:  #Persistent atrial fibrillation Doing very well after his atrial fibrillation ablation in October 2021.  He is largely maintain normal rhythm and has only had one episode of atrial fibrillation lasting 2 hours and 50 minutes back in June.  I did restart his Eliquis and I would recommend he continue taking this for stroke risk  mitigation.  I do not think his burden of atrial fibrillation at this point justifies repeat ablation.  I will plan to see him back in about 6 months or sooner as needed.  If he were to develop increased burden of atrial fibrillation, would favor repeat ablation.  #Vertigo Management per primary care physician.  Planning to perform Epley maneuver at home.  #Hypertension Above goal.  Uptitrate lisinopril to 20 mg by mouth once daily.  I will have him check his BMP in 2 weeks to make sure his kidney function tolerates the increased dose.  He will check his blood pressures at home 1-2 times per week and record these values.  Follow-up in 6 months or sooner as needed.     Medication Adjustments/Labs and Tests Ordered: Current medicines are reviewed at length with the patient today.  Concerns regarding medicines are outlined above.  No orders of the defined types were placed in this encounter.  Meds ordered this encounter  Medications   lisinopril (ZESTRIL) 20 MG tablet    Sig: Take 1 tablet (20 mg total) by mouth daily.    Dispense:  90 tablet    Refill:  3     Signed, Lars Mage, MD, [facility], [facility] 03/01/2022 5:03 PM    Electrophysiology [facility]

## 2022-03-04 NOTE — Addendum Note (Signed)
Addended by: Dennis Bast F on: 03/04/2022 05:59 PM   Modules accepted: Orders

## 2022-03-14 ENCOUNTER — Other Ambulatory Visit: Payer: 59

## 2022-03-25 ENCOUNTER — Ambulatory Visit (INDEPENDENT_AMBULATORY_CARE_PROVIDER_SITE_OTHER): Payer: BC Managed Care – PPO

## 2022-03-25 DIAGNOSIS — I4819 Other persistent atrial fibrillation: Secondary | ICD-10-CM | POA: Diagnosis not present

## 2022-03-27 LAB — CUP PACEART REMOTE DEVICE CHECK
Date Time Interrogation Session: 20230917231658
Implantable Pulse Generator Implant Date: 20220316

## 2022-04-09 NOTE — Progress Notes (Signed)
Carelink Summary Report / Loop Recorder 

## 2022-04-29 ENCOUNTER — Ambulatory Visit (INDEPENDENT_AMBULATORY_CARE_PROVIDER_SITE_OTHER): Payer: 59

## 2022-04-29 DIAGNOSIS — I4819 Other persistent atrial fibrillation: Secondary | ICD-10-CM | POA: Diagnosis not present

## 2022-05-01 LAB — CUP PACEART REMOTE DEVICE CHECK
Date Time Interrogation Session: 20231022231700
Implantable Pulse Generator Implant Date: 20220316

## 2022-05-29 NOTE — Progress Notes (Signed)
Carelink Summary Report / Loop Recorder 

## 2022-06-03 ENCOUNTER — Ambulatory Visit (INDEPENDENT_AMBULATORY_CARE_PROVIDER_SITE_OTHER): Payer: 59

## 2022-06-03 DIAGNOSIS — I4819 Other persistent atrial fibrillation: Secondary | ICD-10-CM

## 2022-06-04 ENCOUNTER — Ambulatory Visit: Payer: 59

## 2022-06-04 LAB — CUP PACEART REMOTE DEVICE CHECK
Date Time Interrogation Session: 20231126230611
Implantable Pulse Generator Implant Date: 20220316

## 2022-07-02 ENCOUNTER — Ambulatory Visit (INDEPENDENT_AMBULATORY_CARE_PROVIDER_SITE_OTHER): Payer: 59

## 2022-07-02 DIAGNOSIS — I4819 Other persistent atrial fibrillation: Secondary | ICD-10-CM

## 2022-07-02 LAB — CUP PACEART REMOTE DEVICE CHECK
Date Time Interrogation Session: 20231225231717
Implantable Pulse Generator Implant Date: 20220316

## 2022-07-16 NOTE — Progress Notes (Signed)
Carelink Summary Report / Loop Recorder 

## 2022-07-25 NOTE — Progress Notes (Signed)
Carelink Summary Report / Loop Recorder

## 2022-08-05 ENCOUNTER — Ambulatory Visit: Payer: Self-pay

## 2022-08-05 DIAGNOSIS — I4819 Other persistent atrial fibrillation: Secondary | ICD-10-CM

## 2022-08-06 LAB — CUP PACEART REMOTE DEVICE CHECK
Date Time Interrogation Session: 20240127231009
Implantable Pulse Generator Implant Date: 20220316

## 2022-08-21 ENCOUNTER — Encounter: Payer: Self-pay | Admitting: Family Medicine

## 2022-08-21 ENCOUNTER — Ambulatory Visit: Payer: Self-pay | Admitting: Family Medicine

## 2022-08-21 VITALS — BP 158/88 | HR 68 | Temp 98.3°F | Ht 71.5 in | Wt 198.1 lb

## 2022-08-21 DIAGNOSIS — H8110 Benign paroxysmal vertigo, unspecified ear: Secondary | ICD-10-CM

## 2022-08-21 DIAGNOSIS — E782 Mixed hyperlipidemia: Secondary | ICD-10-CM

## 2022-08-21 DIAGNOSIS — I10 Essential (primary) hypertension: Secondary | ICD-10-CM

## 2022-08-21 DIAGNOSIS — I4819 Other persistent atrial fibrillation: Secondary | ICD-10-CM

## 2022-08-21 MED ORDER — HYDROCHLOROTHIAZIDE 12.5 MG PO TABS: 12.5000 mg | ORAL_TABLET | Freq: Every day | ORAL | 1 refills | Status: DC

## 2022-08-21 NOTE — Progress Notes (Unsigned)
    Maxmillian Carsey T. Morley Gaumer, MD, [place] at [facility] [place], [address]  Phone: [phone]  FAX: [place] - 67 y.o. male  MRN [id number]  Date of Birth: 1955-12-01  Date: 08/21/2022  PCP: Waunita Schooner, MD  Referral: Waunita Schooner, MD  Chief Complaint  Patient presents with   Blood Pressure Check   Headache   Dizziness   Subjective:   JABRON WEESE Delford Field is a 67 y.o. very pleasant male patient with Body mass index is 27.25 kg/m. who presents with the following:  TOC visit with me  Blood pressure is up and down.  Headaches.  150's/90's on his home blood pressure cuff.   Sometimes it will be normal.    Vertigo - has it pretty much all the night.  15 years, and it has gotten worse.  Happens all the time for 15 years.   A. Fib on Eliquiris - regular right now  Htn  Lipids    Review of Systems is noted in the HPI, as appropriate  Objective:   BP 133/79 Comment: Patient's wrist monitor  Pulse 68   Temp 98.3 F (36.8 C) (Temporal)   Ht 5' 11.5" (1.816 m)   Wt 198 lb 2 oz (89.9 kg)   SpO2 98%   BMI 27.25 kg/m   GEN: No acute distress; alert,appropriate. PULM: Breathing comfortably in no respiratory distress PSYCH: Normally interactive.   Laboratory and Imaging Data:  Assessment and Plan:   ***

## 2022-08-30 ENCOUNTER — Other Ambulatory Visit: Payer: Self-pay | Admitting: *Deleted

## 2022-08-30 DIAGNOSIS — E782 Mixed hyperlipidemia: Secondary | ICD-10-CM

## 2022-08-30 MED ORDER — ATORVASTATIN CALCIUM 20 MG PO TABS: 20.0000 mg | ORAL_TABLET | Freq: Every day | ORAL | 1 refills | Status: DC

## 2022-09-09 ENCOUNTER — Ambulatory Visit (INDEPENDENT_AMBULATORY_CARE_PROVIDER_SITE_OTHER): Payer: 59

## 2022-09-09 DIAGNOSIS — I4819 Other persistent atrial fibrillation: Secondary | ICD-10-CM

## 2022-09-10 LAB — CUP PACEART REMOTE DEVICE CHECK
Date Time Interrogation Session: 20240229231314
Implantable Pulse Generator Implant Date: 20220316

## 2022-09-20 NOTE — Progress Notes (Signed)
Carelink Summary Report / Loop Recorder 

## 2022-10-09 ENCOUNTER — Ambulatory Visit: Payer: BC Managed Care – PPO | Attending: Cardiology | Admitting: Cardiology

## 2022-10-09 ENCOUNTER — Encounter: Payer: Self-pay | Admitting: Cardiology

## 2022-10-09 VITALS — BP 148/76 | HR 67 | Ht 72.0 in | Wt 198.0 lb

## 2022-10-09 DIAGNOSIS — I4819 Other persistent atrial fibrillation: Secondary | ICD-10-CM

## 2022-10-09 DIAGNOSIS — I10 Essential (primary) hypertension: Secondary | ICD-10-CM

## 2022-10-09 MED ORDER — LISINOPRIL 40 MG PO TABS: 40.0000 mg | ORAL_TABLET | Freq: Every day | ORAL | 3 refills | Status: DC

## 2022-10-09 NOTE — Progress Notes (Deleted)
  Electrophysiology Office Follow up Visit Note:    Date:  10/09/2022   ID:  Dylan, Olsen March 06, 1956, MRN [id number]  PCP:  Owens Loffler, MD  [facility] Cardiologist:  None  [facility] Electrophysiologist:  Vickie Epley, MD    Interval History:    Dylan Olsen is a 67 y.o. male who presents for a follow up visit.   I last saw him March 01, 2022.  He has a prior ablation on April 07, 2020.  He had an episode of A-fib on December 27, 2021 that prompted him to restart his Eliquis.  When I saw him in August he was tolerating the blood thinner without any bleeding issues.  We planned to touch base today to revisit his atrial fibrillation burden       Past medical, surgical, social and family history were reviewed.  ROS:   Please see the history of present illness.    All other systems reviewed and are negative.  EKGs/Labs/Other Studies Reviewed:    The following studies were reviewed today:  EKG:  The ekg ordered today demonstrates ***   Physical Exam:    VS:  There were no vitals taken for this visit.    Wt Readings from Last 3 Encounters:  08/21/22 198 lb 2 oz (89.9 kg)  03/01/22 203 lb 9.6 oz (92.4 kg)  02/27/22 196 lb 6 oz (89.1 kg)     GEN: *** Well nourished, well developed in no acute distress CARDIAC: ***RRR, no murmurs, rubs, gallops RESPIRATORY:  Clear to auscultation without rales, wheezing or rhonchi       ASSESSMENT:    1. Persistent atrial fibrillation   2. Primary hypertension    PLAN:    In order of problems listed above:   #Persistent atrial fibrillation Loop recorder monitoring shows a very low burden of atrial fibrillation.  For now, I would recommend continuing to monitor using the loop recorder.  He will continue his Eliquis for stroke prophylaxis.  #Hypertension *** goal today.  Recommend checking blood pressures 1-2 times per week at home and recording the values.  Recommend bringing these recordings to the  primary care physician.   Follow-up 1 year with APP.    Signed, Lars Mage, MD, [facility], [facility] 10/09/2022 8:14 AM    Electrophysiology [facility]

## 2022-10-09 NOTE — Patient Instructions (Signed)
Medication Instructions:  INCREASE Lisinopril to 40mg  daily   *If you need a refill on your cardiac medications before your next appointment, please call your pharmacy*   Follow-Up: At [facility], you and your health needs are our priority.  As part of our continuing mission to provide you with exceptional heart care, we have created designated Provider Care Teams.  These Care Teams include your primary Cardiologist (physician) and Advanced Practice Providers (APPs -  Physician Assistants and Nurse Practitioners) who all work together to provide you with the care you need, when you need it.  We recommend signing up for the patient portal called "MyChart".  Sign up information is provided on this After Visit Summary.  MyChart is used to connect with patients for Virtual Visits (Telemedicine).  Patients are able to view lab/test results, encounter notes, upcoming appointments, etc.  Non-urgent messages can be sent to your provider as well.   To learn more about what you can do with MyChart, go to [email or web address].    Your next appointment:   1 year(s)  Provider:   You will see one of the following Advanced Practice Providers on your designated Care Team:   Tommye Standard, [place] Legrand Como "Jonni Sanger" Chalmers Cater, [place]  Other Instructions Follow up with your primary care doctor in 4 weeks

## 2022-10-09 NOTE — Progress Notes (Signed)
Electrophysiology Office Follow up Visit Note:    Date:  10/09/2022   ID:  Dylan Olsen, Dylan Olsen 1955-07-27, MRN [id number]  PCP:  Owens Loffler, MD  [facility] Cardiologist:  None  [facility] Electrophysiologist:  Vickie Epley, MD    Interval History:    Dylan Olsen is a 67 y.o. male who presents for a follow up visit.   I last saw him March 01, 2022.  He has a prior ablation on April 07, 2020.  He had an episode of A-fib on December 27, 2021 that prompted him to restart his Eliquis.  When I saw him in August he was tolerating the blood thinner without any bleeding issues.  We planned to touch base today to revisit his atrial fibrillation burden.  Today, he is accompanied by a family member. He complains of becoming short winded every once in a while; one day this was severe.   Typically he is active while at work and walks at a brisk pace. He denies anginal symptoms or chest pain.   In clinic today his blood pressure is 148/76. Sometimes he will monitor his BP at home. He is mostly compliant with Eliquis. He reports that recently he went back to taking Eliquis twice daily.  Of note, he was unable to tolerate HCTZ. He "felt off" while taking it for 2 weeks. His symptoms resolved after he stopped the HCTZ.  He denies any chest pain, or peripheral edema. No lightheadedness, headaches, syncope, orthopnea, or PND.      Past medical, surgical, social and family history were reviewed.  ROS:   Please see the history of present illness.    (+) Shortness of breath (+) Palpitations All other systems reviewed and are negative.  EKGs/Labs/Other Studies Reviewed:    The following studies were reviewed today:  EKG:  The ekg ordered today demonstrates sinus with incomplete RBBB   Physical Exam:    VS:  BP (!) 148/76   Pulse 67   Ht 6' (1.829 m)   Wt 198 lb (89.8 kg)   SpO2 97%   BMI 26.85 kg/m     Wt Readings from Last 3 Encounters:  10/09/22 198 lb (89.8  kg)  08/21/22 198 lb 2 oz (89.9 kg)  03/01/22 203 lb 9.6 oz (92.4 kg)     GEN: Well nourished, well developed in no acute distress CARDIAC: RRR, no murmurs, rubs, gallops.  ILR pocket well healed. RESPIRATORY:  Clear to auscultation without rales, wheezing or rhonchi       ASSESSMENT:    1. Persistent atrial fibrillation   2. Primary hypertension    PLAN:    In order of problems listed above:   #Persistent atrial fibrillation Loop recorder monitoring shows a very low burden of atrial fibrillation.  No AF in the last 3+ months. He would like to discontinue his anticoagulation if at all possible.  He will stop his Eliquis and start Aspirin 81mg  PO daily with food.   #Hypertension Above goal today.  Increase lisinopril to 40mg  PO daily. He will follow up with his PCP in the next 2-4 weeks.  I will have him check his BMP and Mag in 1-2 weeks. Recommend checking blood pressures 1-2 times per week at home and recording the values.  Recommend bringing these recordings to the primary care physician.   Follow-up 1 year with APP.  I,Mathew Stumpf,acting as a Education administrator for Vickie Epley, MD.,have documented all relevant documentation on the behalf  of Vickie Epley, MD,as directed by  Vickie Epley, MD while in the presence of Vickie Epley, MD.  I, Vickie Epley, MD, have reviewed all documentation for this visit. The documentation on 10/09/22 for the exam, diagnosis, procedures, and orders are all accurate and complete.  Signed, Lars Mage, MD, [facility], [facility] 10/09/2022 7:03 PM    Electrophysiology [facility]

## 2022-10-10 NOTE — Addendum Note (Signed)
Addended by: Bernestine Amass on: 10/10/2022 07:23 AM   Modules accepted: Orders

## 2022-10-14 ENCOUNTER — Ambulatory Visit (INDEPENDENT_AMBULATORY_CARE_PROVIDER_SITE_OTHER): Payer: BC Managed Care – PPO

## 2022-10-14 DIAGNOSIS — I4819 Other persistent atrial fibrillation: Secondary | ICD-10-CM | POA: Diagnosis not present

## 2022-10-14 LAB — CUP PACEART REMOTE DEVICE CHECK
Date Time Interrogation Session: 20240407231537
Implantable Pulse Generator Implant Date: 20220316

## 2022-10-21 NOTE — Progress Notes (Signed)
Carelink Summary Report / Loop Recorder 

## 2022-11-07 ENCOUNTER — Other Ambulatory Visit
Admission: RE | Admit: 2022-11-07 | Discharge: 2022-11-07 | Disposition: A | Discharge status: Home / Self Care | Payer: BC Managed Care – PPO | Attending: Cardiology | Admitting: Cardiology

## 2022-11-07 DIAGNOSIS — I10 Essential (primary) hypertension: Secondary | ICD-10-CM | POA: Insufficient documentation

## 2022-11-07 DIAGNOSIS — I4819 Other persistent atrial fibrillation: Secondary | ICD-10-CM | POA: Insufficient documentation

## 2022-11-07 LAB — BASIC METABOLIC PANEL
Anion gap: 8 (ref 5–15)
BUN: 11 mg/dL (ref 8–23)
CO2: 28 mmol/L (ref 22–32)
Calcium: 9.4 mg/dL (ref 8.9–10.3)
Chloride: 104 mmol/L (ref 98–111)
Creatinine, Ser: 0.84 mg/dL (ref 0.61–1.24)
GFR, Estimated: >60 mL/min (ref >60)
Glucose, Bld: 104 mg/dL — ABNORMAL HIGH (ref 70–99)
Potassium: 4 mmol/L (ref 3.5–5.1)
Sodium: 140 mmol/L (ref 135–145)

## 2022-11-07 LAB — MAGNESIUM: Magnesium: 2.2 mg/dL (ref 1.7–2.4)

## 2022-11-18 ENCOUNTER — Ambulatory Visit (INDEPENDENT_AMBULATORY_CARE_PROVIDER_SITE_OTHER): Payer: BC Managed Care – PPO

## 2022-11-18 DIAGNOSIS — I4819 Other persistent atrial fibrillation: Secondary | ICD-10-CM | POA: Diagnosis not present

## 2022-11-19 LAB — CUP PACEART REMOTE DEVICE CHECK
Date Time Interrogation Session: 20240510230907
Implantable Pulse Generator Implant Date: 20220316

## 2022-11-21 NOTE — Progress Notes (Signed)
Carelink Summary Report / Loop Recorder 

## 2022-12-16 NOTE — Progress Notes (Signed)
Carelink Summary Report / Loop Recorder 

## 2022-12-19 LAB — CUP PACEART REMOTE DEVICE CHECK
Date Time Interrogation Session: 20240612231118
Implantable Pulse Generator Implant Date: 20220316

## 2022-12-20 ENCOUNTER — Ambulatory Visit (INDEPENDENT_AMBULATORY_CARE_PROVIDER_SITE_OTHER): Payer: BC Managed Care – PPO

## 2022-12-20 DIAGNOSIS — I4819 Other persistent atrial fibrillation: Secondary | ICD-10-CM

## 2023-01-07 NOTE — Progress Notes (Signed)
Carelink Summary Report / Loop Recorder 

## 2023-01-22 ENCOUNTER — Ambulatory Visit (INDEPENDENT_AMBULATORY_CARE_PROVIDER_SITE_OTHER): Payer: BC Managed Care – PPO

## 2023-01-22 DIAGNOSIS — I4819 Other persistent atrial fibrillation: Secondary | ICD-10-CM

## 2023-01-22 LAB — CUP PACEART REMOTE DEVICE CHECK
Date Time Interrogation Session: 20240715231218
Implantable Pulse Generator Implant Date: 20220316

## 2023-02-05 ENCOUNTER — Encounter (INDEPENDENT_AMBULATORY_CARE_PROVIDER_SITE_OTHER): Payer: Self-pay

## 2023-02-06 NOTE — Progress Notes (Signed)
Carelink Summary Report / Loop Recorder 

## 2023-02-19 ENCOUNTER — Ambulatory Visit (INDEPENDENT_AMBULATORY_CARE_PROVIDER_SITE_OTHER): Payer: BC Managed Care – PPO | Admitting: Family Medicine

## 2023-02-19 ENCOUNTER — Encounter: Payer: Self-pay | Admitting: Family Medicine

## 2023-02-19 VITALS — BP 138/70 | HR 59 | Temp 97.7°F | Ht 70.0 in | Wt 191.2 lb

## 2023-02-19 DIAGNOSIS — R195 Other fecal abnormalities: Secondary | ICD-10-CM | POA: Diagnosis not present

## 2023-02-19 DIAGNOSIS — Z0000 Encounter for general adult medical examination without abnormal findings: Secondary | ICD-10-CM | POA: Diagnosis not present

## 2023-02-19 DIAGNOSIS — Z1211 Encounter for screening for malignant neoplasm of colon: Secondary | ICD-10-CM

## 2023-02-19 NOTE — Progress Notes (Unsigned)
Dylan Montini T. Martise [name], MD, CAQ Sports Medicine [facility] [address] [place], [address]  Phone: [phone]  FAX: [phone]  [name] - 67 y.o. male  MRN [id number]  Date of Birth: 04/30/1956  Date: 02/19/2023  PCP: [name], MD  Referral: [name], MD  Chief Complaint  Patient presents with   Annual Exam   Patient Care Team: [name], MD as PCP - General (Family Medicine) [name], MD as PCP - Electrophysiology (Cardiology) [name], MD as Consulting Physician (Neurosurgery) Subjective:   Dylan Olsen [name] is a 67 y.o. pleasant patient who presents with the following:  Preventative Health Maintenance Visit:  Health Maintenance Summary Reviewed and updated, unless pt declines services.  Tobacco History Reviewed. Alcohol: No concerns, no excessive use Exercise Habits: Some activity, rec at least 30 mins 5 times a week - walks some at work, does not count steps STD concerns: no risk or activity to increase risk Drug Use: some MJ  Colon - IFOB + 2022, referred to GI, but he never went.  He was referred to GI in 2022 and 2023  Wt Readings from Last 3 Encounters:  02/19/23 191 lb 4 oz (86.8 kg)  10/09/22 198 lb (89.8 kg)  08/21/22 198 lb 2 oz (89.9 kg)    From [place]  Shingrix - no Covid booster - no Never gets a flu shot Declines Pneumonia vaccine  A. Fib s/p ablation No rate controlling medication  Multiple relatives died recently from cancer  Chronic vertigo - 15 years  Mud-like bowl movements, sometimes normal consistency- try fiber  Lipids: Doing well, stable. Tolerating meds fine with no SE. Panel reviewed with patient.  Lipids: Lab Results  Component Value Date   CHOL 196 02/12/2022   Lab Results  Component Value Date   HDL 38.80 (L) 02/12/2022   No results found for: "LDLCALC" Lab Results  Component Value Date   TRIG 280.0 (H) 02/12/2022    Lab Results  Component Value Date   CHOLHDL 5 02/12/2022    Lab Results  Component Value Date   ALT 62 (H) 01/09/2021   AST 38 (H) 01/09/2021   ALKPHOS 84 01/09/2021   BILITOT 0.6 01/09/2021     Health Maintenance  Topic Date Due   Colonoscopy  Never done   COVID-19 Vaccine (4 - 2023-24 season) 03/08/2022   Zoster Vaccines- Shingrix (1 of 2) 05/22/2023 (Originally 11/24/2005)   Pneumonia Vaccine 72+ Years old (1 of 2 - PCV) 08/22/2023 (Originally 11/24/1961)   INFLUENZA VACCINE  10/06/2023 (Originally 02/06/2023)   HPV VACCINES  Aged Out   DTaP/Tdap/Td  Discontinued   Hepatitis C Screening  Discontinued   Immunization History  Administered Date(s) Administered   PFIZER(Purple Top)SARS-COV-2 Vaccination 01/08/2020, 01/29/2020, 01/27/2021   Patient Active Problem List   Diagnosis Date Noted   Persistent atrial fibrillation (HCC) 01/13/2020    Priority: High   Chronic vertigo 02/20/2023    Priority: Medium    Primary hypertension 02/06/2021    Priority: Medium    Mixed hyperlipidemia 04/09/2020    Priority: Medium    Cigar smoker 02/06/2021    Priority: Low   GERD (gastroesophageal reflux disease) 01/13/2020    Priority: Low   Erectile dysfunction 01/13/2020    Priority: Low   Positive fecal occult blood test 02/06/2021   Seasonal allergies 01/13/2020    Past Medical History:  Diagnosis Date   Chronic vertigo 02/20/2023  Complication of anesthesia    difficult to wake, stopped breathing, bagged   Mixed hyperlipidemia 04/09/2020   Pericarditis    a. 04/2020 following Afib ablation.   Persistent atrial fibrillation (HCC)    a. 04/2020 s/p PVI.   Primary hypertension 02/06/2021    Past Surgical History:  Procedure Laterality Date   ATRIAL FIBRILLATION ABLATION N/A 04/07/2020   Procedure: ATRIAL FIBRILLATION ABLATION;  Surgeon: [name], MD;  Location: [facility] INVASIVE CV LAB;  Service: Cardiovascular;  Laterality: N/A;   BACK SURGERY     BUBBLE STUDY   04/06/2020   Procedure: BUBBLE STUDY;  Surgeon: [name], MD;  Location: [facility] ENDOSCOPY;  Service: Cardiovascular;;   FOOT SURGERY     KNEE SURGERY Left    TEE WITHOUT CARDIOVERSION N/A 04/06/2020   Procedure: TRANSESOPHAGEAL ECHOCARDIOGRAM (TEE);  Surgeon: [name], MD;  [facility]COPY;  Service: Cardiovascular;  Laterality: N/A;    Family History  Problem Relation Age of Onset   Heart disease Father    Heart attack Father 24   Cancer Cousin        unknown primary    Social History   Social History Narrative   01/13/20   [place] - moved to [place] 1996   Living: with wife Dylan O[place] (1996), and step daughter (special needs)   Work: English as a second language teacher for [employer]      Family: 2 children - [place] O[place] and [place] - in [place], good relationship and 3 grandchildren and 2 step children      Enjoys: drag racing, football      Exercise: not currently - walking and mobile job   Diet: not great, sometimes a little better      Safety   Seat belts: Yes    Guns: Yes  and secure   Safe in relationships: Yes     Past Medical History, Surgical History, Social History, Family History, Problem List, Medications, and Allergies have been reviewed and updated if relevant.  Review of Systems: Pertinent positives are listed above.  Otherwise, a full 14 point review of systems has been done in full and it is negative except where it is noted positive.  Objective:   BP 138/70 (BP Location: Left Arm, Patient Position: Sitting, Cuff Size: Large)   Pulse (!) 59   Temp 97.7 F (36.5 C) (Temporal)   Ht 5\' 10"  (1.778 m)   Wt 191 lb 4 oz (86.8 kg)   SpO2 98%   BMI 27.44 kg/m  Ideal Body Weight: Weight in (lb) to have BMI = 25: 173.9  Ideal Body Weight: Weight in (lb) to have BMI = 25: 173.9 No results found.    02/19/2023    3:58 PM 02/12/2022    9:23 AM 02/06/2021    3:44 PM 01/13/2020    4:28 PM  Depression screen PHQ 2/9  Decreased Interest 0 0 0 0   Down, Depressed, Hopeless 0 0 0 0  PHQ - 2 Score 0 0 0 0     GEN: well developed, well nourished, no acute distress Eyes: conjunctiva and lids normal, PERRLA, EOMI ENT: TM clear, nares clear, oral exam WNL Neck: supple, no lymphadenopathy, no thyromegaly, no JVD Pulm: clear to auscultation and percussion, respiratory effort normal CV: regular rate and rhythm, S1-S2, no murmur, rub or gallop, no bruits, peripheral pulses normal and symmetric, no cyanosis, clubbing, edema or varicosities GI: soft, non-tender; no hepatosplenomegaly, masses; active bowel sounds all quadrants GU: deferred Lymph: no  cervical, axillary or inguinal adenopathy MSK: gait normal, muscle tone and strength WNL, no joint swelling, effusions, discoloration, crepitus  SKIN: clear, good turgor, color WNL, no rashes, lesions, or ulcerations Neuro: normal mental status, normal strength, sensation, and motion Psych: alert; oriented to person, place and time, normally interactive and not anxious or depressed in appearance.  All labs reviewed with patient. Declines all labwork  Assessment and Plan:     ICD-10-CM   1. Healthcare maintenance  Z00.00     2. Heme positive stool  R19.5 Ambulatory referral to Gastroenterology    3. Screen for colon cancer  Z12.11 Ambulatory referral to Gastroenterology     He is overall doing okay.  He declines all vaccination today.  He declines any lab work today.  He understands that by declining all vaccination, health maintenance, and lab work he could be missing some very significant health problems which ultimately could lead to a poor outcome and possibly death.  After long discussion, he is open to doing colonoscopy, given his need for screening as well as his positive I fob test 2 years ago.  Health Maintenance Exam: The patient's preventative maintenance and recommended screening tests for an annual wellness exam were reviewed in full today. Brought up to date unless  services declined.  Counselled on the importance of diet, exercise, and its role in overall health and mortality. The patient's FH and SH was reviewed, including their home life, tobacco status, and drug and alcohol status.  Follow-up in 1 year for physical exam or additional follow-up below.  Disposition: No follow-ups on file.  No orders of the defined types were placed in this encounter.  Medications Discontinued During This Encounter  Medication Reason   apixaban (ELIQUIS) 5 MG TABS tablet Completed Course   Orders Placed This Encounter  Procedures   Ambulatory referral to Gastroenterology    Signed,  Karleen Hampshire T. Niyana Chesbro, MD   Allergies as of 02/19/2023       Reactions   Other Other (See Comments)   Pt reports he "coded after having anesthesia with a back surgery"  Can not recall what medicine        Medication List        Accurate as of February 19, 2023 11:59 PM. If you have any questions, ask your nurse or doctor.          STOP taking these medications    apixaban 5 MG Tabs tablet Commonly known as: ELIQUIS Stopped by: Karleen Hampshire Caliann Leckrone       TAKE these medications    acetaminophen 500 MG tablet Commonly known as: TYLENOL Take 500 mg by mouth every 6 (six) hours as needed.   Advil 200 MG tablet Generic drug: ibuprofen Take 200 mg by mouth every 6 (six) hours as needed.   aspirin EC 81 MG tablet Take 81 mg by mouth daily. Swallow whole.   atorvastatin 20 MG tablet Commonly known as: LIPITOR Take 1 tablet (20 mg total) by mouth daily.   cetirizine 10 MG tablet Commonly known as: ZYRTEC Take 10 mg by mouth daily.   famotidine 20 MG tablet Commonly known as: PEPCID Take 20 mg by mouth daily as needed for heartburn or indigestion.   lisinopril 40 MG tablet Commonly known as: ZESTRIL Take 1 tablet (40 mg total) by mouth daily.   meclizine 25 MG tablet Commonly known as: ANTIVERT Take 1 tablet (25 mg total) by mouth 3 (three) times daily  as needed for dizziness.   PROBIOTIC  PO Take 1 tablet by mouth daily. Nature Bounty Acidophilus Probiotic   sildenafil 20 MG tablet Commonly known as: REVATIO Take 3-5 tablets (60-100 mg total) by mouth daily as needed (intercourse).

## 2023-02-20 ENCOUNTER — Encounter: Payer: Self-pay | Admitting: Family Medicine

## 2023-02-20 DIAGNOSIS — R42 Dizziness and giddiness: Secondary | ICD-10-CM | POA: Insufficient documentation

## 2023-02-20 HISTORY — DX: Dizziness and giddiness: R42

## 2023-02-24 ENCOUNTER — Ambulatory Visit (INDEPENDENT_AMBULATORY_CARE_PROVIDER_SITE_OTHER): Payer: BC Managed Care – PPO

## 2023-02-24 DIAGNOSIS — I4819 Other persistent atrial fibrillation: Secondary | ICD-10-CM | POA: Diagnosis not present

## 2023-02-24 LAB — CUP PACEART REMOTE DEVICE CHECK
Date Time Interrogation Session: 20240817230809
Implantable Pulse Generator Implant Date: 20220316

## 2023-03-07 NOTE — Progress Notes (Signed)
Carelink Summary Report / Loop Recorder 

## 2023-03-11 ENCOUNTER — Encounter: Payer: Self-pay | Admitting: Cardiology

## 2023-03-25 ENCOUNTER — Encounter: Payer: Self-pay | Admitting: Nurse Practitioner

## 2023-03-25 ENCOUNTER — Telehealth: Payer: BC Managed Care – PPO | Admitting: Nurse Practitioner

## 2023-03-25 VITALS — Ht 70.0 in

## 2023-03-25 DIAGNOSIS — J329 Chronic sinusitis, unspecified: Secondary | ICD-10-CM | POA: Diagnosis not present

## 2023-03-25 DIAGNOSIS — U071 COVID-19: Secondary | ICD-10-CM | POA: Diagnosis not present

## 2023-03-25 DIAGNOSIS — R051 Acute cough: Secondary | ICD-10-CM | POA: Diagnosis not present

## 2023-03-25 MED ORDER — BENZONATATE 200 MG PO CAPS
200.0000 mg | ORAL_CAPSULE | Freq: Three times a day (TID) | ORAL | 0 refills | Status: AC | PRN
Start: 2023-03-25 — End: ?

## 2023-03-25 MED ORDER — AMOXICILLIN-POT CLAVULANATE 875-125 MG PO TABS
1.0000 | ORAL_TABLET | Freq: Two times a day (BID) | ORAL | 0 refills | Status: AC
Start: 2023-03-25 — End: 2023-04-01

## 2023-03-25 NOTE — Assessment & Plan Note (Signed)
Given description of symptoms having COVID 10 days ago do think patient has a secondary infection of sinusitis may be possible lower respiratory infection.  Will treat patient with Augmentin 875-125 mg twice daily.  Strict instruction to follow-up in person if no improvement

## 2023-03-25 NOTE — Progress Notes (Signed)
Ph: [phone] Fax: [phone]   Patient ID: Dylan Olsen, male    DOB: 04-03-1956, 67 y.o.   MRN: [id number]  Virtual visit completed through MyChart, a video enabled telemedicine application. Due to national recommendations of social distancing due to COVID-19, a virtual visit is felt to be most appropriate for this patient at this time. Reviewed limitations, risks, security and privacy concerns of performing a virtual visit and the availability of in person appointments. I also reviewed that there may be a patient responsible charge related to this service. The patient agreed to proceed.   Patient location: home Provider location: [place] at [facility], office Persons participating in this virtual visit: patient, provider, spouse  If any vitals were documented, they were collected by patient at home unless specified below.    Ht 5\' 10"  (1.778 m)   BMI 27.44 kg/m    CC: covid 19 Subjective:   HPI: Dylan Olsen is a 67 y.o. male presenting presenting on 03/25/2023 for Covid Positive (Headache, stomach burning sensation. Pt states he just feels bad. Tested pos on Saturday. )    Symptoms started on 10 days ago 03/15/2023 Tested positive on 03/15/2023 Covid vaccine: 2 plus a booster   States that he was around a Cabin crew that was sick but not sure if he had covid   Tylenol, advil, mucinex severe cold. States that he feels a little better     Relevant past medical, surgical, family and social history reviewed and updated as indicated. Interim medical history since our last visit reviewed. Allergies and medications reviewed and updated. Outpatient Medications Prior to Visit  Medication Sig Dispense Refill   acetaminophen (TYLENOL) 500 MG tablet Take 500 mg by mouth every 6 (six) hours as needed.     aspirin EC 81 MG tablet Take 81 mg by mouth daily. Swallow whole.     atorvastatin (LIPITOR) 20 MG tablet Take 1 tablet (20 mg total) by mouth daily. 90 tablet 1   cetirizine  (ZYRTEC) 10 MG tablet Take 10 mg by mouth daily.     ibuprofen (ADVIL) 200 MG tablet Take 200 mg by mouth every 6 (six) hours as needed.     lisinopril (ZESTRIL) 40 MG tablet Take 1 tablet (40 mg total) by mouth daily. 90 tablet 3   meclizine (ANTIVERT) 25 MG tablet Take 1 tablet (25 mg total) by mouth 3 (three) times daily as needed for dizziness. 30 tablet 0   Probiotic Product (PROBIOTIC PO) Take 1 tablet by mouth daily. Nature Bounty Acidophilus Probiotic     famotidine (PEPCID) 20 MG tablet Take 20 mg by mouth daily as needed for heartburn or indigestion. (Patient not taking: Reported on 03/25/2023)     sildenafil (REVATIO) 20 MG tablet Take 3-5 tablets (60-100 mg total) by mouth daily as needed (intercourse). (Patient not taking: Reported on 03/25/2023) 30 tablet 1   No facility-administered medications prior to visit.     Per HPI unless specifically indicated in ROS section below Review of Systems  Constitutional:  Positive for appetite change, fatigue and fever (subjective). Negative for chills.  HENT:  Positive for sinus pressure and sinus pain. Negative for ear discharge, ear pain and sore throat.   Respiratory:  Positive for cough and shortness of breath (doe).   Gastrointestinal:  Positive for abdominal pain (burning sensation). Negative for constipation, diarrhea and nausea.  Neurological:  Positive for headaches.   Objective:  Ht 5\' 10"  (1.778 m)   BMI 27.44  kg/m   Wt Readings from Last 3 Encounters:  02/19/23 191 lb 4 oz (86.8 kg)  10/09/22 198 lb (89.8 kg)  08/21/22 198 lb 2 oz (89.9 kg)       Physical exam: Gen: alert, NAD, not ill appearing Pulm: speaks in complete sentences without increased work of breathing Psych: normal mood, normal thought content      Results for orders placed or performed in visit on 02/24/23  CUP PACEART REMOTE DEVICE CHECK  Result Value Ref Range   Date Time Interrogation Session [phone]    Pulse Generator Manufacturer MERM     Pulse Gen Model LNQ22 LINQ II    Pulse Gen Serial Number [id number]    Clinic Name [facility]    Implantable Pulse Generator Type ICM/ILR    Implantable Pulse Generator Implant Date [id number]    Assessment & Plan:   COVID-19 Assessment & Plan: Patient tested positive for COVID approximately 10 days ago.  He is outside the window of antiviral treatment.   Sinusitis, unspecified chronicity, unspecified location Assessment & Plan: Given description of symptoms having COVID 10 days ago do think patient has a secondary infection of sinusitis may be possible lower respiratory infection.  Will treat patient with Augmentin 875-125 mg twice daily.  Strict instruction to follow-up in person if no improvement  Orders: -     Amoxicillin-Pot Clavulanate; Take 1 tablet by mouth 2 (two) times daily for 7 days.  Dispense: 14 tablet; Refill: 0  Acute cough Assessment & Plan: Will treat patient with Tessalon Perles 200 mg 3 times daily as needed  Orders: -     Benzonatate; Take 1 capsule (200 mg total) by mouth 3 (three) times daily as needed for cough.  Dispense: 21 capsule; Refill: 0     I discussed the assessment and treatment plan with the patient. The patient was provided an opportunity to ask questions and all were answered. The patient agreed with the plan and demonstrated an understanding of the instructions. The patient was advised to call back or seek an in-person evaluation if the symptoms worsen or if the condition fails to improve as anticipated.  Follow up plan: Return if symptoms worsen or fail to improve.  Audria Nine, NP

## 2023-03-25 NOTE — Assessment & Plan Note (Signed)
Will treat patient with Tessalon Perles 200 mg 3 times daily as needed

## 2023-03-25 NOTE — Patient Instructions (Signed)
Nice to see you today I have sent in an antibiotic and cough pills to the pharmacy  Follow up if you do not start improving in the next 2-3 days.  We will need to see you in person

## 2023-03-25 NOTE — Assessment & Plan Note (Signed)
Patient tested positive for COVID approximately 10 days ago.  He is outside the window of antiviral treatment.

## 2023-03-26 ENCOUNTER — Encounter: Payer: Self-pay | Admitting: Nurse Practitioner

## 2023-03-27 NOTE — Telephone Encounter (Signed)
Called and spoke to patient. He has been set up for visit tomorrow.

## 2023-03-28 ENCOUNTER — Encounter: Payer: Self-pay | Admitting: Family

## 2023-03-28 ENCOUNTER — Ambulatory Visit: Payer: BC Managed Care – PPO | Admitting: Family

## 2023-03-28 VITALS — BP 168/92 | HR 65 | Temp 97.3°F | Ht 70.0 in | Wt 189.0 lb

## 2023-03-28 DIAGNOSIS — U071 COVID-19: Secondary | ICD-10-CM

## 2023-03-28 DIAGNOSIS — R5383 Other fatigue: Secondary | ICD-10-CM | POA: Diagnosis not present

## 2023-03-28 LAB — COMPREHENSIVE METABOLIC PANEL
ALT: 20 U/L (ref 0–53)
AST: 19 U/L (ref 0–37)
Albumin: 4.5 g/dL (ref 3.5–5.2)
Alkaline Phosphatase: 93 U/L (ref 39–117)
BUN: 13 mg/dL (ref 6–23)
CO2: 28 mEq/L (ref 19–32)
Calcium: 9.3 mg/dL (ref 8.4–10.5)
Chloride: 101 mEq/L (ref 96–112)
Creatinine, Ser: 0.89 mg/dL (ref 0.40–1.50)
GFR: 88.78 mL/min (ref >60.00)
Glucose, Bld: 99 mg/dL (ref 70–99)
Potassium: 4.2 mEq/L (ref 3.5–5.1)
Sodium: 139 mEq/L (ref 135–145)
Total Bilirubin: 1 mg/dL (ref 0.2–1.2)
Total Protein: 7.4 g/dL (ref 6.0–8.3)

## 2023-03-28 LAB — CBC WITH DIFFERENTIAL/PLATELET
Basophils Absolute: 0 10*3/uL (ref 0.0–0.1)
Basophils Relative: 0.6 % (ref 0.0–3.0)
Eosinophils Absolute: 0.3 10*3/uL (ref 0.0–0.7)
Eosinophils Relative: 3.7 % (ref 0.0–5.0)
HCT: 42.3 % (ref 39.0–52.0)
Hemoglobin: 14.6 g/dL (ref 13.0–17.0)
Lymphocytes Relative: 18.3 % (ref 12.0–46.0)
Lymphs Abs: 1.5 10*3/uL (ref 0.7–4.0)
MCHC: 34.4 g/dL (ref 30.0–36.0)
MCV: 88.9 fl (ref 78.0–100.0)
Monocytes Absolute: 0.7 10*3/uL (ref 0.1–1.0)
Monocytes Relative: 8.3 % (ref 3.0–12.0)
Neutro Abs: 5.5 10*3/uL (ref 1.4–7.7)
Neutrophils Relative %: 69.1 % (ref 43.0–77.0)
Platelets: 286 10*3/uL (ref 150.0–400.0)
RBC: 4.76 Mil/uL (ref 4.22–5.81)
RDW: 12.6 % (ref 11.5–15.5)
WBC: 7.9 10*3/uL (ref 4.0–10.5)

## 2023-03-28 MED ORDER — PREDNISONE 20 MG PO TABS: 40.0000 mg | ORAL_TABLET | Freq: Every day | ORAL | 0 refills | Status: DC

## 2023-03-28 NOTE — Progress Notes (Signed)
Acute Office Visit  Subjective:     Patient ID: Stephano Cay, male    DOB: Mar 06, 1956, 67 y.o.   MRN: [id number]  Chief Complaint  Patient presents with  . Nasal Congestion    Congestion, brain foggy, chills//sweats/, cough 2 weeks ago he tetsed pos for covid Was seen 09/17 and given antibiotic, he is currently still taking this.     HPI 67 year old white male, former smoker, patient of Dr. Lurlean Nanny is in today with persistent congestion, hot and cold spells after testing positive for COVID about 2 weeks ago.  2 days ago, he was started on Augmentin twice a day to help his symptoms.  He is not sure that that is really helping him at this point.  Wife reports that he is not eating much and not drinking.  He has been taking Tylenol and Mucinex that does help some.  Reports mild shortness of breath.  No chest pain.  Overall, just feels foggy.  Review of Systems  Constitutional:  Positive for chills, fever and malaise/fatigue.  HENT:  Positive for congestion.   Respiratory:  Positive for cough. Negative for wheezing.   Gastrointestinal: Negative.  Negative for nausea and vomiting.  Musculoskeletal: Negative.   Neurological: Negative.   Psychiatric/Behavioral: Negative.     Past Medical History:  Diagnosis Date  . Chronic vertigo 02/20/2023  . Complication of anesthesia    difficult to wake, stopped breathing, bagged  . Mixed hyperlipidemia 04/09/2020  . Pericarditis    a. 04/2020 following Afib ablation.  . Persistent atrial fibrillation (HCC)    a. 04/2020 s/p PVI.  Marland Kitchen Primary hypertension 02/06/2021    Social History   Socioeconomic History  . Marital status: Married    Spouse name: [name]  . Number of children: 2  . Years of education: high school  . Highest education level: Not on file  Occupational History  . Occupation: Curator  Tobacco Use  . Smoking status: Some Days    Types: Cigars  . Smokeless tobacco: Never  . Tobacco comments:    a few times a year   Vaping Use  . Vaping status: Never Used  Substance and Sexual Activity  . Alcohol use: Yes    Comment: less than monthly  . Drug use: Yes    Types: Marijuana    Comment: weekend  . Sexual activity: Yes    Birth control/protection: Post-menopausal  Other Topics Concern  . Not on file  Social History Narrative   01/13/20   From: [place], [place] originally - moved to [facility] 1996   Living: with wife [name] (1996), and step daughter (special needs)   Work: English as a second language teacher for [employer]      Family: 2 children - [name] and Imogene - in [place], good relationship and 3 grandchildren and 2 step children      Enjoys: drag racing, football      Exercise: not currently - walking and mobile job   Diet: not great, sometimes a little better      Safety   Seat belts: Yes    Guns: Yes  and secure   Safe in relationships: Yes    Social Determinants of Corporate investment banker Strain: Not on file  Food Insecurity: Not on file  Transportation Needs: Not on file  Physical Activity: Not on file  Stress: Not on file  Social Connections: Not on file  Intimate Partner Violence: Not on file    Past Surgical History:  Procedure  Laterality Date  . ATRIAL FIBRILLATION ABLATION N/A 04/07/2020   Procedure: ATRIAL FIBRILLATION ABLATION;  Surgeon: [name], MD;  [facility];  Service: Cardiovascular;  Laterality: N/A;  . BACK SURGERY    . BUBBLE STUDY  04/06/2020   Procedure: BUBBLE STUDY;  Surgeon: [name], MD;  Location: [facility];  Service: Cardiovascular;;  . FOOT SURGERY    . KNEE SURGERY Left   . TEE WITHOUT CARDIOVERSION N/A 04/06/2020   Procedure: TRANSESOPHAGEAL ECHOCARDIOGRAM (TEE);  Surgeon: [name], MD;  Location: [facility] ENDOSCOPY;  Service: Cardiovascular;  Laterality: N/A;    Family History  Problem Relation Age of Onset  . Heart disease Father   . Heart attack Father 24  . Cancer Cousin        unknown primary    Allergies   Allergen Reactions  . Other Other (See Comments)    Pt reports he "coded after having anesthesia with a back surgery"  Can not recall what medicine    Current Outpatient Medications on File Prior to Visit  Medication Sig Dispense Refill  . acetaminophen (TYLENOL) 500 MG tablet Take 500 mg by mouth every 6 (six) hours as needed.    Marland Kitchen amoxicillin-clavulanate (AUGMENTIN) 875-125 MG tablet Take 1 tablet by mouth 2 (two) times daily for 7 days. 14 tablet 0  . aspirin EC 81 MG tablet Take 81 mg by mouth daily. Swallow whole.    Marland Kitchen atorvastatin (LIPITOR) 20 MG tablet Take 1 tablet (20 mg total) by mouth daily. 90 tablet 1  . benzonatate (TESSALON) 200 MG capsule Take 1 capsule (200 mg total) by mouth 3 (three) times daily as needed for cough. 21 capsule 0  . cetirizine (ZYRTEC) 10 MG tablet Take 10 mg by mouth daily.    Marland Kitchen ibuprofen (ADVIL) 200 MG tablet Take 200 mg by mouth every 6 (six) hours as needed.    Marland Kitchen lisinopril (ZESTRIL) 40 MG tablet Take 1 tablet (40 mg total) by mouth daily. 90 tablet 3  . meclizine (ANTIVERT) 25 MG tablet Take 1 tablet (25 mg total) by mouth 3 (three) times daily as needed for dizziness. 30 tablet 0  . Probiotic Product (PROBIOTIC PO) Take 1 tablet by mouth daily. Nature Bounty Acidophilus Probiotic    . famotidine (PEPCID) 20 MG tablet Take 20 mg by mouth daily as needed for heartburn or indigestion. (Patient not taking: Reported on 03/25/2023)    . sildenafil (REVATIO) 20 MG tablet Take 3-5 tablets (60-100 mg total) by mouth daily as needed (intercourse). (Patient not taking: Reported on 03/25/2023) 30 tablet 1   No current facility-administered medications on file prior to visit.    BP (!) 168/92   Pulse 65   Temp (!) 97.3 F (36.3 C) (Temporal)   Ht 5\' 10"  (1.778 m)   Wt 189 lb (85.7 kg)   SpO2 98%   BMI 27.12 kg/m chart     Objective:    BP (!) 168/92   Pulse 65   Temp (!) 97.3 F (36.3 C) (Temporal)   Ht 5\' 10"  (1.778 m)   Wt 189 lb (85.7 kg)   SpO2  98%   BMI 27.12 kg/m    Physical Exam Vitals and nursing note reviewed.  Constitutional:      Appearance: Normal appearance. He is normal weight.  HENT:     Right Ear: Tympanic membrane, ear canal and external ear normal.     Left Ear: Tympanic membrane and external ear  normal.     Nose: Nose normal.     Mouth/Throat:     Mouth: Mucous membranes are dry.  Cardiovascular:     Rate and Rhythm: Normal rate and regular rhythm.  Pulmonary:     Effort: Pulmonary effort is normal. No respiratory distress.     Breath sounds: Normal breath sounds. No wheezing, rhonchi or rales.  Abdominal:     General: Abdomen is flat.     Palpations: Abdomen is soft.  Musculoskeletal:        General: Normal range of motion.  Skin:    General: Skin is warm and dry.  Neurological:     General: No focal deficit present.     Mental Status: He is alert and oriented to person, place, and time.  Psychiatric:        Mood and Affect: Mood normal.        Behavior: Behavior normal.   No results found for any visits on 03/28/23.      Assessment & Plan:   Problem List Items Addressed This Visit     COVID-19 - Primary   Relevant Orders   CBC w/Diff   CMP   Other Visit Diagnoses     Fatigue, unspecified type       Relevant Orders   CBC w/Diff   CMP       Meds ordered this encounter  Medications  . predniSONE (DELTASONE) 20 MG tablet    Sig: Take 2 tablets (40 mg total) by mouth daily with breakfast.    Dispense:  10 tablet    Refill:  0   Patient's underlying issue appears to be dehydration at this point.  I have encouraged him to increase his fluid intake to at least 95 ounces of fluid daily and try to begin to eat bland foods and advance as able to tolerate.  Labs obtained today to include CBC and CMP.  Will follow-up with patient pending results or sooner as needed.  Note provided for work to be out through Tuesday. No follow-ups on file.  Eulis Foster, FNP

## 2023-03-31 ENCOUNTER — Telehealth: Payer: Self-pay | Admitting: Family Medicine

## 2023-03-31 ENCOUNTER — Ambulatory Visit (INDEPENDENT_AMBULATORY_CARE_PROVIDER_SITE_OTHER): Payer: BC Managed Care – PPO

## 2023-03-31 DIAGNOSIS — I4819 Other persistent atrial fibrillation: Secondary | ICD-10-CM

## 2023-03-31 NOTE — Telephone Encounter (Signed)
Patient would like to know if he could have  his not extended for 1 more day that [place] wrote for him on Friday. She wrote him out until 9/24/20224,but he said that he needs atleast one more day to get more energy.

## 2023-04-01 LAB — CUP PACEART REMOTE DEVICE CHECK
Date Time Interrogation Session: 20240919231411
Implantable Pulse Generator Implant Date: 20220316

## 2023-04-01 NOTE — Telephone Encounter (Signed)
New work note written and sent to [employer]  I left message for patient letter him know this has been done.

## 2023-04-01 NOTE — Telephone Encounter (Signed)
Yes, that is fine.

## 2023-04-03 ENCOUNTER — Ambulatory Visit: Payer: BC Managed Care – PPO | Admitting: Internal Medicine

## 2023-04-03 ENCOUNTER — Ambulatory Visit: Payer: BC Managed Care – PPO | Admitting: Family Medicine

## 2023-04-03 ENCOUNTER — Encounter: Payer: Self-pay | Admitting: Internal Medicine

## 2023-04-03 ENCOUNTER — Encounter: Payer: Self-pay | Admitting: Gastroenterology

## 2023-04-03 VITALS — BP 138/88 | HR 62 | Temp 97.8°F | Ht 70.0 in | Wt 197.0 lb

## 2023-04-03 DIAGNOSIS — U099 Post covid-19 condition, unspecified: Secondary | ICD-10-CM | POA: Insufficient documentation

## 2023-04-03 NOTE — Progress Notes (Signed)
Subjective:    Patient ID: Dylan Olsen, male    DOB: 1956-03-05, 67 y.o.   MRN: [id number]  HPI Having problems since COVID infection  Had COVID symptoms starting about 3 weeks ago Still not feeling  Didn't have antivirals  Still feels "dragged down" Head pressure at times--frontal Clammy feeling inside---like burning up Trouble sleeping--and some depression about all this. 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     aspirin EC 81 MG tablet Take 81 mg by mouth daily. Swallow whole.     atorvastatin (LIPITOR) 20 MG tablet Take 1 tablet (20 mg total) by mouth daily. 90 tablet 1   benzonatate (TESSALON) 200 MG capsule Take 1 capsule (200 mg total) by mouth 3 (three) times daily as needed for cough. 21 capsule 0   cetirizine (ZYRTEC) 10 MG tablet Take 10 mg by mouth daily.     famotidine (PEPCID) 20 MG tablet Take 20 mg by mouth daily as needed for heartburn or indigestion.     ibuprofen (ADVIL) 200 MG tablet Take 200 mg by mouth every 6 (six) hours as needed.     lisinopril (ZESTRIL) 40 MG tablet Take 1 tablet (40 mg total) by mouth daily. 90 tablet 3   meclizine (ANTIVERT) 25 MG tablet Take 1 tablet (25 mg total) by mouth 3 (three) times daily as needed for dizziness. 30 tablet 0   Probiotic Product (PROBIOTIC PO) Take 1 tablet by mouth daily. Nature Bounty Acidophilus Probiotic     sildenafil (REVATIO) 20 MG tablet Take 3-5 tablets (60-100 mg total) by mouth daily as needed (intercourse). 30 tablet 1   No current facility-administered  medications on file prior to visit.    Allergies  Allergen Reactions   Other Other (See Comments)    Pt reports he "coded after having anesthesia with a back surgery"  Can not recall what medicine    Past Medical History:  Diagnosis Date   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 04/2020 following Afib ablation.   Persistent atrial fibrillation (HCC)    a. 04/2020 s/p PVI.   Primary hypertension 02/06/2021    Past Surgical History:  Procedure Laterality Date   ATRIAL FIBRILLATION ABLATION N/A 04/07/2020   Procedure: ATRIAL FIBRILLATION ABLATION;  Surgeon: [name], MD;  [facility];  Service: Cardiovascular;  Laterality: N/A;   BACK SURGERY     BUBBLE STUDY  04/06/2020   Procedure: BUBBLE STUDY;  Surgeon: [name], MD;  [facility]: [facility] ENDOSCOPY;  Service: Cardiovascular;;   FOOT SURGERY     KNEE SURGERY Left    TEE WITHOUT CARDIOVERSION N/A 04/06/2020   Procedure: TRANSESOPHAGEAL ECHOCARDIOGRAM (TEE);  Surgeon: [name], MD;  Location: [facility] ENDOSCOPY;  Service: Cardiovascular;  Laterality: N/A;    Family History  Problem Relation Age of Onset   Heart disease Father    Heart attack Father 86   Cancer Cousin  unknown primary    Social History   Socioeconomic History   Marital status: Married    Spouse name: [name]   Number of children: 2   Years of education: high school   Highest education level: Not on file  Occupational History   Occupation: Curator  Tobacco Use   Smoking status: Some Days    Types: Cigars   Smokeless tobacco: Never   Tobacco comments:    a few times a year  Vaping Use   Vaping status: Never Used  Substance and Sexual Activity   Alcohol use: Yes    Comment: less than monthly   Drug use: Yes    Types: Marijuana    Comment: weekend   Sexual activity: Yes    Birth  control/protection: Post-menopausal  Other Topics Concern   Not on file  Social History Narrative   01/13/20   From: [place], [place] originally - moved to [place] 1996   Living: with wife [name] (1996), and step daughter (special needs)   Work: English as a second language teacher for [employer]      Family: 2 children - [name] and [place] - in [place], good relationship and 3 grandchildren and 2 step children      Enjoys: drag racing, football      Exercise: not currently - walking and mobile job   Diet: not great, sometimes a little better      Safety   Seat belts: Yes    Guns: Yes  and secure   Safe in relationships: Yes    Social Determinants of Corporate investment banker Strain: Not on file  Food Insecurity: Not on file  Transportation Needs: Not on file  Physical Activity: Not on file  Stress: Not on file  Social Connections: Not on file  Intimate Partner Violence: Not on file   Review of Systems Didn't eat the first week--now eating better but still less  Weight fairly stable No vomiting---no nausea Loose stools for 2 days--back to normal No rash Pepsi--2 ea 16 ounce daily     Objective:   Physical Exam Constitutional:      Appearance: Normal appearance.  Cardiovascular:     Rate and Rhythm: Normal rate and regular rhythm.     Heart sounds: No murmur heard.    No gallop.  Pulmonary:     Effort: Pulmonary effort is normal.     Breath sounds: Normal breath sounds. No wheezing or rales.  Abdominal:     Palpations: Abdomen is soft.     Tenderness: There is no abdominal tenderness.  Musculoskeletal:     Cervical back: Neck supple.     Right lower leg: No edema.     Left lower leg: No edema.  Lymphadenopathy:     Cervical: No cervical adenopathy.  Neurological:     Mental Status: He is alert.  Psychiatric:        Mood and Affect: Mood normal.        Behavior: Behavior normal.            Assessment & Plan:

## 2023-04-03 NOTE — Patient Instructions (Signed)
Please cut out the afternoon caffeine You can consider trying low dose melatonin to help your sleep---try 1mg  and increase to 2 or 3 mg if needed.

## 2023-04-03 NOTE — Assessment & Plan Note (Signed)
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

## 2023-04-10 ENCOUNTER — Ambulatory Visit: Payer: BC Managed Care – PPO | Admitting: Internal Medicine

## 2023-04-10 ENCOUNTER — Encounter: Payer: Self-pay | Admitting: Internal Medicine

## 2023-04-10 VITALS — BP 112/70 | HR 70 | Temp 97.9°F | Ht 70.0 in | Wt 195.0 lb

## 2023-04-10 DIAGNOSIS — U099 Post covid-19 condition, unspecified: Secondary | ICD-10-CM

## 2023-04-10 NOTE — Assessment & Plan Note (Signed)
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

## 2023-04-10 NOTE — Progress Notes (Signed)
Subjective:    Patient ID: Dylan Olsen, male    DOB: 09/02/1955, 67 y.o.   MRN: [id number]  HPI Here with wife to reevaluate post COVID symptoms  Not doing well still Joints feel "clammy"---still in a fog Not sleeping well Intermittent headache  Stomach symptoms seem better Had sense of "air moving around" Wife gave him antacid yesterday--it might have helped  Current Outpatient Medications on File Prior to Visit  Medication Sig Dispense Refill   acetaminophen (TYLENOL) 500 MG tablet Take 500 mg by mouth every 6 (six) hours as needed.     aspirin EC 81 MG tablet Take 81 mg by mouth daily. Swallow whole.     atorvastatin (LIPITOR) 20 MG tablet Take 1 tablet (20 mg total) by mouth daily. 90 tablet 1   cetirizine (ZYRTEC) 10 MG tablet Take 10 mg by mouth daily.     famotidine (PEPCID) 20 MG tablet Take 20 mg by mouth daily as needed for heartburn or indigestion.     ibuprofen (ADVIL) 200 MG tablet Take 200 mg by mouth every 6 (six) hours as needed.     lisinopril (ZESTRIL) 40 MG tablet Take 1 tablet (40 mg total) by mouth daily. 90 tablet 3   meclizine (ANTIVERT) 25 MG tablet Take 1 tablet (25 mg total) by mouth 3 (three) times daily as needed for dizziness. 30 tablet 0   Probiotic Product (PROBIOTIC PO) Take 1 tablet by mouth daily. Nature Bounty Acidophilus Probiotic     sildenafil (REVATIO) 20 MG tablet Take 3-5 tablets (60-100 mg total) by mouth daily as needed (intercourse). 30 tablet 1   No current facility-administered medications on file prior to visit.    Allergies  Allergen Reactions   Other Other (See Comments)    Pt reports he "coded after having anesthesia with a back surgery"  Can not recall what medicine    Past Medical History:  Diagnosis Date   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 04/2020 following Afib ablation.   Persistent  atrial fibrillation (HCC)    a. 04/2020 s/p PVI.   Primary hypertension 02/06/2021    Past Surgical History:  Procedure Laterality Date   ATRIAL FIBRILLATION ABLATION N/A 04/07/2020   Procedure: ATRIAL FIBRILLATION ABLATION;  Surgeon: [name], MD;  [facility];  Service: Cardiovascular;  Laterality: N/A;   BACK SURGERY     BUBBLE STUDY  04/06/2020   Procedure: BUBBLE STUDY;  Surgeon: [name], MD;  [facility]: [facility] ENDOSCOPY;  Service: Cardiovascular;;   FOOT SURGERY     KNEE SURGERY Left    TEE WITHOUT CARDIOVERSION N/A 04/06/2020   Procedure: TRANSESOPHAGEAL ECHOCARDIOGRAM (TEE);  Surgeon: [name], MD;  Location: [facility] ENDOSCOPY;  Service: Cardiovascular;  Laterality: N/A;    Family History  Problem Relation Age of Onset   Heart disease Father    Heart attack Father 72   Cancer Cousin        unknown primary    Social History   Socioeconomic History   Marital status: Married    Spouse name: [name]   Number of children: 2   Years of education: high school   Highest education level: Not on file  Occupational History   Occupation: Curator  Tobacco Use   Smoking status: Some Days    Types: Cigars   Smokeless tobacco:  Never   Tobacco comments:    a few times a year  Vaping Use   Vaping status: Never Used  Substance and Sexual Activity   Alcohol use: Yes    Comment: less than monthly   Drug use: Yes    Types: Marijuana    Comment: weekend   Sexual activity: Yes    Birth control/protection: Post-menopausal  Other Topics Concern   Not on file  Social History Narrative   01/13/20   From: [place], [place] originally - moved to [place] 1996   Living: with wife [name] (1996), and step daughter (special needs)   Work: English as a second language teacher for [employer]      Family: 2 children - [name] and [place] - in [place], good relationship and 3 grandchildren and 2 step children      Enjoys: drag racing, football      Exercise: not currently -  walking and mobile job   Diet: not great, sometimes a little better      Safety   Seat belts: Yes    Guns: Yes  and secure   Safe in relationships: Yes    Social Determinants of Corporate investment banker Strain: Not on file  Food Insecurity: Not on file  Transportation Needs: Not on file  Physical Activity: Not on file  Stress: Not on file  Social Connections: Not on file  Intimate Partner Violence: Not on file   Review of Systems Some hot flashes Some vertigo if he stands up quickly--but not pre syncope     Objective:   Physical Exam Constitutional:      Appearance: Normal appearance.  Neurological:     Mental Status: He is alert.            Assessment & Plan:

## 2023-04-15 DIAGNOSIS — Z0279 Encounter for issue of other medical certificate: Secondary | ICD-10-CM

## 2023-04-15 NOTE — Progress Notes (Signed)
Carelink Summary Report / Loop Recorder 

## 2023-04-18 ENCOUNTER — Telehealth: Payer: Self-pay

## 2023-04-18 NOTE — Telephone Encounter (Signed)
Forms from [employer] filled out and faxed back. Confirmation received. Forms sent to front for scanning.

## 2023-04-22 NOTE — Progress Notes (Unsigned)
    Dylan Olsen T. Arleene Settle, MD, CAQ Sports Medicine [facility] [address] [place], [address]  Phone: [phone]  FAX: [phone]  Dylan Olsen - 67 y.o. male  MRN [id number]  Date of Birth: June 23, 1956  Date: 04/24/2023  PCP: [name], MD  Referral: [name], MD  No chief complaint on file.  Subjective:   Dylan Olsen is a 67 y.o. very pleasant male patient with There is no height or weight on file to calculate BMI. who presents with the following:  Dylan Olsen is a very nice patient who I saw in August for his complete physical exam.  At point he was doing fairly well.  He has also fairly recently had COVID-19, and he saw both Mr. Dylan Olsen and Mrs. Dylan Olsen.  The interval he has also seen Dr. Alphonsus Sias twice for post-COVID symptoms.  That time he was feeling dragged down, frontal head pressure, clammy, trouble sleeping, and some depression.  He had intermittently trouble sleeping as well. He also was having some coughing and brain fog.  He most recently saw Dr. Alphonsus Sias on April 10, 2023.  At that point he was still doing poorly, and he had follow-up for short-term disability.  At that time, Dr. Alphonsus Sias also referred him to neurology.  Ultimately, he was referred to the [facility] COVID recovery clinic.      Review of Systems is noted in the HPI, as appropriate  Objective:   There were no vitals taken for this visit.  GEN: No acute distress; alert,appropriate. PULM: Breathing comfortably in no respiratory distress PSYCH: Normally interactive.   Laboratory and Imaging Data:  Assessment and Plan:   ***

## 2023-04-23 ENCOUNTER — Ambulatory Visit (AMBULATORY_SURGERY_CENTER): Payer: BC Managed Care – PPO

## 2023-04-23 VITALS — Ht 70.0 in | Wt 194.0 lb

## 2023-04-23 DIAGNOSIS — R195 Other fecal abnormalities: Secondary | ICD-10-CM

## 2023-04-23 DIAGNOSIS — Z1211 Encounter for screening for malignant neoplasm of colon: Secondary | ICD-10-CM

## 2023-04-23 MED ORDER — NA SULFATE-K SULFATE-MG SULF 17.5-3.13-1.6 GM/177ML PO SOLN
1.0000 | Freq: Once | ORAL | 0 refills | Status: AC
Start: 2023-04-23 — End: 2023-04-23

## 2023-04-23 NOTE — Progress Notes (Signed)
No egg or soy allergy known to patient  No issues known to pt with past sedation with any surgeries or procedures: Pt reports hard time waking up from General anesthesia has had an EGD in the past and did fine at that time.  Patient denies ever being told they had issues or difficulty with intubation  No FH of Malignant Hyperthermia Pt is not on diet pills Pt is not on  home 02  Pt is not on blood thinners  Pt denies issues with constipation  Hx AfIB (s/p ablation) now controlled patient in NSR does wear loop recorder  Have any cardiac testing pending--no LOA: independent  Prep:   Patient's chart reviewed by Cathlyn Parsons CNRA prior to previsit and patient appropriate for the [facility].  Previsit completed and red dot placed by patient's name on their procedure day (on provider's schedule).     PV competed with patient. Prep instructions sent via mychart and home address. Goodrx coupon for [facility] provided to use for price reduction if needed.

## 2023-04-24 ENCOUNTER — Encounter: Payer: Self-pay | Admitting: Family Medicine

## 2023-04-24 ENCOUNTER — Ambulatory Visit: Payer: BC Managed Care – PPO | Admitting: Family Medicine

## 2023-04-24 VITALS — BP 130/88 | HR 64 | Temp 97.5°F | Ht 70.0 in | Wt 200.5 lb

## 2023-04-24 DIAGNOSIS — U099 Post covid-19 condition, unspecified: Secondary | ICD-10-CM

## 2023-04-24 DIAGNOSIS — F32A Depression, unspecified: Secondary | ICD-10-CM

## 2023-04-24 MED ORDER — FLUOXETINE HCL 20 MG PO CAPS: 20.0000 mg | ORAL_CAPSULE | Freq: Every day | ORAL | 3 refills | Status: DC

## 2023-04-30 DIAGNOSIS — U099 Post covid-19 condition, unspecified: Secondary | ICD-10-CM | POA: Diagnosis not present

## 2023-04-30 DIAGNOSIS — R519 Headache, unspecified: Secondary | ICD-10-CM | POA: Diagnosis not present

## 2023-04-30 DIAGNOSIS — R4189 Other symptoms and signs involving cognitive functions and awareness: Secondary | ICD-10-CM | POA: Diagnosis not present

## 2023-04-30 DIAGNOSIS — R2990 Unspecified symptoms and signs involving the nervous system: Secondary | ICD-10-CM | POA: Diagnosis not present

## 2023-05-01 ENCOUNTER — Encounter: Payer: Self-pay | Admitting: Gastroenterology

## 2023-05-05 ENCOUNTER — Ambulatory Visit (INDEPENDENT_AMBULATORY_CARE_PROVIDER_SITE_OTHER): Payer: BC Managed Care – PPO

## 2023-05-05 DIAGNOSIS — I4819 Other persistent atrial fibrillation: Secondary | ICD-10-CM

## 2023-05-05 LAB — CUP PACEART REMOTE DEVICE CHECK
Date Time Interrogation Session: 20241027230314
Implantable Pulse Generator Implant Date: 20220316

## 2023-05-14 ENCOUNTER — Encounter: Payer: Self-pay | Admitting: Gastroenterology

## 2023-05-14 ENCOUNTER — Ambulatory Visit: Payer: BC Managed Care – PPO | Admitting: Gastroenterology

## 2023-05-14 VITALS — BP 128/78 | HR 53 | Temp 98.4°F | Resp 13 | Ht 70.0 in | Wt 194.0 lb

## 2023-05-14 DIAGNOSIS — D124 Benign neoplasm of descending colon: Secondary | ICD-10-CM

## 2023-05-14 DIAGNOSIS — Z1211 Encounter for screening for malignant neoplasm of colon: Secondary | ICD-10-CM | POA: Diagnosis not present

## 2023-05-14 DIAGNOSIS — D123 Benign neoplasm of transverse colon: Secondary | ICD-10-CM | POA: Diagnosis not present

## 2023-05-14 DIAGNOSIS — D122 Benign neoplasm of ascending colon: Secondary | ICD-10-CM

## 2023-05-14 DIAGNOSIS — D125 Benign neoplasm of sigmoid colon: Secondary | ICD-10-CM | POA: Diagnosis not present

## 2023-05-14 MED ORDER — SODIUM CHLORIDE 0.9 % IV SOLN: 500.0000 mL | Freq: Once | INTRAVENOUS | Status: DC

## 2023-05-14 NOTE — Progress Notes (Signed)
Pt's states no medical or surgical changes since previsit or office visit. 

## 2023-05-14 NOTE — Progress Notes (Signed)
Sedate, gd SR, tolerated procedure well, VSS, report to RN 

## 2023-05-14 NOTE — Patient Instructions (Signed)
Educational handout provided to patient related to  Polyps  Resume previous diet  Continue present medications  Awaiting pathology results   YOU HAD AN ENDOSCOPIC PROCEDURE TODAY AT THE  ENDOSCOPY CENTER:   Refer to the procedure report that was given to you for any specific questions about what was found during the examination.  If the procedure report does not answer your questions, please call your gastroenterologist to clarify.  If you requested that your care partner not be given the details of your procedure findings, then the procedure report has been included in a sealed envelope for you to review at your convenience later.  YOU SHOULD EXPECT: Some feelings of bloating in the abdomen. Passage of more gas than usual.  Walking can help get rid of the air that was put into your GI tract during the procedure and reduce the bloating. If you had a lower endoscopy (such as a colonoscopy or flexible sigmoidoscopy) you may notice spotting of blood in your stool or on the toilet paper. If you underwent a bowel prep for your procedure, you may not have a normal bowel movement for a few days.  Please Note:  You might notice some irritation and congestion in your nose or some drainage.  This is from the oxygen used during your procedure.  There is no need for concern and it should clear up in a day or so.  SYMPTOMS TO REPORT IMMEDIATELY:  Following lower endoscopy (colonoscopy or flexible sigmoidoscopy):  Excessive amounts of blood in the stool  Significant tenderness or worsening of abdominal pains  Swelling of the abdomen that is new, acute  Fever of 100F or higher   For urgent or emergent issues, a gastroenterologist can be reached at any hour by calling [phone]. Do not use MyChart messaging for urgent concerns.    DIET:  We do recommend a small meal at first, but then you may proceed to your regular diet.  Drink plenty of fluids but you should avoid alcoholic beverages for  24 hours.  ACTIVITY:  You should plan to take it easy for the rest of today and you should NOT DRIVE or use heavy machinery until tomorrow (because of the sedation medicines used during the test).    FOLLOW UP: Our staff will call the number listed on your records the next business day following your procedure.  We will call around 7:15- 8:00 am to check on you and address any questions or concerns that you may have regarding the information given to you following your procedure. If we do not reach you, we will leave a message.     If any biopsies were taken you will be contacted by phone or by letter within the next 1-3 weeks.  Please call us at [phone] if you have not heard about the biopsies in 3 weeks.    SIGNATURES/CONFIDENTIALITY: You and/or your care partner have signed paperwork which will be entered into your electronic medical record.  These signatures attest to the fact that that the information above on your After Visit Summary has been reviewed and is understood.  Full responsibility of the confidentiality of this discharge information lies with you and/or your care-partner.

## 2023-05-14 NOTE — Progress Notes (Signed)
History and Physical:  This patient presents for endoscopic testing for: Encounter Diagnoses  Name Primary?   Special screening for malignant neoplasms, colon Yes   Benign neoplasm of ascending colon    Benign neoplasm of transverse colon    Benign neoplasm of descending colon     Average risk for colorectal cancer.  First screening exam.   CBC  normal, most recently Sept 2024 Denies any chronic upper or lower GI symptoms No family Hx CRC   Patient is otherwise without complaints or active issues today.   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 04/2020 following Afib ablation.   Persistent atrial fibrillation (HCC)    a. 04/2020 s/p PVI.   Primary hypertension 02/06/2021     Past Surgical History: Past Surgical History:  Procedure Laterality Date   ATRIAL FIBRILLATION ABLATION N/A 04/07/2020   Procedure: ATRIAL FIBRILLATION ABLATION;  Surgeon: [name], MD;  Location: [facility]VASIVE CV LAB;  Service: Cardiovascular;  Laterality: N/A;   BACK SURGERY     BUBBLE STUDY  04/06/2020   Procedure: BUBBLE STUDY;  Surgeon: [name], MD;  Location: [facility] ENDOSCOPY;  Service: Cardiovascular;;   FOOT SURGERY     KNEE SURGERY Left    TEE WITHOUT CARDIOVERSION N/A 04/06/2020   Procedure: TRANSESOPHAGEAL ECHOCARDIOGRAM (TEE);  Surgeon: [name], MD;  Location: [facility] ENDOSCOPY;  Service: Cardiovascular;  Laterality: N/A;    Allergies: Allergies  Allergen Reactions   Other Other (See Comments)    Pt reports he "coded after having anesthesia with a back surgery"  Can not recall what medicine    Outpatient Meds: Current Outpatient Medications  Medication Sig Dispense Refill   atorvastatin (LIPITOR) 20 MG tablet Take 1 tablet (20 mg total) by mouth daily. 90 tablet 1    cetirizine (ZYRTEC) 10 MG tablet Take 10 mg by mouth daily.     FLUoxetine (PROZAC) 20 MG capsule Take 1 capsule (20 mg total) by mouth daily. 30 capsule 3   ibuprofen (ADVIL) 200 MG tablet Take 200 mg by mouth every 6 (six) hours as needed.     lisinopril (ZESTRIL) 40 MG tablet Take 1 tablet (40 mg total) by mouth daily. 90 tablet 3   meclizine (ANTIVERT) 25 MG tablet Take 1 tablet (25 mg total) by mouth 3 (three) times daily as needed for dizziness. 30 tablet 0   Probiotic Product (PROBIOTIC PO) Take 1 tablet by mouth daily. Nature Bounty Acidophilus Probiotic     acetaminophen (TYLENOL) 500 MG tablet Take 500 mg by mouth every 6 (six) hours as needed.     aspirin EC 81 MG tablet Take 81 mg by mouth daily. Swallow whole.     famotidine (PEPCID) 20 MG tablet Take 20 mg by mouth daily as needed for heartburn or indigestion.     sildenafil (REVATIO) 20 MG tablet Take 3-5 tablets (60-100 mg total) by mouth daily as needed (intercourse). 30 tablet 1   Current Facility-Administered Medications  Medication Dose Route Frequency Provider Last Rate Last Admin   0.9 %  sodium chloride infusion  500 mL Intravenous Once Charlie Pitter III, MD          ___________________________________________________________________ Objective   Exam:  BP (!) 140/88   Pulse 64   Temp 98.4 F (36.9 C) (Temporal)  Resp 12   Ht 5\' 10"  (1.778 m)   Wt 194 lb (88 kg)   SpO2 97%   BMI 27.84 kg/m   CV: regular , S1/S2 Resp: clear to auscultation bilaterally, normal RR and effort noted GI: soft, no tenderness, with active bowel sounds.   Assessment: Encounter Diagnoses  Name Primary?   Special screening for malignant neoplasms, colon Yes                 Plan: Colonoscopy   The benefits and risks of the planned procedure were described in detail with the patient or (when appropriate) their health care proxy.  Risks were outlined as including, but not limited to, bleeding, infection, perforation,  adverse medication reaction leading to cardiac or pulmonary decompensation, pancreatitis (if ERCP).  The limitation of incomplete mucosal visualization was also discussed.  No guarantees or warranties were given.  The patient is appropriate for an endoscopic procedure in the ambulatory setting.   - Amada Jupiter, MD

## 2023-05-14 NOTE — Op Note (Signed)
[facility] [name] Procedure Date: 05/14/2023 7:52 AM MRN: [id number] Endoscopist: Sherilyn Cooter L. Myrtie Neither , MD, [id number] Age: 67 Referring MD:  Date of Birth: Jan 14, 1956 Gender: Male Account #: [id number] Procedure:                Colonoscopy Indications:              Screening for colorectal malignant neoplasm, This                            is the patient's first colonoscopy Medicines:                Monitored Anesthesia Care Procedure:                Pre-Anesthesia Assessment:                           - Prior to the procedure, a History and Physical                            was performed, and patient medications and                            allergies were reviewed. The patient's tolerance of                            previous anesthesia was also reviewed. The risks                            and benefits of the procedure and the sedation                            options and risks were discussed with the patient.                            All questions were answered, and informed consent                            was obtained. Prior Anticoagulants: The patient has                            taken no anticoagulant or antiplatelet agents                            except for aspirin. ASA Grade Assessment: II - A                            patient with mild systemic disease. After reviewing                            the risks and benefits, the patient was deemed in                            satisfactory condition to undergo the procedure.  After obtaining informed consent, the colonoscope                            was passed under direct vision. Throughout the                            procedure, the patient's blood pressure, pulse, and                            oxygen saturations were monitored continuously. The                            CF [id number] #[id number] was introduced through the anus                            and  advanced to the the cecum, identified by                            appendiceal orifice and ileocecal valve. The                            colonoscopy was performed without difficulty. The                            patient tolerated the procedure well. The quality                            of the bowel preparation was excellent. The                            ileocecal valve, appendiceal orifice, and rectum                            were photographed. Scope In: 8:14:44 AM Scope Out: 8:32:58 AM Scope Withdrawal Time: 0 hours 15 minutes 58 seconds  Total Procedure Duration: 0 hours 18 minutes 14 seconds  Findings:                 The perianal and digital rectal examinations were                            normal.                           Repeat examination of right colon under NBI                            performed.                           Diverticula were found in the left colon.                           Six flat and sessile polyps were found in the  sigmoid(1) colon, descending colon (2), transverse                            colon(2) and ascending(1) colon. The polyps were 2                            to 8 mm in size. These polyps were removed with a                            cold snare. Resection and retrieval were complete.                           Internal hemorrhoids were found.                           The exam was otherwise without abnormality on                            direct and retroflexion views. Complications:            No immediate complications. Estimated Blood Loss:     Estimated blood loss was minimal. Impression:               - Diverticulosis in the left colon.                           - Six 2 to 8 mm polyps in the sigmoid colon, in the                            descending colon, in the transverse colon and in                            the ascending colon, removed with a cold snare.                            Resected and  retrieved.                           - Internal hemorrhoids.                           - The examination was otherwise normal on direct                            and retroflexion views. Recommendation:           - Patient has a contact number available for                            emergencies. The signs and symptoms of potential                            delayed complications were discussed with the  patient. Return to normal activities tomorrow.                            Written discharge instructions were provided to the                            patient.                           - Resume previous diet.                           - Continue present medications.                           - Await pathology results.                           - Repeat colonoscopy is recommended for                            surveillance. The colonoscopy date will be                            determined after pathology results from today's                            exam become available for review. Osborn Pullin L. Myrtie Neither, MD 05/14/2023 8:39:25 AM This report has been signed electronically.

## 2023-05-15 ENCOUNTER — Telehealth: Payer: Self-pay

## 2023-05-15 NOTE — Telephone Encounter (Signed)
  Follow up Call-     05/14/2023    7:16 AM  Call back number  Post procedure Call Back phone  # [phone]  Permission to leave phone message Yes     Patient questions:  Do you have a fever, pain , or abdominal swelling? No. Pain Score  0 *  Have you tolerated food without any problems? Yes.    Have you been able to return to your normal activities? Yes.    Do you have any questions about your discharge instructions: Diet   No. Medications  No. Follow up visit  No.  Do you have questions or concerns about your Care? No.  Actions: * If pain score is 4 or above: No action needed, pain <4.

## 2023-05-18 LAB — SURGICAL PATHOLOGY

## 2023-05-19 ENCOUNTER — Encounter: Payer: Self-pay | Admitting: Gastroenterology

## 2023-05-26 NOTE — Progress Notes (Signed)
Carelink Summary Report / Loop Recorder 

## 2023-06-06 ENCOUNTER — Ambulatory Visit (INDEPENDENT_AMBULATORY_CARE_PROVIDER_SITE_OTHER): Payer: BC Managed Care – PPO

## 2023-06-06 DIAGNOSIS — I4819 Other persistent atrial fibrillation: Secondary | ICD-10-CM

## 2023-06-08 LAB — CUP PACEART REMOTE DEVICE CHECK
Date Time Interrogation Session: 20241129230240
Implantable Pulse Generator Implant Date: 20220316

## 2023-06-08 NOTE — Progress Notes (Unsigned)
    Tamel Abel T. Taziyah Iannuzzi, MD, CAQ Sports Medicine [facility] at [facility] [address] [place], [address]  Phone: [phone]  FAX: [phone]  Keeler Mcwain - 67 y.o. male  MRN [id number]  Date of Birth: Nov 24, 1955  Date: 06/09/2023  PCP: [name], MD  Referral: [name], MD  No chief complaint on file.  Subjective:   Obrian Battistelli is a 67 y.o. very pleasant male patient with There is no height or weight on file to calculate BMI. who presents with the following:  Kamalei is here for 6-week follow-up.  The last time I saw him, he was really struggling with long COVID symptoms.  At the same time he had started to get acutely depressed.  I did start him on some Prozac 20 mg, and he is here today to follow-up regarding all this.    Review of Systems is noted in the HPI, as appropriate  Objective:   There were no vitals taken for this visit.  GEN: No acute distress; alert,appropriate. PULM: Breathing comfortably in no respiratory distress PSYCH: Normally interactive.   Laboratory and Imaging Data:  Assessment and Plan:   ***

## 2023-06-09 ENCOUNTER — Ambulatory Visit: Payer: BC Managed Care – PPO | Admitting: Family Medicine

## 2023-06-09 ENCOUNTER — Encounter: Payer: Self-pay | Admitting: Family Medicine

## 2023-06-09 VITALS — BP 162/90 | HR 61 | Temp 98.1°F | Ht 70.0 in | Wt 206.5 lb

## 2023-06-09 DIAGNOSIS — F32A Depression, unspecified: Secondary | ICD-10-CM

## 2023-06-09 DIAGNOSIS — I10 Essential (primary) hypertension: Secondary | ICD-10-CM

## 2023-06-09 DIAGNOSIS — U099 Post covid-19 condition, unspecified: Secondary | ICD-10-CM

## 2023-06-11 ENCOUNTER — Encounter: Payer: Self-pay | Admitting: Family Medicine

## 2023-06-15 ENCOUNTER — Other Ambulatory Visit: Payer: Self-pay | Admitting: Family Medicine

## 2023-06-15 ENCOUNTER — Other Ambulatory Visit: Payer: Self-pay | Admitting: Cardiology

## 2023-06-15 DIAGNOSIS — E782 Mixed hyperlipidemia: Secondary | ICD-10-CM

## 2023-06-16 NOTE — Telephone Encounter (Signed)
Patient has CPE on 02/19/2023 but declined all labs.  Last Lipid 02/12/22.  Refill?

## 2023-06-16 NOTE — Telephone Encounter (Signed)
Can you do a RTW note for 12/6 without restrictions?

## 2023-07-14 ENCOUNTER — Ambulatory Visit (INDEPENDENT_AMBULATORY_CARE_PROVIDER_SITE_OTHER): Payer: BC Managed Care – PPO

## 2023-07-14 DIAGNOSIS — I4819 Other persistent atrial fibrillation: Secondary | ICD-10-CM

## 2023-07-14 LAB — CUP PACEART REMOTE DEVICE CHECK
Date Time Interrogation Session: 20250103230252
Implantable Pulse Generator Implant Date: 20220316

## 2023-08-18 ENCOUNTER — Ambulatory Visit (INDEPENDENT_AMBULATORY_CARE_PROVIDER_SITE_OTHER): Payer: BC Managed Care – PPO

## 2023-08-18 DIAGNOSIS — I4819 Other persistent atrial fibrillation: Secondary | ICD-10-CM

## 2023-08-18 LAB — CUP PACEART REMOTE DEVICE CHECK
Date Time Interrogation Session: 20250207230252
Implantable Pulse Generator Implant Date: 20220316

## 2023-08-21 ENCOUNTER — Encounter: Payer: Self-pay | Admitting: Cardiology

## 2023-08-25 NOTE — Progress Notes (Signed)
 [facility] Summary Report / Loop Recorder

## 2023-09-22 ENCOUNTER — Ambulatory Visit (INDEPENDENT_AMBULATORY_CARE_PROVIDER_SITE_OTHER): Payer: BC Managed Care – PPO

## 2023-09-22 DIAGNOSIS — I4819 Other persistent atrial fibrillation: Secondary | ICD-10-CM

## 2023-09-23 LAB — CUP PACEART REMOTE DEVICE CHECK
Date Time Interrogation Session: 20250316230533
Implantable Pulse Generator Implant Date: 20220316

## 2023-09-28 ENCOUNTER — Encounter: Payer: Self-pay | Admitting: Cardiology

## 2023-09-29 NOTE — Progress Notes (Signed)
 [facility] Summary Report / Loop Recorder

## 2023-10-01 ENCOUNTER — Ambulatory Visit (HOSPITAL_COMMUNITY)
Admission: RE | Admit: 2023-10-01 | Discharge: 2023-10-01 | Disposition: A | Discharge status: Home / Self Care | Payer: Self-pay | Source: Ambulatory Visit | Attending: Physician Assistant | Admitting: Physician Assistant

## 2023-10-01 ENCOUNTER — Encounter (HOSPITAL_COMMUNITY): Payer: Self-pay | Admitting: Physician Assistant

## 2023-10-01 VITALS — BP 110/90 | HR 76 | Ht 70.0 in | Wt 192.8 lb

## 2023-10-01 DIAGNOSIS — I110 Hypertensive heart disease with heart failure: Secondary | ICD-10-CM | POA: Insufficient documentation

## 2023-10-01 DIAGNOSIS — I5022 Chronic systolic (congestive) heart failure: Secondary | ICD-10-CM | POA: Insufficient documentation

## 2023-10-01 DIAGNOSIS — D6869 Other thrombophilia: Secondary | ICD-10-CM | POA: Insufficient documentation

## 2023-10-01 DIAGNOSIS — Z7901 Long term (current) use of anticoagulants: Secondary | ICD-10-CM | POA: Insufficient documentation

## 2023-10-01 DIAGNOSIS — I4819 Other persistent atrial fibrillation: Secondary | ICD-10-CM | POA: Insufficient documentation

## 2023-10-01 MED ORDER — APIXABAN 5 MG PO TABS: 5.0000 mg | ORAL_TABLET | Freq: Two times a day (BID) | ORAL | 3 refills | Status: DC

## 2023-10-01 NOTE — Patient Instructions (Signed)
 Stop aspirin  Start Eliquis 5mg  twice a day

## 2023-10-01 NOTE — Progress Notes (Addendum)
 Primary Care Physician: [name], MD Primary Electrophysiologist: Dr [name] Referring Physician: Dr Particia Jasper [name] is a 68 y.o. male with a history of systolic dysfunction, HTN, atrial fibrillation who presents for follow up in the [facility].  The patient was initially diagnosed with atrial fibrillation 01/2020 at a PCP office visit after presenting with symptoms of dyspnea with exertion. He had an echo which showed EF 40-45%. Seen by Dr [name] on 03/06/20, started on Eliquis for stroke prevention. He underwent afib ablation on 04/07/20. He underwent ILR implant on 09/20/20 for continued afib monitoring. His Eliquis was discontinued.   Patient returns for follow up for atrial fibrillation. His ILR showed a 3.5 hour episode of afib on 3/9. He was unaware of his arrhythmia at the time. There were no specific triggers that he could identify. He previously tolerated Eliquis without bleeding issues.   Today, he denies symptoms of palpitations, chest pain, shortness of breath, orthopnea, PND, lower extremity edema, dizziness, presyncope, syncope, snoring, daytime somnolence, bleeding, or neurologic sequela. The patient is tolerating medications without difficulties and is otherwise without complaint today.     Atrial Fibrillation Risk Factors:  he does not have symptoms or diagnosis of sleep apnea. he does not have a history of rheumatic fever.   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 04/2020 following Afib ablation.   Persistent atrial fibrillation (HCC)    a. 04/2020 s/p PVI.   Primary hypertension  02/06/2021    Current Outpatient Medications  Medication Sig Dispense Refill   acetaminophen (TYLENOL) 500 MG tablet Take 500 mg by mouth every 6 (six) hours as needed.     apixaban (ELIQUIS) 5 MG TABS tablet Take 1 tablet (5 mg total) by mouth 2 (two) times daily. 60 tablet 3   atorvastatin (LIPITOR) 20 MG tablet TAKE 1 TABLET BY MOUTH EVERY DAY 90 tablet 3   cetirizine (ZYRTEC) 10 MG tablet Take 10 mg by mouth daily.     famotidine (PEPCID) 20 MG tablet Take 20 mg by mouth daily as needed for heartburn or indigestion.     FLUoxetine (PROZAC) 20 MG capsule Take 1 capsule (20 mg total) by mouth daily. 30 capsule 3   ibuprofen (ADVIL) 200 MG tablet Take 200 mg by mouth every 6 (six) hours as needed.     lisinopril (ZESTRIL) 40 MG tablet Take 1 tablet (40 mg total) by mouth daily. 90 tablet 3   meclizine (ANTIVERT) 25 MG tablet Take 1 tablet (25 mg total) by mouth 3 (three) times daily as needed for dizziness. 30 tablet 0   Probiotic Product (PROBIOTIC PO) Take 1 tablet by mouth daily. Nature Bounty Acidophilus Probiotic     sildenafil (REVATIO) 20 MG tablet Take 3-5 tablets (60-100 mg total) by mouth daily as needed (intercourse). 30 tablet 1   No current facility-administered medications for this encounter.    ROS- All systems are reviewed and negative except as per the HPI above.  Physical Exam: Vitals:   10/01/23 0836  BP: (!) 110/90  Pulse: 76  Weight: 87.5 kg  Height: 5\' 10"  (1.778 m)     GEN: Well nourished, well developed in no  acute distress CARDIAC: Regular rate and rhythm, no murmurs, rubs, gallops RESPIRATORY:  Clear to auscultation without rales, wheezing or rhonchi  ABDOMEN: Soft, non-tender, non-distended EXTREMITIES:  No edema; No deformity    Wt Readings from Last 3 Encounters:  10/01/23 87.5 kg  06/09/23 93.7 kg  05/14/23 88 kg    EKG today demonstrates  SR Vent. rate 76 BPM PR interval 176 ms QRS duration 104 ms QT/QTcB 376/423 ms   Echo 04/08/20  demonstrated  1. Limited study to R/O pericardial effusion; no effusion noted.   2. Left ventricular ejection fraction, by estimation, is 50 to 55%. The  left ventricle has low normal function. The left ventricle has no regional  wall motion abnormalities. There is moderate left ventricular hypertrophy.  Left ventricular diastolic function could not be evaluated.   3. Right ventricular systolic function is normal. The right ventricular  size is normal.   4. The mitral valve is normal in structure. Trivial mitral valve  regurgitation. No evidence of mitral stenosis.   5. The aortic valve is tricuspid. Aortic valve regurgitation is not  visualized. Mild aortic valve sclerosis is present, with no evidence of  aortic valve stenosis.   6. The inferior vena cava is normal in size with greater than 50%  respiratory variability, suggesting right atrial pressure of 3 mmHg.   Epic records are reviewed at length today  CHA2DS2-VASc Score = 2  The patient's score is based upon: CHF History: 0 (EF normalized) HTN History: 1 Diabetes History: 0 Stroke History: 0 Vascular Disease History: 0 Age Score: 1 Gender Score: 0       ASSESSMENT AND PLAN: Persistent Atrial Fibrillation (ICD10:  I48.19) The patient's CHA2DS2-VASc score is 2, indicating a 2.2% annual risk of stroke.   S/p afib ablation 04/07/20 ILR shows 0.4% afib burden with a 3.5 hour episode on 09/14/23 We discussed his stroke risk and the risks and benefits of anticoagulation. He is agreeable to resuming Eliquis 5 mg BID. Samples provided today.  Will check cbc at follow up. Stop ASA. If he has more frequent afib, he may be a good candidate for PFA.   Secondary Hypercoagulable State (ICD10:  D68.69) The patient is at significant risk for stroke/thromboembolism based upon his CHA2DS2-VASc Score of 2.  Start Apixaban (Eliquis).   HTN Stable on current regimen  HFrecEF EF recovered with SR.  Fluid status appears stable  today    Follow up in the AF clinic in 6 weeks.    Jorja Loa PA-C Afib Clinic [facility] [address] [place], [place] [address] [phone] 10/01/2023 9:01 AM

## 2023-10-03 ENCOUNTER — Other Ambulatory Visit (HOSPITAL_COMMUNITY): Payer: Self-pay

## 2023-10-03 MED ORDER — APIXABAN 5 MG PO TABS: 5.0000 mg | ORAL_TABLET | Freq: Two times a day (BID) | ORAL | Status: DC

## 2023-10-27 ENCOUNTER — Ambulatory Visit (INDEPENDENT_AMBULATORY_CARE_PROVIDER_SITE_OTHER): Payer: BC Managed Care – PPO

## 2023-10-27 DIAGNOSIS — I4819 Other persistent atrial fibrillation: Secondary | ICD-10-CM

## 2023-10-27 LAB — CUP PACEART REMOTE DEVICE CHECK
Date Time Interrogation Session: 20250420230637
Implantable Pulse Generator Implant Date: 20220316

## 2023-10-28 ENCOUNTER — Encounter: Payer: Self-pay | Admitting: Cardiology

## 2023-11-12 ENCOUNTER — Ambulatory Visit (HOSPITAL_COMMUNITY)
Admission: RE | Admit: 2023-11-12 | Discharge: 2023-11-12 | Disposition: A | Discharge status: Home / Self Care | Payer: Self-pay | Source: Ambulatory Visit | Attending: Physician Assistant | Admitting: Physician Assistant

## 2023-11-12 ENCOUNTER — Encounter (HOSPITAL_COMMUNITY): Payer: Self-pay | Admitting: Physician Assistant

## 2023-11-12 VITALS — BP 160/110 | HR 62 | Ht 70.0 in | Wt 204.8 lb

## 2023-11-12 DIAGNOSIS — I10 Essential (primary) hypertension: Secondary | ICD-10-CM

## 2023-11-12 DIAGNOSIS — I4819 Other persistent atrial fibrillation: Secondary | ICD-10-CM

## 2023-11-12 NOTE — Progress Notes (Signed)
 Primary Care Physician: Scherrie Curt, MD Primary Electrophysiologist: Dr Marven Slimmer Referring Physician: Dr Shaaron Dar [name] is a 68 y.o. male with a history of systolic dysfunction, HTN, atrial fibrillation who presents for follow up in the [facility].  The patient was initially diagnosed with atrial fibrillation 01/2020 at a PCP office visit after presenting with symptoms of dyspnea with exertion. He had an echo which showed EF 40-45%. Seen by Dr Marven Slimmer on 03/06/20, started on Eliquis  for stroke prevention. He underwent afib ablation on 04/07/20. He underwent ILR implant on 09/20/20 for continued afib monitoring. His Eliquis  was discontinued. His ILR showed a 3.5 hour episode of afib on 3/9. He was unaware of his arrhythmia at the time. Eliquis  was resumed.   Patient returns for follow up for atrial fibrillation. His ILR shows no further episodes of afib. He feels well from a cardiac standpoint but continues to struggle with vertigo which has been chronic for >10 years. No bleeding issues on anticoagulation.   Today, he  denies symptoms of palpitations, chest pain, shortness of breath, orthopnea, PND, lower extremity edema, presyncope, syncope, snoring, daytime somnolence, bleeding, or neurologic sequela. The patient is tolerating medications without difficulties and is otherwise without complaint today.    Atrial Fibrillation Risk Factors:  he does not have symptoms or diagnosis of sleep apnea. he does not have a history of rheumatic fever.   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 04/2020 following Afib ablation.    Persistent atrial fibrillation (HCC)    a. 04/2020 s/p PVI.   Primary hypertension 02/06/2021    Current Outpatient Medications  Medication Sig Dispense Refill   acetaminophen  (TYLENOL ) 500 MG tablet Take 500 mg by mouth every 6 (six) hours as needed.     apixaban  (ELIQUIS ) 5 MG TABS tablet Take 1 tablet (5 mg total) by mouth 2 (two) times daily. 56 tablet    atorvastatin  (LIPITOR) 20 MG tablet TAKE 1 TABLET BY MOUTH EVERY DAY 90 tablet 3   cetirizine (ZYRTEC) 10 MG tablet Take 10 mg by mouth daily.     famotidine  (PEPCID ) 20 MG tablet Take 20 mg by mouth daily as needed for heartburn or indigestion.     FLUoxetine  (PROZAC ) 20 MG capsule Take 1 capsule (20 mg total) by mouth daily. 30 capsule 3   ibuprofen (ADVIL) 200 MG tablet Take 200 mg by mouth every 6 (six) hours as needed.     lisinopril  (ZESTRIL ) 40 MG tablet Take 1 tablet (40 mg total) by mouth daily. 90 tablet 3   meclizine  (ANTIVERT ) 25 MG tablet Take 1 tablet (25 mg total) by mouth 3 (three) times daily as needed for dizziness. 30 tablet 0   Probiotic Product (PROBIOTIC PO) Take 1 tablet by mouth daily. Nature Bounty Acidophilus Probiotic     sildenafil  (REVATIO ) 20 MG tablet Take 3-5 tablets (60-100 mg total) by mouth daily as needed (intercourse). 30 tablet 1   No current facility-administered medications for this encounter.    ROS- All systems are reviewed and negative except as per the HPI above.  Physical Exam: Vitals:   11/12/23 0821  BP: (!) 160/110  Pulse: 62  Weight:  92.9 kg  Height: 5\' 10"  (1.778 m)    GEN: Well nourished, well developed in no acute distress CARDIAC: Regular rate and rhythm, no murmurs, rubs, gallops RESPIRATORY:  Clear to auscultation without rales, wheezing or rhonchi  ABDOMEN: Soft, non-tender, non-distended EXTREMITIES:  No edema; No deformity    Wt Readings from Last 3 Encounters:  11/12/23 92.9 kg  10/01/23 87.5 kg  06/09/23 93.7 kg    EKG today demonstrates  SR Vent. rate 62  BPM PR interval 196 ms QRS duration 102 ms QT/QTcB 400/406 ms   Echo 04/08/20 demonstrated  1. Limited study to R/O pericardial effusion; no effusion noted.   2. Left ventricular ejection fraction, by estimation, is 50 to 55%. The  left ventricle has low normal function. The left ventricle has no regional  wall motion abnormalities. There is moderate left ventricular hypertrophy.  Left ventricular diastolic function could not be evaluated.   3. Right ventricular systolic function is normal. The right ventricular  size is normal.   4. The mitral valve is normal in structure. Trivial mitral valve  regurgitation. No evidence of mitral stenosis.   5. The aortic valve is tricuspid. Aortic valve regurgitation is not  visualized. Mild aortic valve sclerosis is present, with no evidence of  aortic valve stenosis.   6. The inferior vena cava is normal in size with greater than 50%  respiratory variability, suggesting right atrial pressure of 3 mmHg.   Epic records are reviewed at length today  CHA2DS2-VASc Score = 2  The patient's score is based upon: CHF History: 0 (EF normalized) HTN History: 1 Diabetes History: 0 Stroke History: 0 Vascular Disease History: 0 Age Score: 1 Gender Score: 0       ASSESSMENT AND PLAN: Persistent Atrial Fibrillation (ICD10:  I48.19) The patient's CHA2DS2-VASc score is 2, indicating a 2.2% annual risk of stroke.   S/p afib ablation 04/07/20 ILR shows no interim episodes of afib Continue Eliquis  5 mg BID Check cbc If he has more frequent afib, he may be a good candidate for PFA.  Secondary Hypercoagulable State (ICD10:  D68.69) The patient is at significant risk for stroke/thromboembolism based upon his CHA2DS2-VASc Score of 2.  Continue Apixaban  (Eliquis ). No bleeding issues.   HTN Elevated today, better controlled at home. Patient will keep BP log for review. No changes today.   HFrecEF EF recovered with SR Fluid status appears stable  today    Follow up in the AF clinic in 6 months.    Myrtha Ates PA-C Afib [facility] [address] [place], [place] [address] [phone] 11/12/2023 8:31 AM

## 2023-11-12 NOTE — Progress Notes (Signed)
 [facility] Summary Report / Loop Recorder

## 2023-11-12 NOTE — Addendum Note (Signed)
 Addended by: Edra Govern D on: 11/12/2023 02:24 PM   Modules accepted: Orders

## 2023-11-13 LAB — CBC
Hematocrit: 39.3 % (ref 37.5–51.0)
Hemoglobin: 13.6 g/dL (ref 13.0–17.7)
MCH: 31.5 pg (ref 26.6–33.0)
MCHC: 34.6 g/dL (ref 31.5–35.7)
MCV: 91 fL (ref 79–97)
Platelets: 203 10*3/uL (ref 150–450)
RBC: 4.32 x10E6/uL (ref 4.14–5.80)
RDW: 12.4 % (ref 11.6–15.4)
WBC: 5.3 10*3/uL (ref 3.4–10.8)

## 2023-11-20 ENCOUNTER — Other Ambulatory Visit (HOSPITAL_COMMUNITY): Payer: Self-pay

## 2023-11-20 MED ORDER — APIXABAN 5 MG PO TABS: 5.0000 mg | ORAL_TABLET | Freq: Two times a day (BID) | ORAL | Status: DC

## 2023-12-02 ENCOUNTER — Ambulatory Visit (INDEPENDENT_AMBULATORY_CARE_PROVIDER_SITE_OTHER): Payer: BC Managed Care – PPO

## 2023-12-02 DIAGNOSIS — I4819 Other persistent atrial fibrillation: Secondary | ICD-10-CM

## 2023-12-03 ENCOUNTER — Ambulatory Visit: Payer: Self-pay | Admitting: Cardiology

## 2023-12-03 LAB — CUP PACEART REMOTE DEVICE CHECK
Date Time Interrogation Session: 20250526231011
Implantable Pulse Generator Implant Date: 20220316

## 2023-12-17 ENCOUNTER — Other Ambulatory Visit: Payer: Self-pay | Admitting: Cardiology

## 2023-12-17 DIAGNOSIS — I4819 Other persistent atrial fibrillation: Secondary | ICD-10-CM

## 2023-12-17 DIAGNOSIS — I10 Essential (primary) hypertension: Secondary | ICD-10-CM

## 2023-12-17 NOTE — Progress Notes (Signed)
 [facility] Summary Report / Loop Recorder

## 2023-12-25 ENCOUNTER — Other Ambulatory Visit (HOSPITAL_COMMUNITY): Payer: Self-pay

## 2023-12-25 MED ORDER — APIXABAN 5 MG PO TABS: 5.0000 mg | ORAL_TABLET | Freq: Two times a day (BID) | ORAL | Status: DC

## 2024-01-01 ENCOUNTER — Ambulatory Visit: Payer: Self-pay | Admitting: Cardiology

## 2024-01-01 ENCOUNTER — Ambulatory Visit: Payer: Self-pay

## 2024-01-01 DIAGNOSIS — I4819 Other persistent atrial fibrillation: Secondary | ICD-10-CM

## 2024-01-01 LAB — CUP PACEART REMOTE DEVICE CHECK
Date Time Interrogation Session: 20250625230826
Implantable Pulse Generator Implant Date: 20220316

## 2024-01-26 NOTE — Progress Notes (Signed)
 [facility] Summary Report / Loop Recorder

## 2024-01-26 NOTE — Addendum Note (Signed)
 Addended by: TAWNI DRILLING D on: 01/26/2024 10:02 AM   Modules accepted: Orders

## 2024-02-02 ENCOUNTER — Ambulatory Visit (INDEPENDENT_AMBULATORY_CARE_PROVIDER_SITE_OTHER): Payer: Medicare (Managed Care)

## 2024-02-02 DIAGNOSIS — I4819 Other persistent atrial fibrillation: Secondary | ICD-10-CM | POA: Diagnosis not present

## 2024-02-02 LAB — CUP PACEART REMOTE DEVICE CHECK
Date Time Interrogation Session: 20250727231101
Implantable Pulse Generator Implant Date: 20220316

## 2024-02-09 ENCOUNTER — Ambulatory Visit: Payer: Self-pay | Admitting: Cardiology

## 2024-03-04 ENCOUNTER — Ambulatory Visit: Payer: Self-pay

## 2024-03-04 DIAGNOSIS — I4819 Other persistent atrial fibrillation: Secondary | ICD-10-CM

## 2024-03-04 LAB — CUP PACEART REMOTE DEVICE CHECK
Date Time Interrogation Session: 20250827230943
Implantable Pulse Generator Implant Date: 20220316

## 2024-03-05 ENCOUNTER — Ambulatory Visit: Payer: Self-pay | Admitting: Cardiology

## 2024-03-18 NOTE — Progress Notes (Signed)
 Remote Loop Recorder Transmission

## 2024-03-18 NOTE — Progress Notes (Signed)
 [facility] Summary Report / Loop Recorder

## 2024-03-21 NOTE — Progress Notes (Signed)
 "    Dylan Tonnesen T. Lejla Moeser, MD, CAQ Sports Medicine [facility] [address] [place], [address]  Phone: [phone]  FAX: [phone]  Dylan Olsen - 68 y.o. male  MRN [id number]  Date of Birth: 02/29/1956  Date: 03/24/2024  PCP: [name], MD  Referral: [name], MD  Chief Complaint  Patient presents with   Medicare Wellness   Patient Care Team: [name], MD as PCP - General (Family Medicine) Cindie Ole DASEN, MD as PCP - Electrophysiology (Cardiology) Onetha Kuba, MD as Consulting Physician (Neurosurgery) Subjective:   Dylan Olsen is a 68 y.o. pleasant patient who presents for a medicare wellness examination:  Preventative Health Maintenance Visit:  Health Maintenance Summary Reviewed and updated, unless pt declines services.  Tobacco History Reviewed. Alcohol: No concerns, no excessive use Exercise Habits: Some activity, rec at least 30 mins 5 times a week STD concerns: no risk or activity to increase risk Drug Use: None  Shingrix Flu Prevnar-20 Covid booster - At this point he declines all vaccination  Advil and Eliquis  -hold off on this   Health Maintenance  Topic Date Due   COVID-19 Vaccine (4 - 2025-26 season) 03/08/2024   Zoster Vaccines- Shingrix (1 of 2) 06/23/2024 (Originally 11/24/2005)   Influenza Vaccine  10/05/2024 (Originally 02/06/2024)   Pneumococcal Vaccine: 50+ Years (1 of 2 - PCV) 03/24/2025 (Originally 11/25/1974)   Colonoscopy  05/13/2026   HPV VACCINES  Aged Out   Meningococcal B Vaccine  Aged Out   DTaP/Tdap/Td  Discontinued   Hepatitis C Screening  Discontinued    Discussed the use of AI scribe software for clinical note transcription with the patient, who gave verbal consent to proceed.  History of Present Illness Dylan Olsen is a 68 year old male with atrial fibrillation who presents with chest pain and shortness of breath.  Approximately two weeks ago, he  experienced a severe episode of chest pain described as a 'really bad pain in my heart' that radiated to the chest and was accompanied by shortness of breath. The episode lasted about 30 minutes and resolved after he laid down. Since then, he has not had a similar episode but notes increased shortness of breath during simple tasks like taking the garbage can up the driveway. - Additional cardiac risk includes hypertension and hyperlipidemia.  He is no longer smoking cigars.  He reports being on Eliquis  and has been told he has atrial fibrillation. He experiences occasional palpitations described as 'fluttery' sensations occurring a couple of times a week, sometimes felt in his throat. He recalls having a stress test about 20 years ago and an echocardiogram about four years ago.  He frequently experiences dizziness, particularly when lying down or getting up, and also when looking up or down.  This is chronic in nature.  He has a history of depression and was previously on Prozac , which he stopped about 8-10 months ago without recalling a significant change in symptoms. His wife notes increased forgetfulness since having COVID-19 about a year ago, which led to his retirement. He also experienced the loss of a pet, which affected him deeply.  He occasionally smokes marijuana a couple of times a week and drinks alcohol infrequently, about once or twice a year. He does not engage in regular exercise but attempts to walk his dogs about once a week.  He has been non-compliant with his medication regimen in the past, leading his wife to manage  his medications for the past 9-10 months.    Immunization History  Administered Date(s) Administered   PFIZER(Purple Top)SARS-COV-2 Vaccination 01/08/2020, 01/29/2020, 01/27/2021    Patient Active Problem List   Diagnosis Date Noted   Persistent atrial fibrillation (HCC) 01/13/2020    Priority: High   Chronic vertigo 02/20/2023    Priority: Medium    Primary  hypertension 02/06/2021    Priority: Medium    Mixed hyperlipidemia 04/09/2020    Priority: Medium    Cigar smoker 02/06/2021    Priority: Low   GERD (gastroesophageal reflux disease) 01/13/2020    Priority: Low   Erectile dysfunction 01/13/2020    Priority: Low   Hypercoagulable state due to persistent atrial fibrillation (HCC) 10/01/2023   Seasonal allergies 01/13/2020    Past Medical History:  Diagnosis Date   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 04/2020 following Afib ablation.   Persistent atrial fibrillation (HCC)    a. 04/2020 s/p PVI.   Primary hypertension 02/06/2021    Past Surgical History:  Procedure Laterality Date   ATRIAL FIBRILLATION ABLATION N/A 04/07/2020   Procedure: ATRIAL FIBRILLATION ABLATION;  Surgeon: Cindie Ole DASEN, MD;  Location: [facility] INVASIVE CV LAB;  Service: Cardiovascular;  Laterality: N/A;   BACK SURGERY     BUBBLE STUDY  04/06/2020   Procedure: BUBBLE STUDY;  Surgeon: Santo Stanly LABOR, MD;  Location: [facility] ENDOSCOPY;  Service: Cardiovascular;;   FOOT SURGERY     KNEE SURGERY Left    TEE WITHOUT CARDIOVERSION N/A 04/06/2020   Procedure: TRANSESOPHAGEAL ECHOCARDIOGRAM (TEE);  Surgeon: Santo Stanly LABOR, MD;  Location: [facility] ENDOSCOPY;  Service: Cardiovascular;  Laterality: N/A;    Family History  Problem Relation Age of Onset   Heart disease Father    Heart attack Father 55   Cancer Cousin        unknown primary   Stomach cancer Neg Hx    Rectal cancer Neg Hx    Kidney disease Neg Hx    Colon cancer Neg Hx    Colon polyps Neg Hx     Social History   Social History Narrative   01/13/20   From: [place], [place] originally - moved to [place] 1996   Living: with wife [name] (1996), and step daughter (special needs)   Work: english as a second language teacher for [employer]      Family: 2 children - Gustav and Kennard - in [place], good  relationship and 3 grandchildren and 2 step children      Enjoys: drag racing, football      Exercise: not currently - walking and mobile job   Diet: not great, sometimes a little better      Safety   Seat belts: Yes    Guns: Yes  and secure   Safe in relationships: Yes     Past Medical History, Surgical History, Social History, Family History, Problem List, Medications, and Allergies have been reviewed and updated if relevant.  Review of Systems: Pertinent positives are listed above.  Otherwise, a full 14 point review of systems has been done in full and it is negative except where it is noted positive.  Objective:   Pulse 63   Temp (!) 97.4 F (36.3 C) (Temporal)   Ht 5' 10.25 (1.784 m)   Wt 193 lb 6 oz (87.7 kg)   SpO2 99%   BMI 27.55 kg/m  04/08/2020    7:30 PM 04/09/2020   10:00 AM 08/21/2022    3:49 PM 02/19/2023    3:57 PM 03/24/2024   10:54 AM  Fall Risk  Falls in the past year?   0 0 0  Was there an injury with Fall?   0 0 0  Fall Risk Category Calculator   0 0 0  (RETIRED) Patient Fall Risk Level Moderate fall risk  Moderate fall risk      Patient at Risk for Falls Due to   No Fall Risks No Fall Risks No Fall Risks  Fall risk Follow up   Falls evaluation completed Falls evaluation completed Falls evaluation completed     Data saved with a previous flowsheet row definition   Ideal Body Weight: Weight in (lb) to have BMI = 25: 175.1 Hearing Screening  Method: Audiometry   500Hz  1000Hz  2000Hz  4000Hz   Right ear 20 20 20 20   Left ear 20 20 20 20   Vision Screening - Comments:: Wears Readers.  No Eye Exam in past year    03/24/2024   10:55 AM 02/19/2023    3:58 PM 02/12/2022    9:23 AM 02/06/2021    3:44 PM 01/13/2020    4:28 PM  Depression screen PHQ 2/9  Decreased Interest 1 0 0 0 0  Down, Depressed, Hopeless 1 0 0 0 0  PHQ - 2 Score 2 0 0 0 0  Altered sleeping 1      Tired, decreased energy 1      Change in appetite 1      Feeling bad or failure about  yourself  0      Trouble concentrating 1      Moving slowly or fidgety/restless 0      Suicidal thoughts 0      PHQ-9 Score 6      Difficult doing work/chores Somewhat difficult         GEN: well developed, well nourished, no acute distress Eyes: conjunctiva and lids normal, PERRLA, EOMI ENT: TM clear, nares clear, oral exam WNL Neck: supple, no lymphadenopathy, no thyromegaly, no JVD Pulm: clear to auscultation and percussion, respiratory effort normal CV: regular rate and rhythm, S1-S2, no murmur, rub or gallop, no bruits, peripheral pulses normal and symmetric, no cyanosis, clubbing, edema or varicosities GI: soft, non-tender; no hepatosplenomegaly, masses; active bowel sounds all quadrants GU: deferred Lymph: no cervical, axillary or inguinal adenopathy MSK: gait normal, muscle tone and strength WNL, no joint swelling, effusions, discoloration, crepitus  SKIN: clear, good turgor, color WNL, no rashes, lesions, or ulcerations Neuro: normal mental status, normal strength, sensation, and motion Psych: alert; oriented to person, place and time, normally interactive and not anxious or depressed in appearance.  All labs reviewed with patient.  Results for orders placed or performed in visit on 03/04/24  CUP PACEART REMOTE DEVICE CHECK   Collection Time: 03/03/24 11:09 PM  Result Value Ref Range   Date Time Interrogation Session [id number]    Pulse Generator Manufacturer MERM    Pulse Gen Model [id number] GARR HEATH    Pulse Gen Serial Number [id number]    Clinic Name [facility]    Implantable Pulse Generator Type ICM/ILR    Implantable Pulse Generator Implant Date [id number]     Assessment and Plan:     ICD-10-CM   1. Healthcare maintenance  Z00.00     2. Primary hypertension  I10       Assessment and Plan Assessment & Plan Chest  pain and shortness of breath  Adult Wellness Visit Routine wellness visit. Discussed general health maintenance, vaccinations, and lifestyle  modifications. - Encouraged regular physical activity, such as walking the dogs more frequently. He declines all vaccination  Additional 20 minutes spent on evaluation of acute issues including chest pain, and chronic depression that has worsened over the last 9 to 10 months off of medication.   Chest pain and shortness of breath Intermittent substernal chest pain with exertional dyspnea. Recent severe episode. Differential includes anxiety, but further evaluation needed due to age and atrial fibrillation as well as additional cardiac risk factors. - Order EKG to evaluate cardiac function. Concern for potential ischemic heart disease.  He does have an electrophysiologist, but he has not had any kind of stress test or ischemic workout in roughly 20 years.  I am going to consult general cardiology to help evaluate the patient.  EKG: Normal sinus rhythm. Normal axis, normal R wave progression, No acute ST elevation or depression.   Dizziness Chronic dizziness with positional changes, suggesting postural component.  Depression Chronic depression over a year. Previous fluoxetine  treatment perceived as beneficial. Exacerbated by medication non-compliance and recent pet loss. Discussed recurrence likelihood with multiple episodes. - Restart fluoxetine  (Prozac ) 20 mg every other day for 10 days, then increase to daily. - Ensure medication compliance using pill boxes.  Primary hypertension Slightly elevated blood pressure. Continue current medications.  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   Health Maintenance Exam: The patient's preventative maintenance and recommended screening tests for an annual wellness exam were reviewed in full today. Brought up to date unless services declined.  Counselled on the importance of diet, exercise, and its role in overall health and mortality. The patient's FH and SH was reviewed,  including their home life, tobacco status, and drug and alcohol status.  Follow-up in 1 year for physical exam or additional follow-up below.  I have personally reviewed the Medicare Annual Wellness questionnaire and have noted 1. The patient's medical and social history 2. Their use of alcohol, tobacco or illicit drugs 3. Their current medications and supplements 4. The patient's functional ability including ADL's, fall risks, home safety risks and hearing or visual             impairment. 5. Diet and physical activities 6. Evidence for depression or mood disorders 7. Reviewed Updated provider list, see scanned forms and [facility] Snapshot.  8. Reviewed whether or not the patient has HCPOA or living will, and discussed what this means with the patient.  Recommended he bring in a copy for his chart in [facility].  The patients weight, height, BMI and visual acuity have been recorded in the chart I have made referrals, counseling and provided education to the patient based review of the above and I have provided the pt with a written personalized care plan for preventive services.  I have provided the patient with a copy of your personalized plan for preventive services. Instructed to take the time to review along with their updated medication list.  Disposition: No follow-ups on file.  Future Appointments  Date Time Provider Department Center  04/05/2024  7:25 AM [facility] HVT DEVICE REMOTES [facility]  05/06/2024  7:20 AM [facility] DEVICE REMOTES [facility]  06/07/2024  7:20 AM [facility] DEVICE REMOTES [facility]  07/08/2024  7:20 AM [facility] DEVICE REMOTES [facility]    No orders of the defined types were placed in this  encounter.  Medications Discontinued During This Encounter  Medication Reason   sildenafil  (REVATIO ) 20 MG tablet Completed Course   Probiotic Product (PROBIOTIC PO) Completed Course   FLUoxetine  (PROZAC ) 20 MG capsule Completed Course   No orders of the defined types were  placed in this encounter.   Signed,  Jacques DASEN. Shanah Guimaraes, MD   Allergies as of 03/24/2024       Reactions   Other Other (See Comments)   Pt reports he coded after having anesthesia with a back surgery  Can not recall what medicine        Medication List        Accurate as of March 24, 2024 11:00 AM. If you have any questions, ask your nurse or doctor.          STOP taking these medications    FLUoxetine  20 MG capsule Commonly known as: PROZAC    PROBIOTIC PO   sildenafil  20 MG tablet Commonly known as: REVATIO        TAKE these medications    acetaminophen  500 MG tablet Commonly known as: TYLENOL  Take 500 mg by mouth every 6 (six) hours as needed.   Advil 200 MG tablet Generic drug: ibuprofen Take 200 mg by mouth every 6 (six) hours as needed.   apixaban  5 MG Tabs tablet Commonly known as: Eliquis  Take 1 tablet (5 mg total) by mouth 2 (two) times daily.   atorvastatin  20 MG tablet Commonly known as: LIPITOR TAKE 1 TABLET BY MOUTH EVERY DAY   cetirizine 10 MG tablet Commonly known as: ZYRTEC Take 10 mg by mouth daily.   famotidine  20 MG tablet Commonly known as: PEPCID  Take 20 mg by mouth daily as needed for heartburn or indigestion.   lisinopril  40 MG tablet Commonly known as: ZESTRIL  TAKE 1 TABLET BY MOUTH EVERY DAY   meclizine  25 MG tablet Commonly known as: ANTIVERT  Take 1 tablet (25 mg total) by mouth 3 (three) times daily as needed for dizziness.        "

## 2024-03-24 ENCOUNTER — Ambulatory Visit (INDEPENDENT_AMBULATORY_CARE_PROVIDER_SITE_OTHER): Payer: Medicare (Managed Care) | Admitting: Family Medicine

## 2024-03-24 ENCOUNTER — Ambulatory Visit (INDEPENDENT_AMBULATORY_CARE_PROVIDER_SITE_OTHER)
Admission: RE | Admit: 2024-03-24 | Discharge: 2024-03-24 | Disposition: A | Discharge status: Home / Self Care | Payer: Medicare (Managed Care) | Source: Ambulatory Visit | Attending: Family Medicine | Admitting: Family Medicine

## 2024-03-24 VITALS — BP 120/90 | HR 63 | Temp 97.4°F | Ht 70.25 in | Wt 193.4 lb

## 2024-03-24 DIAGNOSIS — D6869 Other thrombophilia: Secondary | ICD-10-CM

## 2024-03-24 DIAGNOSIS — I4819 Other persistent atrial fibrillation: Secondary | ICD-10-CM | POA: Diagnosis not present

## 2024-03-24 DIAGNOSIS — Z0001 Encounter for general adult medical examination with abnormal findings: Secondary | ICD-10-CM | POA: Diagnosis not present

## 2024-03-24 DIAGNOSIS — R0609 Other forms of dyspnea: Secondary | ICD-10-CM | POA: Diagnosis not present

## 2024-03-24 DIAGNOSIS — Z125 Encounter for screening for malignant neoplasm of prostate: Secondary | ICD-10-CM | POA: Diagnosis not present

## 2024-03-24 DIAGNOSIS — E782 Mixed hyperlipidemia: Secondary | ICD-10-CM | POA: Diagnosis not present

## 2024-03-24 DIAGNOSIS — Z0000 Encounter for general adult medical examination without abnormal findings: Secondary | ICD-10-CM

## 2024-03-24 DIAGNOSIS — R0600 Dyspnea, unspecified: Secondary | ICD-10-CM

## 2024-03-24 DIAGNOSIS — R5383 Other fatigue: Secondary | ICD-10-CM | POA: Diagnosis not present

## 2024-03-24 DIAGNOSIS — R079 Chest pain, unspecified: Secondary | ICD-10-CM

## 2024-03-24 DIAGNOSIS — Z131 Encounter for screening for diabetes mellitus: Secondary | ICD-10-CM | POA: Diagnosis not present

## 2024-03-24 DIAGNOSIS — Z13220 Encounter for screening for lipoid disorders: Secondary | ICD-10-CM

## 2024-03-24 DIAGNOSIS — I10 Essential (primary) hypertension: Secondary | ICD-10-CM | POA: Diagnosis not present

## 2024-03-24 DIAGNOSIS — F331 Major depressive disorder, recurrent, moderate: Secondary | ICD-10-CM

## 2024-03-24 LAB — CBC WITH DIFFERENTIAL/PLATELET
Basophils Absolute: 0.1 K/uL (ref 0.0–0.1)
Basophils Relative: 1.1 % (ref 0.0–3.0)
Eosinophils Absolute: 0.2 K/uL (ref 0.0–0.7)
Eosinophils Relative: 2.8 % (ref 0.0–5.0)
HCT: 42.9 % (ref 39.0–52.0)
Hemoglobin: 15.1 g/dL (ref 13.0–17.0)
Lymphocytes Relative: 25 % (ref 12.0–46.0)
Lymphs Abs: 1.7 K/uL (ref 0.7–4.0)
MCHC: 35.2 g/dL (ref 30.0–36.0)
MCV: 87.9 fl (ref 78.0–100.0)
Monocytes Absolute: 0.5 K/uL (ref 0.1–1.0)
Monocytes Relative: 8 % (ref 3.0–12.0)
Neutro Abs: 4.2 K/uL (ref 1.4–7.7)
Neutrophils Relative %: 63.1 % (ref 43.0–77.0)
Platelets: 226 K/uL (ref 150.0–400.0)
RBC: 4.88 Mil/uL (ref 4.22–5.81)
RDW: 12.8 % (ref 11.5–15.5)
WBC: 6.6 K/uL (ref 4.0–10.5)

## 2024-03-24 LAB — BASIC METABOLIC PANEL WITH GFR
BUN: 10 mg/dL (ref 6–23)
CO2: 29 meq/L (ref 19–32)
Calcium: 9.7 mg/dL (ref 8.4–10.5)
Chloride: 101 meq/L (ref 96–112)
Creatinine, Ser: 0.74 mg/dL (ref 0.40–1.50)
GFR: 93.22 mL/min (ref >60.00)
Glucose, Bld: 106 mg/dL — ABNORMAL HIGH (ref 70–99)
Potassium: 4 meq/L (ref 3.5–5.1)
Sodium: 140 meq/L (ref 135–145)

## 2024-03-24 LAB — LIPID PANEL
Cholesterol: 168 mg/dL (ref 0–200)
HDL: 38.7 mg/dL — ABNORMAL LOW (ref >39.00)
LDL Cholesterol: 63 mg/dL (ref 0–99)
NonHDL: 129.07
Total CHOL/HDL Ratio: 4
Triglycerides: 332 mg/dL — ABNORMAL HIGH (ref 0.0–149.0)
VLDL: 66.4 mg/dL — ABNORMAL HIGH (ref 0.0–40.0)

## 2024-03-24 LAB — HEPATIC FUNCTION PANEL
ALT: 33 U/L (ref 0–53)
AST: 23 U/L (ref 0–37)
Albumin: 5 g/dL (ref 3.5–5.2)
Alkaline Phosphatase: 83 U/L (ref 39–117)
Bilirubin, Direct: 0.2 mg/dL (ref 0.0–0.3)
Total Bilirubin: 0.8 mg/dL (ref 0.2–1.2)
Total Protein: 7.4 g/dL (ref 6.0–8.3)

## 2024-03-24 LAB — PSA, MEDICARE: PSA: 0.83 ng/mL (ref 0.10–4.00)

## 2024-03-24 LAB — D-DIMER, QUANTITATIVE: D-Dimer, Quant: 0.19 ug{FEU}/mL (ref <0.50)

## 2024-03-24 LAB — TSH: TSH: 2.59 u[IU]/mL (ref 0.35–5.50)

## 2024-03-24 LAB — HEMOGLOBIN A1C: Hgb A1c MFr Bld: 5.6 % (ref 4.6–6.5)

## 2024-03-24 LAB — VITAMIN B12: Vitamin B-12: 253 pg/mL (ref 211–911)

## 2024-03-24 MED ORDER — FLUOXETINE HCL 20 MG PO CAPS
20.0000 mg | ORAL_CAPSULE | Freq: Every day | ORAL | 3 refills | Status: DC
Start: 2024-03-24 — End: 2024-04-19

## 2024-03-24 NOTE — Patient Instructions (Signed)
 Take Prozac  20 mg every other day for 10 days, then increase to every day.

## 2024-03-25 ENCOUNTER — Encounter: Payer: Self-pay | Admitting: Family Medicine

## 2024-03-25 ENCOUNTER — Ambulatory Visit: Payer: Self-pay | Admitting: Family Medicine

## 2024-03-26 ENCOUNTER — Ambulatory Visit: Payer: Medicare (Managed Care) | Attending: Student | Admitting: Student

## 2024-03-26 ENCOUNTER — Encounter: Payer: Self-pay | Admitting: Student

## 2024-03-26 VITALS — BP 153/91 | HR 63 | Ht 70.0 in | Wt 192.0 lb

## 2024-03-26 DIAGNOSIS — R072 Precordial pain: Secondary | ICD-10-CM

## 2024-03-26 DIAGNOSIS — I10 Essential (primary) hypertension: Secondary | ICD-10-CM | POA: Diagnosis not present

## 2024-03-26 DIAGNOSIS — D6869 Other thrombophilia: Secondary | ICD-10-CM

## 2024-03-26 DIAGNOSIS — I4819 Other persistent atrial fibrillation: Secondary | ICD-10-CM

## 2024-03-26 MED ORDER — NITROGLYCERIN 0.4 MG SL SUBL: 0.4000 mg | SUBLINGUAL_TABLET | SUBLINGUAL | 3 refills | Status: AC | PRN

## 2024-03-26 MED ORDER — METOPROLOL TARTRATE 100 MG PO TABS: ORAL_TABLET | ORAL | 0 refills | Status: DC

## 2024-03-26 NOTE — Progress Notes (Signed)
   Electrophysiology Office Note:   Date:  03/26/2024  ID:  Dylan Olsen, Dylan Olsen December 28, 1955, MRN [id number]  Primary Cardiologist: None Electrophysiologist: OLE ONEIDA HOLTS, MD      History of Present Illness:   Dylan Olsen is a 68 y.o. male with h/o CHF, HTN, and AF s/p PVI and loop recorder seen today for routine electrophysiology followup.   Since last being seen in our clinic the patient reports doing symptoms worrisome for angina.   Earlier this week he was rolling the trash cans up a hill when he had left sided, squeezing chest pain that radiated into his arm.  He laid down and had to rest for about 30 minutes prior to symptoms resolving. He has had multiple other episodes with exertion, most lasting 5-12 minutes. He his a prior smoker, and intermittently uses cigars. Otherwise, no syncope or edema. Does occasionally have palpitations, as recently as this week.  Nothing caught on loop.    Review of systems complete and found to be negative unless listed in HPI.   Studies Reviewed:    EKG is not ordered today. EKG from 03/24/2024 reviewed which showed NSR        Arrhythmia/Device History S/p PVI 04/2020 MDT ILR 09/2020 for AF   Physical Exam:   VS:  BP (!) 153/91 (BP Location: Left Arm, Patient Position: Sitting)   Pulse 63   Ht 5' 10 (1.778 m)   Wt 192 lb (87.1 kg)   SpO2 98%   BMI 27.55 kg/m    Wt Readings from Last 3 Encounters:  03/26/24 192 lb (87.1 kg)  03/24/24 193 lb 6 oz (87.7 kg)  11/12/23 204 lb 12.8 oz (92.9 kg)     GEN: No acute distress NECK: No JVD; No carotid bruits CARDIAC: Regular rate and rhythm, no murmurs, rubs, gallops RESPIRATORY:  Clear to auscultation without rales, wheezing or rhonchi  ABDOMEN: Soft, non-tender, non-distended EXTREMITIES:  No edema; No deformity   ILR Interrogation- reviewed in detail today,  See PACEART report  ASSESSMENT AND PLAN:    Atrial fibrillation s/p Medtronic Loop recorder S/p ablation 2021 Low  burden. None since July Normal device function See Elisabeth Art report No changes today Continue eliquis  5 mg BID for CHA2DS2/VASc of 2   Secondary hypercoagulable state Pt on Eliquis  as above   HFrecEF EF recovered. Potentially tachy-mediated No symptoms or concerns warranting recheck at this time.  Anginal chest pain Coronary CT ASAP NTG to use prn. Strict instructions given. If 3 doses or symptoms > 10 minutes, call 911.  Update Echo as well.  We discussed indication, risk, and benefits of heart catheterization including bleeding, infection, heart attack, kidney failure, stroke, and death. He verbalized understanding that if the CT shows significant disease, we would quickly set him up for cath.   Follow up with EP APP in 4 weeks. Will quickly arrange cath as above if CT positive.   Signed, Ozell Prentice Passey, PA-C

## 2024-03-26 NOTE — Patient Instructions (Signed)
 Medication Instructions:  Nitroglycerin  .4 mgsl place one tablet underneath your tongue as needed for chest pain  Metoprolol  Tartrate 100 mg take 1 tablet 2 hours prior to cardiac CT *If you need a refill on your cardiac medications before your next appointment, please call your pharmacy*  Lab Work: NONE ordered at this time of appointment   Testing/Procedures: Your physician has requested that you have an echocardiogram. Echocardiography is a painless test that uses sound waves to create images of your heart. It provides your doctor with information about the size and shape of your heart and how well your heart's chambers and valves are working. This procedure takes approximately one hour. There are no restrictions for this procedure. Please do NOT wear cologne, perfume, aftershave, or lotions (deodorant is allowed). Please arrive 15 minutes prior to your appointment time.  Please note: We ask at that you not bring children with you during ultrasound (echo/ vascular) testing. Due to room size and safety concerns, children are not allowed in the ultrasound rooms during exams. Our front office staff cannot provide observation of children in our lobby area while testing is being conducted. An adult accompanying a patient to their appointment will only be allowed in the ultrasound room at the discretion of the ultrasound technician under special circumstances. We apologize for any inconvenience.    Your cardiac CT will be scheduled at one of the below locations:   [facility] [address]  [place], [place] [address] [phone]  If scheduled at the Heart and Vascular Tower at [facility] street, please enter the parking lot using the [address] entrance and use the FREE valet service at the patient drop-off area. Enter the building and check-in with registration on the main floor.  An IV will be required for this test and Nitroglycerin  will be given.  Hold  all erectile dysfunction medications at least 3 days (72 hrs) prior to test. (Ie viagra , cialis, sildenafil , tadalafil, etc)   On the Night Before the Test: Be sure to Drink plenty of water. Do not consume any caffeinated/decaffeinated beverages or chocolate 12 hours prior to your test. Do not take any antihistamines 12 hours prior to your test.  If the patient has contrast allergy: Patient will need a prescription for Prednisone  and very clear instructions (as follows): Prednisone  50 mg - take 13 hours prior to test Take another Prednisone  50 mg 7 hours prior to test Take another Prednisone  50 mg 1 hour prior to test Take Benadryl 50 mg 1 hour prior to test Patient must complete all four doses of above prophylactic medications. Patient will need a ride after test due to Benadryl.  On the Day of the Test: Drink plenty of water until 1 hour prior to the test. Do not eat any food 1 hour prior to test. You may take your regular medications prior to the test.  Take metoprolol  (Lopressor ) two hours prior to test. If you take Furosemide/Hydrochlorothiazide /Spironolactone/Chlorthalidone, please HOLD on the morning of the test. Patients who wear a continuous glucose monitor MUST remove the device prior to scanning. FEMALES- please wear underwire-free bra if available, avoid dresses & tight clothing  *For Clinical Staff only. Please instruct patient the following:*       After the Test: Drink plenty of water. After receiving IV contrast, you may experience a mild flushed feeling. This is normal. On occasion, you may experience a mild rash up to 24 hours after the test. This is not dangerous.  If this occurs, you can take Benadryl 25 mg, Zyrtec, Claritin, or Allegra and increase your fluid intake. (Patients taking Tikosyn should avoid Benadryl, and may take Zyrtec, Claritin, or Allegra) If you experience trouble breathing, this can be serious. If it is severe call 911 IMMEDIATELY. If it is mild,  please call our office.  We will call to schedule your test 2-4 weeks out understanding that some insurance companies will need an authorization prior to the service being performed.   For more information and frequently asked questions, please visit our website : [email or web address]  For non-scheduling related questions, please contact the cardiac imaging nurse navigator should you have any questions/concerns: Cardiac Imaging Nurse Navigators Direct Office Dial: [phone]   For scheduling needs, including cancellations and rescheduling, please call [place], [phone].   Follow-Up: At [facility], you and your health needs are our priority.  As part of our continuing mission to provide you with exceptional heart care, our providers are all part of one team.  This team includes your primary Cardiologist (physician) and Advanced Practice Providers or APPs (Physician Assistants and Nurse Practitioners) who all work together to provide you with the care you need, when you need it.  Your next appointment:   4 week(s)  Provider:   Prentice Passey PA    We recommend signing up for the patient portal called MyChart.  Sign up information is provided on this After Visit Summary.  MyChart is used to connect with patients for Virtual Visits (Telemedicine).  Patients are able to view lab/test results, encounter notes, upcoming appointments, etc.  Non-urgent messages can be sent to your provider as well.   To learn more about what you can do with MyChart, go to [email or web address].   Other Instructions 0

## 2024-03-31 ENCOUNTER — Telehealth (HOSPITAL_COMMUNITY): Payer: Self-pay | Admitting: *Deleted

## 2024-03-31 NOTE — Telephone Encounter (Signed)
 Reaching out to patient to offer assistance regarding upcoming cardiac imaging study; pt verbalizes understanding of appt date/time, parking situation and where to check in, pre-test NPO status and medications ordered, and verified current allergies; name and call back number provided for further questions should they arise Sid Seats RN Navigator Cardiac Imaging [facility] [phone] office [phone] cell

## 2024-04-01 ENCOUNTER — Ambulatory Visit (HOSPITAL_COMMUNITY)
Admission: RE | Admit: 2024-04-01 | Discharge: 2024-04-01 | Disposition: A | Discharge status: Home / Self Care | Payer: Medicare (Managed Care) | Source: Ambulatory Visit | Attending: Student | Admitting: Student

## 2024-04-01 ENCOUNTER — Ambulatory Visit (HOSPITAL_COMMUNITY)
Admission: RE | Admit: 2024-04-01 | Discharge: 2024-04-01 | Disposition: A | Discharge status: Home / Self Care | Payer: Medicare (Managed Care) | Source: Ambulatory Visit | Attending: Cardiology | Admitting: Cardiology

## 2024-04-01 ENCOUNTER — Other Ambulatory Visit: Payer: Self-pay | Admitting: Cardiology

## 2024-04-01 DIAGNOSIS — R072 Precordial pain: Secondary | ICD-10-CM | POA: Diagnosis not present

## 2024-04-01 DIAGNOSIS — R918 Other nonspecific abnormal finding of lung field: Secondary | ICD-10-CM | POA: Diagnosis not present

## 2024-04-01 DIAGNOSIS — I2510 Atherosclerotic heart disease of native coronary artery without angina pectoris: Secondary | ICD-10-CM | POA: Diagnosis not present

## 2024-04-01 DIAGNOSIS — R931 Abnormal findings on diagnostic imaging of heart and coronary circulation: Secondary | ICD-10-CM

## 2024-04-01 MED ORDER — IOHEXOL 350 MG/ML SOLN
100.0000 mL | Freq: Once | INTRAVENOUS | Status: AC | PRN
Start: 2024-04-01 — End: 2024-04-01
  Administered 2024-04-01: 100 mL via INTRAVENOUS

## 2024-04-01 MED ORDER — NITROGLYCERIN 0.4 MG SL SUBL
0.8000 mg | SUBLINGUAL_TABLET | Freq: Once | SUBLINGUAL | Status: AC
  Administered 2024-04-01: 0.8 mg via SUBLINGUAL

## 2024-04-02 ENCOUNTER — Ambulatory Visit: Payer: Self-pay | Admitting: Student

## 2024-04-05 ENCOUNTER — Ambulatory Visit (INDEPENDENT_AMBULATORY_CARE_PROVIDER_SITE_OTHER): Payer: Medicare (Managed Care)

## 2024-04-05 ENCOUNTER — Telehealth: Payer: Self-pay | Admitting: Student

## 2024-04-05 DIAGNOSIS — I4819 Other persistent atrial fibrillation: Secondary | ICD-10-CM | POA: Diagnosis not present

## 2024-04-05 LAB — CUP PACEART REMOTE DEVICE CHECK
Date Time Interrogation Session: 20250928231219
Implantable Pulse Generator Implant Date: 20220316

## 2024-04-05 NOTE — Telephone Encounter (Signed)
 Pt is calling because he thinks he is suppose to be scheduling a cath. But is not sure. Please advise.

## 2024-04-05 NOTE — Telephone Encounter (Signed)
 Spoke with wife, ok per [facility]  Friday this week (Thursday not good) or Tuesday/Wednesday next week will work  Have scheduled patient for Friday 10/3 arrive at 7 am for 9 am procedure time with Dr Wendel   Advised wife, reviewed instructions, and sent mychart message

## 2024-04-06 ENCOUNTER — Other Ambulatory Visit: Payer: Self-pay | Admitting: Student

## 2024-04-06 NOTE — Progress Notes (Signed)
 error

## 2024-04-06 NOTE — H&P (View-Only) (Signed)
 error

## 2024-04-07 NOTE — Progress Notes (Signed)
 Remote Loop Recorder Transmission

## 2024-04-08 ENCOUNTER — Telehealth: Payer: Self-pay | Admitting: *Deleted

## 2024-04-08 NOTE — Progress Notes (Signed)
 Remote Loop Recorder Transmission

## 2024-04-08 NOTE — Telephone Encounter (Signed)
 Cardiac Catheterization scheduled at [facility] for: Friday April 09, 2024 9 AM Arrival time [facility] Main Entrance A at: 7 AM  Diet: -Nothing to eat after midnight.  Hydration: -May drink clear liquids until 2 hours before the procedure.  Approved liquids: Water, clear tea, black coffee, fruit juices-non-citric and without pulp,Gatorade, plain Jello/popsicles.   -Please drink 16  oz of water 2 hours before procedure.  Medication instructions: -Hold:  Eliquis -pt tells me he took last dose of Eliquis  Sunday 9/28 and knows to hold until post procedure -Other usual morning medications can be taken including aspirin 81 mg.  Plan to go home the same day, you will only stay overnight if medically necessary.  You must have responsible adult to drive you home.  Someone must be with you the first 24 hours after you arrive home.  Reviewed procedure instructions with patient.

## 2024-04-09 ENCOUNTER — Encounter (HOSPITAL_COMMUNITY)
Admission: RE | Disposition: A | Discharge status: Home / Self Care | Payer: Self-pay | Source: Home / Self Care | Attending: Internal Medicine

## 2024-04-09 ENCOUNTER — Encounter (HOSPITAL_COMMUNITY): Payer: Self-pay | Admitting: Internal Medicine

## 2024-04-09 ENCOUNTER — Ambulatory Visit: Payer: Self-pay | Admitting: Cardiology

## 2024-04-09 ENCOUNTER — Other Ambulatory Visit: Payer: Self-pay

## 2024-04-09 ENCOUNTER — Inpatient Hospital Stay (HOSPITAL_COMMUNITY)
Admission: RE | Admit: 2024-04-09 | Discharge: 2024-04-13 | DRG: 323 | Disposition: A | Discharge status: Home / Self Care | Payer: Medicare (Managed Care) | Attending: Internal Medicine | Admitting: Internal Medicine

## 2024-04-09 DIAGNOSIS — I160 Hypertensive urgency: Secondary | ICD-10-CM | POA: Diagnosis not present

## 2024-04-09 DIAGNOSIS — I110 Hypertensive heart disease with heart failure: Secondary | ICD-10-CM | POA: Diagnosis not present

## 2024-04-09 DIAGNOSIS — F439 Reaction to severe stress, unspecified: Secondary | ICD-10-CM | POA: Diagnosis not present

## 2024-04-09 DIAGNOSIS — I209 Angina pectoris, unspecified: Secondary | ICD-10-CM | POA: Diagnosis not present

## 2024-04-09 DIAGNOSIS — Z7902 Long term (current) use of antithrombotics/antiplatelets: Secondary | ICD-10-CM

## 2024-04-09 DIAGNOSIS — D72829 Elevated white blood cell count, unspecified: Secondary | ICD-10-CM | POA: Diagnosis not present

## 2024-04-09 DIAGNOSIS — Z7901 Long term (current) use of anticoagulants: Secondary | ICD-10-CM | POA: Diagnosis not present

## 2024-04-09 DIAGNOSIS — I472 Ventricular tachycardia, unspecified: Secondary | ICD-10-CM | POA: Diagnosis not present

## 2024-04-09 DIAGNOSIS — E872 Acidosis, unspecified: Secondary | ICD-10-CM | POA: Diagnosis not present

## 2024-04-09 DIAGNOSIS — E876 Hypokalemia: Secondary | ICD-10-CM | POA: Diagnosis present

## 2024-04-09 DIAGNOSIS — I4819 Other persistent atrial fibrillation: Secondary | ICD-10-CM | POA: Diagnosis present

## 2024-04-09 DIAGNOSIS — I7779 Dissection of other artery: Secondary | ICD-10-CM | POA: Diagnosis not present

## 2024-04-09 DIAGNOSIS — Z79899 Other long term (current) drug therapy: Secondary | ICD-10-CM | POA: Diagnosis not present

## 2024-04-09 DIAGNOSIS — T433X5A Adverse effect of phenothiazine antipsychotics and neuroleptics, initial encounter: Secondary | ICD-10-CM | POA: Diagnosis not present

## 2024-04-09 DIAGNOSIS — Z9861 Coronary angioplasty status: Secondary | ICD-10-CM

## 2024-04-09 DIAGNOSIS — K2970 Gastritis, unspecified, without bleeding: Secondary | ICD-10-CM | POA: Diagnosis not present

## 2024-04-09 DIAGNOSIS — F17290 Nicotine dependence, other tobacco product, uncomplicated: Secondary | ICD-10-CM | POA: Diagnosis not present

## 2024-04-09 DIAGNOSIS — K760 Fatty (change of) liver, not elsewhere classified: Secondary | ICD-10-CM | POA: Diagnosis present

## 2024-04-09 DIAGNOSIS — D6869 Other thrombophilia: Secondary | ICD-10-CM | POA: Diagnosis not present

## 2024-04-09 DIAGNOSIS — I10 Essential (primary) hypertension: Secondary | ICD-10-CM | POA: Diagnosis present

## 2024-04-09 DIAGNOSIS — R918 Other nonspecific abnormal finding of lung field: Secondary | ICD-10-CM | POA: Diagnosis present

## 2024-04-09 DIAGNOSIS — I25118 Atherosclerotic heart disease of native coronary artery with other forms of angina pectoris: Principal | ICD-10-CM | POA: Diagnosis present

## 2024-04-09 DIAGNOSIS — H8110 Benign paroxysmal vertigo, unspecified ear: Secondary | ICD-10-CM | POA: Diagnosis present

## 2024-04-09 DIAGNOSIS — K209 Esophagitis, unspecified without bleeding: Secondary | ICD-10-CM | POA: Diagnosis not present

## 2024-04-09 DIAGNOSIS — R112 Nausea with vomiting, unspecified: Secondary | ICD-10-CM | POA: Diagnosis not present

## 2024-04-09 DIAGNOSIS — Z955 Presence of coronary angioplasty implant and graft: Principal | ICD-10-CM

## 2024-04-09 DIAGNOSIS — F39 Unspecified mood [affective] disorder: Secondary | ICD-10-CM | POA: Diagnosis present

## 2024-04-09 DIAGNOSIS — K2100 Gastro-esophageal reflux disease with esophagitis, without bleeding: Secondary | ICD-10-CM | POA: Diagnosis present

## 2024-04-09 DIAGNOSIS — Z8249 Family history of ischemic heart disease and other diseases of the circulatory system: Secondary | ICD-10-CM | POA: Diagnosis not present

## 2024-04-09 DIAGNOSIS — E782 Mixed hyperlipidemia: Secondary | ICD-10-CM | POA: Diagnosis present

## 2024-04-09 DIAGNOSIS — R001 Bradycardia, unspecified: Secondary | ICD-10-CM | POA: Diagnosis not present

## 2024-04-09 DIAGNOSIS — I5032 Chronic diastolic (congestive) heart failure: Secondary | ICD-10-CM | POA: Diagnosis not present

## 2024-04-09 DIAGNOSIS — H8112 Benign paroxysmal vertigo, left ear: Secondary | ICD-10-CM

## 2024-04-09 DIAGNOSIS — R066 Hiccough: Secondary | ICD-10-CM | POA: Diagnosis present

## 2024-04-09 DIAGNOSIS — I25119 Atherosclerotic heart disease of native coronary artery with unspecified angina pectoris: Secondary | ICD-10-CM | POA: Diagnosis not present

## 2024-04-09 DIAGNOSIS — I480 Paroxysmal atrial fibrillation: Secondary | ICD-10-CM | POA: Diagnosis not present

## 2024-04-09 DIAGNOSIS — I428 Other cardiomyopathies: Secondary | ICD-10-CM | POA: Diagnosis not present

## 2024-04-09 HISTORY — PX: CORONARY LITHOTRIPSY: CATH118330

## 2024-04-09 HISTORY — PX: LEFT HEART CATH AND CORONARY ANGIOGRAPHY: CATH118249

## 2024-04-09 HISTORY — PX: CORONARY IMAGING/OCT: CATH118326

## 2024-04-09 HISTORY — PX: CORONARY STENT INTERVENTION: CATH118234

## 2024-04-09 LAB — POCT ACTIVATED CLOTTING TIME
Activated Clotting Time: 331 s
Activated Clotting Time: 273 s

## 2024-04-09 MED ORDER — HEPARIN (PORCINE) IN NACL 1000-0.9 UT/500ML-% IV SOLN
INTRAVENOUS | Status: DC | PRN
  Administered 2024-04-09: 1000 mL

## 2024-04-09 MED ORDER — NITROGLYCERIN 1 MG/10 ML FOR IR/CATH LAB
INTRA_ARTERIAL | Status: AC
  Filled 2024-04-09: qty 10

## 2024-04-09 MED ORDER — SODIUM CHLORIDE 0.9% FLUSH
3.0000 mL | Freq: Two times a day (BID) | INTRAVENOUS | Status: DC
  Administered 2024-04-09 – 2024-04-13 (×8): 3 mL via INTRAVENOUS

## 2024-04-09 MED ORDER — CLOPIDOGREL BISULFATE 300 MG PO TABS
ORAL_TABLET | ORAL | Status: AC
  Filled 2024-04-09: qty 1

## 2024-04-09 MED ORDER — ASPIRIN 81 MG PO CHEW: 81.0000 mg | CHEWABLE_TABLET | ORAL | Status: DC

## 2024-04-09 MED ORDER — FREE WATER: 500.0000 mL | Freq: Once | Status: DC

## 2024-04-09 MED ORDER — LABETALOL HCL 5 MG/ML IV SOLN: 10.0000 mg | INTRAVENOUS | Status: AC | PRN

## 2024-04-09 MED ORDER — SODIUM CHLORIDE 0.9 % IV SOLN: 250.0000 mL | INTRAVENOUS | Status: AC | PRN

## 2024-04-09 MED ORDER — HYDRALAZINE HCL 20 MG/ML IJ SOLN
INTRAMUSCULAR | Status: DC | PRN
  Administered 2024-04-09: 20 mg via INTRAVENOUS
  Administered 2024-04-09 (×2): 10 mg via INTRAVENOUS

## 2024-04-09 MED ORDER — HEPARIN SODIUM (PORCINE) 1000 UNIT/ML IJ SOLN
INTRAMUSCULAR | Status: DC | PRN
  Administered 2024-04-09: 1000 [IU] via INTRAVENOUS
  Administered 2024-04-09 (×2): 5000 [IU] via INTRAVENOUS

## 2024-04-09 MED ORDER — VERAPAMIL HCL 2.5 MG/ML IV SOLN
INTRAVENOUS | Status: DC | PRN
  Administered 2024-04-09: 10 mL via INTRA_ARTERIAL

## 2024-04-09 MED ORDER — ONDANSETRON HCL 4 MG/2ML IJ SOLN
4.0000 mg | Freq: Four times a day (QID) | INTRAMUSCULAR | Status: DC | PRN
  Administered 2024-04-09 – 2024-04-10 (×3): 4 mg via INTRAVENOUS
  Filled 2024-04-09 (×3): qty 2

## 2024-04-09 MED ORDER — SODIUM CHLORIDE 0.9 % IV SOLN
12.5000 mg | INTRAVENOUS | Status: AC
  Administered 2024-04-09: 12.5 mg via INTRAVENOUS
  Filled 2024-04-09: qty 0.5

## 2024-04-09 MED ORDER — CANGRELOR BOLUS VIA INFUSION
INTRAVENOUS | Status: DC | PRN
  Administered 2024-04-09: 2613 ug via INTRAVENOUS

## 2024-04-09 MED ORDER — IOHEXOL 350 MG/ML SOLN
INTRAVENOUS | Status: DC | PRN
  Administered 2024-04-09: 180 mL

## 2024-04-09 MED ORDER — CLOPIDOGREL BISULFATE 300 MG PO TABS
ORAL_TABLET | ORAL | Status: DC | PRN
  Administered 2024-04-09: 300 mg via ORAL

## 2024-04-09 MED ORDER — FENTANYL CITRATE (PF) 100 MCG/2ML IJ SOLN
INTRAMUSCULAR | Status: DC | PRN
  Administered 2024-04-09 (×2): 25 ug via INTRAVENOUS

## 2024-04-09 MED ORDER — SODIUM CHLORIDE 0.9 % IV SOLN
4.0000 ug/kg/min | INTRAVENOUS | Status: DC
  Administered 2024-04-09 – 2024-04-10 (×6): 4 ug/kg/min via INTRAVENOUS
  Filled 2024-04-09 (×7): qty 50

## 2024-04-09 MED ORDER — VERAPAMIL HCL 2.5 MG/ML IV SOLN
INTRAVENOUS | Status: AC
Start: 2024-04-09 — End: 2024-04-09
  Filled 2024-04-09: qty 2

## 2024-04-09 MED ORDER — LIDOCAINE HCL (PF) 1 % IJ SOLN
INTRAMUSCULAR | Status: DC | PRN
  Administered 2024-04-09: 2 mL

## 2024-04-09 MED ORDER — ONDANSETRON HCL 4 MG/2ML IJ SOLN
INTRAMUSCULAR | Status: DC | PRN
  Administered 2024-04-09: 4 mg via INTRAVENOUS

## 2024-04-09 MED ORDER — SODIUM CHLORIDE 0.9% FLUSH: 3.0000 mL | INTRAVENOUS | Status: DC | PRN

## 2024-04-09 MED ORDER — HEPARIN SODIUM (PORCINE) 1000 UNIT/ML IJ SOLN
INTRAMUSCULAR | Status: AC
  Filled 2024-04-09: qty 10

## 2024-04-09 MED ORDER — FENTANYL CITRATE (PF) 100 MCG/2ML IJ SOLN
INTRAMUSCULAR | Status: AC
  Filled 2024-04-09: qty 2

## 2024-04-09 MED ORDER — SODIUM CHLORIDE 0.9% FLUSH: 3.0000 mL | Freq: Two times a day (BID) | INTRAVENOUS | Status: DC

## 2024-04-09 MED ORDER — LABETALOL HCL 5 MG/ML IV SOLN
INTRAVENOUS | Status: AC
  Filled 2024-04-09: qty 4

## 2024-04-09 MED ORDER — ONDANSETRON HCL 4 MG/2ML IJ SOLN
INTRAMUSCULAR | Status: AC
  Filled 2024-04-09: qty 2

## 2024-04-09 MED ORDER — NITROGLYCERIN 1 MG/10 ML FOR IR/CATH LAB
INTRA_ARTERIAL | Status: DC | PRN
  Administered 2024-04-09: 150 ug via INTRACORONARY

## 2024-04-09 MED ORDER — HEPARIN SODIUM (PORCINE) 1000 UNIT/ML IJ SOLN
INTRAMUSCULAR | Status: AC
Start: 2024-04-09 — End: 2024-04-09
  Filled 2024-04-09: qty 10

## 2024-04-09 MED ORDER — MORPHINE SULFATE (PF) 2 MG/ML IV SOLN: 2.0000 mg | INTRAVENOUS | Status: DC | PRN

## 2024-04-09 MED ORDER — SODIUM CHLORIDE 0.9 % IV SOLN
INTRAVENOUS | Status: DC | PRN
  Administered 2024-04-09: 4 ug/kg/min via INTRAVENOUS

## 2024-04-09 MED ORDER — CANGRELOR TETRASODIUM 50 MG IV SOLR
INTRAVENOUS | Status: AC
  Filled 2024-04-09: qty 50

## 2024-04-09 MED ORDER — ACETAMINOPHEN 325 MG PO TABS: 650.0000 mg | ORAL_TABLET | ORAL | Status: DC | PRN

## 2024-04-09 MED ORDER — LIDOCAINE HCL (PF) 1 % IJ SOLN
INTRAMUSCULAR | Status: AC
  Filled 2024-04-09: qty 30

## 2024-04-09 MED ORDER — HYDRALAZINE HCL 20 MG/ML IJ SOLN
10.0000 mg | INTRAMUSCULAR | Status: AC | PRN
  Filled 2024-04-09: qty 1

## 2024-04-09 MED ORDER — MIDAZOLAM HCL 2 MG/2ML IJ SOLN
INTRAMUSCULAR | Status: AC
  Filled 2024-04-09: qty 2

## 2024-04-09 MED ORDER — SODIUM CHLORIDE 0.9 % IV SOLN: 250.0000 mL | INTRAVENOUS | Status: DC | PRN

## 2024-04-09 MED ORDER — MIDAZOLAM HCL 2 MG/2ML IJ SOLN
INTRAMUSCULAR | Status: DC | PRN
  Administered 2024-04-09 (×2): 1 mg via INTRAVENOUS

## 2024-04-09 MED ORDER — ASPIRIN 81 MG PO CHEW
81.0000 mg | CHEWABLE_TABLET | Freq: Every day | ORAL | Status: DC
  Administered 2024-04-11 – 2024-04-13 (×3): 81 mg via ORAL
  Filled 2024-04-09 (×3): qty 1

## 2024-04-09 MED ORDER — HYDRALAZINE HCL 20 MG/ML IJ SOLN
INTRAMUSCULAR | Status: AC
  Filled 2024-04-09: qty 2

## 2024-04-09 MED ORDER — FREE WATER: 250.0000 mL | Freq: Once | Status: DC

## 2024-04-09 NOTE — Progress Notes (Signed)
 Pt converted to NSR from Afib. EKG done to confirm. Dr. Wendel updated.

## 2024-04-09 NOTE — Interval H&P Note (Signed)
 History and Physical Interval Note:  04/09/2024 9:20 AM  Dylan Olsen  has presented today for surgery, with the diagnosis of abnormal ct - chest pain.  The various methods of treatment have been discussed with the patient and family. After consideration of risks, benefits and other options for treatment, the patient has consented to  Procedure(s): LEFT HEART CATH AND CORONARY ANGIOGRAPHY (N/A) as a surgical intervention.  The patient's history has been reviewed, patient examined, no change in status, stable for surgery.  I have reviewed the patient's chart and labs.  Questions were answered to the patient's satisfaction.     Charity Tessier K Rainey Kahrs

## 2024-04-09 NOTE — Progress Notes (Signed)
 Dr. Wendel called and made aware pt continuing to experience frequent N/V along with 6/10 CP. MD expecting CP, and already reviewed post intervention EKG. Verbal orders given for phenergan. This RN called Pharmacist for guidance on placing verbal orders.

## 2024-04-10 ENCOUNTER — Observation Stay (HOSPITAL_COMMUNITY): Payer: Medicare (Managed Care)

## 2024-04-10 DIAGNOSIS — Z7901 Long term (current) use of anticoagulants: Secondary | ICD-10-CM | POA: Diagnosis not present

## 2024-04-10 DIAGNOSIS — K209 Esophagitis, unspecified without bleeding: Secondary | ICD-10-CM | POA: Diagnosis not present

## 2024-04-10 DIAGNOSIS — E782 Mixed hyperlipidemia: Secondary | ICD-10-CM | POA: Diagnosis present

## 2024-04-10 DIAGNOSIS — D72829 Elevated white blood cell count, unspecified: Secondary | ICD-10-CM | POA: Diagnosis present

## 2024-04-10 DIAGNOSIS — D6869 Other thrombophilia: Secondary | ICD-10-CM | POA: Diagnosis not present

## 2024-04-10 DIAGNOSIS — I110 Hypertensive heart disease with heart failure: Secondary | ICD-10-CM | POA: Diagnosis present

## 2024-04-10 DIAGNOSIS — K2970 Gastritis, unspecified, without bleeding: Secondary | ICD-10-CM | POA: Diagnosis present

## 2024-04-10 DIAGNOSIS — Z7902 Long term (current) use of antithrombotics/antiplatelets: Secondary | ICD-10-CM | POA: Diagnosis not present

## 2024-04-10 DIAGNOSIS — F39 Unspecified mood [affective] disorder: Secondary | ICD-10-CM | POA: Diagnosis present

## 2024-04-10 DIAGNOSIS — I10 Essential (primary) hypertension: Secondary | ICD-10-CM | POA: Diagnosis not present

## 2024-04-10 DIAGNOSIS — I25118 Atherosclerotic heart disease of native coronary artery with other forms of angina pectoris: Secondary | ICD-10-CM | POA: Diagnosis present

## 2024-04-10 DIAGNOSIS — I428 Other cardiomyopathies: Secondary | ICD-10-CM | POA: Diagnosis not present

## 2024-04-10 DIAGNOSIS — I480 Paroxysmal atrial fibrillation: Secondary | ICD-10-CM | POA: Diagnosis not present

## 2024-04-10 DIAGNOSIS — I160 Hypertensive urgency: Secondary | ICD-10-CM | POA: Diagnosis not present

## 2024-04-10 DIAGNOSIS — Z8249 Family history of ischemic heart disease and other diseases of the circulatory system: Secondary | ICD-10-CM | POA: Diagnosis not present

## 2024-04-10 DIAGNOSIS — K2100 Gastro-esophageal reflux disease with esophagitis, without bleeding: Secondary | ICD-10-CM | POA: Diagnosis present

## 2024-04-10 DIAGNOSIS — R112 Nausea with vomiting, unspecified: Secondary | ICD-10-CM | POA: Diagnosis not present

## 2024-04-10 DIAGNOSIS — I5032 Chronic diastolic (congestive) heart failure: Secondary | ICD-10-CM | POA: Diagnosis present

## 2024-04-10 DIAGNOSIS — I4819 Other persistent atrial fibrillation: Secondary | ICD-10-CM | POA: Diagnosis present

## 2024-04-10 DIAGNOSIS — T433X5A Adverse effect of phenothiazine antipsychotics and neuroleptics, initial encounter: Secondary | ICD-10-CM | POA: Diagnosis not present

## 2024-04-10 DIAGNOSIS — Z79899 Other long term (current) drug therapy: Secondary | ICD-10-CM | POA: Diagnosis not present

## 2024-04-10 DIAGNOSIS — F17290 Nicotine dependence, other tobacco product, uncomplicated: Secondary | ICD-10-CM | POA: Diagnosis present

## 2024-04-10 DIAGNOSIS — K760 Fatty (change of) liver, not elsewhere classified: Secondary | ICD-10-CM | POA: Diagnosis present

## 2024-04-10 DIAGNOSIS — H8110 Benign paroxysmal vertigo, unspecified ear: Secondary | ICD-10-CM | POA: Diagnosis not present

## 2024-04-10 DIAGNOSIS — E872 Acidosis, unspecified: Secondary | ICD-10-CM | POA: Diagnosis not present

## 2024-04-10 DIAGNOSIS — I472 Ventricular tachycardia, unspecified: Secondary | ICD-10-CM | POA: Diagnosis not present

## 2024-04-10 DIAGNOSIS — E876 Hypokalemia: Secondary | ICD-10-CM | POA: Diagnosis present

## 2024-04-10 DIAGNOSIS — I7779 Dissection of other artery: Secondary | ICD-10-CM | POA: Diagnosis present

## 2024-04-10 DIAGNOSIS — I209 Angina pectoris, unspecified: Secondary | ICD-10-CM | POA: Diagnosis present

## 2024-04-10 DIAGNOSIS — F439 Reaction to severe stress, unspecified: Secondary | ICD-10-CM | POA: Diagnosis present

## 2024-04-10 LAB — CBC
HCT: 34.6 % — ABNORMAL LOW (ref 39.0–52.0)
Hemoglobin: 12.5 g/dL — ABNORMAL LOW (ref 13.0–17.0)
MCH: 31.3 pg (ref 26.0–34.0)
MCHC: 36.1 g/dL — ABNORMAL HIGH (ref 30.0–36.0)
MCV: 86.7 fL (ref 80.0–100.0)
Platelets: 187 K/uL (ref 150–400)
RBC: 3.99 MIL/uL — ABNORMAL LOW (ref 4.22–5.81)
RDW: 12.3 % (ref 11.5–15.5)
WBC: 11.5 K/uL — ABNORMAL HIGH (ref 4.0–10.5)
nRBC: 0 % (ref 0.0–0.2)

## 2024-04-10 LAB — BASIC METABOLIC PANEL WITH GFR
Anion gap: 9 (ref 5–15)
BUN: 8 mg/dL (ref 8–23)
CO2: 25 mmol/L (ref 22–32)
Calcium: 8.5 mg/dL — ABNORMAL LOW (ref 8.9–10.3)
Chloride: 107 mmol/L (ref 98–111)
Creatinine, Ser: 0.81 mg/dL (ref 0.61–1.24)
GFR, Estimated: >60 mL/min (ref >60)
Glucose, Bld: 133 mg/dL — ABNORMAL HIGH (ref 70–99)
Potassium: 3.3 mmol/L — ABNORMAL LOW (ref 3.5–5.1)
Sodium: 141 mmol/L (ref 135–145)

## 2024-04-10 LAB — ECHOCARDIOGRAM COMPLETE BUBBLE STUDY
AR max vel: 1.86 cm2
AV Area VTI: 1.96 cm2
AV Area mean vel: 1.85 cm2
AV Mean grad: 9 mmHg
AV Peak grad: 18.8 mmHg
Ao pk vel: 2.17 m/s
Area-P 1/2: 3.48 cm2
Calc EF: 71.1 %
Est EF: >75
S' Lateral: 2.5 cm
Single Plane A2C EF: 74.3 %
Single Plane A4C EF: 67.8 %

## 2024-04-10 LAB — GLUCOSE, CAPILLARY: Glucose-Capillary: 123 mg/dL — ABNORMAL HIGH (ref 70–99)

## 2024-04-10 LAB — MRSA NEXT GEN BY PCR, NASAL: MRSA by PCR Next Gen: NOT DETECTED

## 2024-04-10 MED ORDER — HYDRALAZINE HCL 20 MG/ML IJ SOLN
5.0000 mg | Freq: Once | INTRAMUSCULAR | Status: AC
  Administered 2024-04-10: 5 mg via INTRAVENOUS
  Filled 2024-04-10: qty 1

## 2024-04-10 MED ORDER — CLOPIDOGREL BISULFATE 75 MG PO TABS: 75.0000 mg | ORAL_TABLET | Freq: Every day | ORAL | Status: DC

## 2024-04-10 MED ORDER — ORAL CARE MOUTH RINSE: 15.0000 mL | OROMUCOSAL | Status: DC | PRN

## 2024-04-10 MED ORDER — SODIUM CHLORIDE 0.9 % IV SOLN
12.5000 mg | Freq: Four times a day (QID) | INTRAVENOUS | Status: DC | PRN
  Administered 2024-04-10 – 2024-04-11 (×3): 12.5 mg via INTRAVENOUS
  Filled 2024-04-10 (×4): qty 0.5

## 2024-04-10 MED ORDER — PERFLUTREN LIPID MICROSPHERE
1.0000 mL | INTRAVENOUS | Status: AC | PRN
  Administered 2024-04-10: 2.5 mL via INTRAVENOUS

## 2024-04-10 MED ORDER — CLOPIDOGREL BISULFATE 75 MG PO TABS: 300.0000 mg | ORAL_TABLET | Freq: Once | ORAL | Status: DC

## 2024-04-10 MED ORDER — POTASSIUM CHLORIDE 10 MEQ/100ML IV SOLN
10.0000 meq | INTRAVENOUS | Status: AC
  Administered 2024-04-10 (×4): 10 meq via INTRAVENOUS
  Filled 2024-04-10 (×4): qty 100

## 2024-04-10 MED ORDER — CANGRELOR TETRASODIUM 50 MG IV SOLR
0.7500 ug/kg/min | INTRAVENOUS | Status: AC
  Administered 2024-04-10 – 2024-04-11 (×5): 0.75 ug/kg/min via INTRAVENOUS
  Filled 2024-04-10 (×3): qty 50

## 2024-04-10 NOTE — Progress Notes (Signed)
 CARDIAC REHAB PHASE I   Pt asleep when I first arrived. Saw another pt and came back. Ed given to pt and spouse. Discussed restrictions, MI booklet, NTG use, blood thinner (pt does not tolerate Plavix well), stent, site care, heart healthy diet, and exercise guidelines. Pt left in bed w/ call bell in reach. Will refer to [facility] [place].   Will continue to follow.  [id number] Johnnie JINNY Moats, MS, ACSM-CEP 04/10/2024 11:42 AM

## 2024-04-10 NOTE — Care Management Obs Status (Signed)
 MEDICARE OBSERVATION STATUS NOTIFICATION   Patient Details  Name: Dylan Olsen MRN: [id number] Date of Birth: September 06, 1955   Medicare Observation Status Notification Given:       Roxie KANDICE Stain, RN 04/10/2024, 2:37 PM

## 2024-04-10 NOTE — TOC Progression Note (Signed)
 Transition of Care [facility]) - Progression Note    Patient Details  Name: Dylan Olsen MRN: [id number] Date of Birth: Jul 12, 1955  Transition of Care [facility]) CM/SW Contact  Robynn Eileen Hoose, RN Phone Number: 04/10/2024, 8:30 AM  Clinical Narrative:  Phone call attempt made to discuss MOON letter, no response.                       Expected Discharge Plan and Services                                               Social Drivers of Health (SDOH) Interventions SDOH Screenings   Food Insecurity: No Food Insecurity (04/09/2024)  Housing: Low Risk  (04/09/2024)  Transportation Needs: No Transportation Needs (04/09/2024)  Utilities: Not At Risk (04/09/2024)  Depression (PHQ2-9): Medium Risk (03/24/2024)  Social Connections: Socially Integrated (04/09/2024)  Tobacco Use: High Risk (04/09/2024)    Readmission Risk Interventions     No data to display

## 2024-04-10 NOTE — Progress Notes (Signed)
 Echocardiogram 2D Echocardiogram has been performed.  Kirra Verga N Breniya Goertzen,RDCS 04/10/2024, 8:56 AM

## 2024-04-10 NOTE — Progress Notes (Addendum)
 Pt's BP 190/96 MAP 124, HR sustaining @ 56 BPM, unable to keep down PO meds due to emesis. Attempted to notify Hillary, MD. See new orders.  Lonell LITTIE Lyme, RN

## 2024-04-10 NOTE — Progress Notes (Signed)
   Rounding Note    [name] Date of Encounter: 04/10/2024  [facility] Cardiologist: None   Subjective   Nauseated and throwing up this morning.  No chest pain.  Vital Signs    Vitals:   04/09/24 1900 04/09/24 1915 04/09/24 2301 04/10/24 0300  BP: 126/66 136/70 (!) 150/75 (!) 140/80  Pulse: 64 65 (!) 55 60  Resp: 13 16 20 19   Temp:  98.3 F (36.8 C) 98.5 F (36.9 C) 98.8 F (37.1 C)  TempSrc:  Oral Oral Oral  SpO2: 98% 98% 94% 97%  Weight:  87.8 kg    Height:  5' 11 (1.803 m)      Intake/Output Summary (Last 24 hours) at 04/10/2024 0758 Last data filed at 04/10/2024 0300 Gross per 24 hour  Intake 734.5 ml  Output 1070 ml  Net -335.5 ml      04/09/2024    7:15 PM 04/09/2024    7:58 AM 03/26/2024    8:49 AM  Last 3 Weights  Weight (lbs) 193 lb 9 oz 192 lb 192 lb  Weight (kg) 87.8 kg 87.091 kg 87.091 kg      Telemetry    Sinus- Personally Reviewed   Physical Exam   GEN: Uncomfortable..  Throwing up. Cardiac: RRR, no murmurs, rubs, or gallops.  Right upper extremity access site without hematoma or significant pain. Respiratory: Clear to auscultation bilaterally. Psych: Normal affect   Assessment & Plan    #Coronary artery disease Received LAD stent yesterday.  Overnight very nauseous.  Hemodynamically stable. Unclear if his nausea is related to some of the sedation.  I will get a surface echo this morning stat to confirm no mechanical complications. Unfortunately, he cannot reliably keep his Plavix down so we will continue his cangrelor infusion through today.  I have asked the nurse to give IV Zofran  this morning.  #Hypokalemia Replete through IV this morning.  This was discussed with bedside nurse  #Atrial fibrillation Hold anticoagulation for now given cangrelor infusion.  Anticipate restarting prior to discharge.  Keep in 2C.    Ole T. Cindie, MD, [facility], [facility] Cardiac Electrophysiology

## 2024-04-11 ENCOUNTER — Encounter (HOSPITAL_COMMUNITY): Payer: Self-pay | Admitting: Internal Medicine

## 2024-04-11 ENCOUNTER — Inpatient Hospital Stay (HOSPITAL_COMMUNITY): Payer: Medicare (Managed Care)

## 2024-04-11 DIAGNOSIS — I209 Angina pectoris, unspecified: Secondary | ICD-10-CM | POA: Diagnosis not present

## 2024-04-11 LAB — HEPATIC FUNCTION PANEL
ALT: 28 U/L (ref 0–44)
AST: 25 U/L (ref 15–41)
Albumin: 3.9 g/dL (ref 3.5–5.0)
Alkaline Phosphatase: 69 U/L (ref 38–126)
Bilirubin, Direct: 0.2 mg/dL (ref 0.0–0.2)
Indirect Bilirubin: 0.9 mg/dL (ref 0.3–0.9)
Total Bilirubin: 1.1 mg/dL (ref 0.0–1.2)
Total Protein: 6.2 g/dL — ABNORMAL LOW (ref 6.5–8.1)

## 2024-04-11 LAB — BASIC METABOLIC PANEL WITH GFR
Anion gap: 13 (ref 5–15)
BUN: 11 mg/dL (ref 8–23)
CO2: 24 mmol/L (ref 22–32)
Calcium: 8.7 mg/dL — ABNORMAL LOW (ref 8.9–10.3)
Chloride: 105 mmol/L (ref 98–111)
Creatinine, Ser: 0.87 mg/dL (ref 0.61–1.24)
GFR, Estimated: >60 mL/min (ref >60)
Glucose, Bld: 167 mg/dL — ABNORMAL HIGH (ref 70–99)
Potassium: 3.4 mmol/L — ABNORMAL LOW (ref 3.5–5.1)
Sodium: 142 mmol/L (ref 135–145)

## 2024-04-11 LAB — RESPIRATORY PANEL BY PCR

## 2024-04-11 LAB — CBC
HCT: 36.8 % — ABNORMAL LOW (ref 39.0–52.0)
Hemoglobin: 12.9 g/dL — ABNORMAL LOW (ref 13.0–17.0)
MCH: 30.9 pg (ref 26.0–34.0)
MCHC: 35.1 g/dL (ref 30.0–36.0)
MCV: 88 fL (ref 80.0–100.0)
Platelets: 200 K/uL (ref 150–400)
RBC: 4.18 MIL/uL — ABNORMAL LOW (ref 4.22–5.81)
RDW: 12.4 % (ref 11.5–15.5)
WBC: 11.9 K/uL — ABNORMAL HIGH (ref 4.0–10.5)
nRBC: 0 % (ref 0.0–0.2)

## 2024-04-11 LAB — LACTIC ACID, PLASMA: Lactic Acid, Venous: 2.2 mmol/L (ref 0.5–1.9)

## 2024-04-11 LAB — MAGNESIUM: Magnesium: 2.2 mg/dL (ref 1.7–2.4)

## 2024-04-11 MED ORDER — ATORVASTATIN CALCIUM 10 MG PO TABS
20.0000 mg | ORAL_TABLET | Freq: Every day | ORAL | Status: DC
  Administered 2024-04-11 – 2024-04-12 (×2): 20 mg via ORAL
  Filled 2024-04-11 (×2): qty 2

## 2024-04-11 MED ORDER — PANTOPRAZOLE SODIUM 40 MG IV SOLR
40.0000 mg | Freq: Two times a day (BID) | INTRAVENOUS | Status: DC
  Administered 2024-04-11 – 2024-04-13 (×5): 40 mg via INTRAVENOUS
  Filled 2024-04-11 (×5): qty 10

## 2024-04-11 MED ORDER — PROCHLORPERAZINE EDISYLATE 10 MG/2ML IJ SOLN
10.0000 mg | Freq: Four times a day (QID) | INTRAMUSCULAR | Status: DC | PRN
  Administered 2024-04-12: 10 mg via INTRAVENOUS
  Filled 2024-04-11: qty 2

## 2024-04-11 MED ORDER — LISINOPRIL 20 MG PO TABS
40.0000 mg | ORAL_TABLET | Freq: Every day | ORAL | Status: DC
  Administered 2024-04-11 – 2024-04-13 (×3): 40 mg via ORAL
  Filled 2024-04-11 (×3): qty 2

## 2024-04-11 MED ORDER — HYDRALAZINE HCL 20 MG/ML IJ SOLN
10.0000 mg | Freq: Once | INTRAMUSCULAR | Status: AC
  Administered 2024-04-11: 10 mg via INTRAVENOUS
  Filled 2024-04-11: qty 1

## 2024-04-11 MED ORDER — CHLORPROMAZINE HCL 10 MG PO TABS
10.0000 mg | ORAL_TABLET | Freq: Three times a day (TID) | ORAL | Status: DC
  Administered 2024-04-11 – 2024-04-12 (×4): 10 mg via ORAL
  Filled 2024-04-11 (×4): qty 1

## 2024-04-11 MED ORDER — ONDANSETRON HCL 4 MG/2ML IJ SOLN
4.0000 mg | Freq: Four times a day (QID) | INTRAMUSCULAR | Status: DC | PRN
  Administered 2024-04-11 – 2024-04-12 (×2): 4 mg via INTRAVENOUS
  Filled 2024-04-11 (×2): qty 2

## 2024-04-11 MED ORDER — LORAZEPAM 0.5 MG PO TABS: 0.5000 mg | ORAL_TABLET | Freq: Three times a day (TID) | ORAL | Status: DC | PRN

## 2024-04-11 MED ORDER — SODIUM CHLORIDE (PF) 0.9 % IJ SOLN
INTRAMUSCULAR | Status: AC
  Administered 2024-04-11: 10 mL
  Filled 2024-04-11: qty 10

## 2024-04-11 MED ORDER — MECLIZINE HCL 12.5 MG PO TABS
12.5000 mg | ORAL_TABLET | Freq: Three times a day (TID) | ORAL | Status: DC | PRN
  Administered 2024-04-12: 12.5 mg via ORAL
  Filled 2024-04-11 (×2): qty 1

## 2024-04-11 MED ORDER — SUCRALFATE 1 GM/10ML PO SUSP
1.0000 g | Freq: Three times a day (TID) | ORAL | Status: DC
  Administered 2024-04-11 – 2024-04-12 (×4): 1 g via ORAL
  Filled 2024-04-11 (×4): qty 10

## 2024-04-11 MED ORDER — POTASSIUM CHLORIDE 10 MEQ/100ML IV SOLN
10.0000 meq | INTRAVENOUS | Status: AC
  Administered 2024-04-11 (×4): 10 meq via INTRAVENOUS
  Filled 2024-04-11 (×4): qty 100

## 2024-04-11 MED ORDER — IOHEXOL 350 MG/ML SOLN
75.0000 mL | Freq: Once | INTRAVENOUS | Status: AC | PRN
  Administered 2024-04-11: 75 mL via INTRAVENOUS

## 2024-04-11 MED ORDER — FLUOXETINE HCL 20 MG PO CAPS
20.0000 mg | ORAL_CAPSULE | Freq: Every day | ORAL | Status: DC
  Administered 2024-04-11 – 2024-04-13 (×3): 20 mg via ORAL
  Filled 2024-04-11 (×3): qty 1

## 2024-04-11 MED ORDER — SUCRALFATE 1 GM/10ML PO SUSP: 1.0000 g | Freq: Two times a day (BID) | ORAL | Status: DC

## 2024-04-11 NOTE — Plan of Care (Signed)
  Problem: Education: Goal: Understanding of CV disease, CV risk reduction, and recovery process will improve Outcome: Progressing Goal: Individualized Educational Video(s) Outcome: Progressing   Problem: Activity: Goal: Ability to return to baseline activity level will improve Outcome: Progressing   Problem: Cardiovascular: Goal: Ability to achieve and maintain adequate cardiovascular perfusion will improve Outcome: Progressing Goal: Vascular access site(s) Level 0-1 will be maintained Outcome: Progressing   Problem: Health Behavior/Discharge Planning: Goal: Ability to safely manage health-related needs after discharge will improve Outcome: Progressing   Problem: Education: Goal: Knowledge of General Education information will improve Description: Including pain rating scale, medication(s)/side effects and non-pharmacologic comfort measures Outcome: Progressing   Problem: Health Behavior/Discharge Planning: Goal: Ability to manage health-related needs will improve Outcome: Progressing   Problem: Clinical Measurements: Goal: Ability to maintain clinical measurements within normal limits will improve Outcome: Progressing Goal: Will remain free from infection Outcome: Progressing Goal: Diagnostic test results will improve Outcome: Progressing Goal: Respiratory complications will improve Outcome: Progressing Goal: Cardiovascular complication will be avoided Outcome: Progressing   Problem: Activity: Goal: Risk for activity intolerance will decrease Outcome: Progressing   Problem: Nutrition: Goal: Adequate nutrition will be maintained Outcome: Progressing   Problem: Coping: Goal: Level of anxiety will decrease Outcome: Progressing   Problem: Elimination: Goal: Will not experience complications related to bowel motility Outcome: Progressing Goal: Will not experience complications related to urinary retention Outcome: Progressing   Problem: Pain Managment: Goal:  General experience of comfort will improve and/or be controlled Outcome: Progressing   Problem: Safety: Goal: Ability to remain free from injury will improve Outcome: Progressing

## 2024-04-11 NOTE — Progress Notes (Signed)
   Rounding Note    [name] Date of Encounter: 04/11/2024  [facility] Cardiologist: None   Subjective   Still nauseated and vomiting this AM.  Vital Signs    Vitals:   04/10/24 1539 04/10/24 1925 04/10/24 2344 04/11/24 0536  BP: (!) 188/83 (!) 190/96 (!) 190/88 (!) 189/90  Pulse: (!) 53 60 75 70  Resp: 13 14 18 20   Temp: 98.8 F (37.1 C) 98.1 F (36.7 C) 98.7 F (37.1 C) 97.6 F (36.4 C)  TempSrc: Oral Oral Oral Oral  SpO2: 96% 97% 95% 97%  Weight:      Height:        Intake/Output Summary (Last 24 hours) at 04/11/2024 0755 Last data filed at 04/11/2024 9391 Gross per 24 hour  Intake 1351.46 ml  Output 750 ml  Net 601.46 ml      04/09/2024    7:15 PM 04/09/2024    7:58 AM 03/26/2024    8:49 AM  Last 3 Weights  Weight (lbs) 193 lb 9 oz 192 lb 192 lb  Weight (kg) 87.8 kg 87.091 kg 87.091 kg      Telemetry    Sinus- Personally Reviewed   Physical Exam   GEN: Uncomfortable..  Throwing up. Cardiac: RRR, no murmurs, rubs, or gallops.  Right upper extremity access site without hematoma or significant pain. Respiratory: Clear to auscultation bilaterally. Psych: Normal affect   Assessment & Plan    #Coronary artery disease Received LAD stent 10/3 Has been very nauseous since the procedure.  Hemodynamically stable. Echo shows no mechanical complications of MI, CAD. Unfortunately, he cannot reliably keep his Plavix down so we will continue his cangrelor infusion for now.  #Nausea/Vomiting Order CT abd/pelvis Order lipase and lactic acid Cont zofran   Ask medicine service to weigh in  #Hypokalemia Replete through IV this morning.   #Atrial fibrillation Hold anticoagulation for now given cangrelor infusion.  Anticipate restarting prior to discharge.  Keep in 2C.    Ole T. Cindie, MD, [facility], [facility] Cardiac Electrophysiology

## 2024-04-11 NOTE — Progress Notes (Signed)
 Cards, Dr. Hillary, paged about Pt's high BP (199/90). See new orders.

## 2024-04-11 NOTE — Hospital Course (Signed)
 The patient with PMH of HLD, GERD, pericarditis, paroxysmal A-fib SP PVI, loop recorder placement, chronic HFpEF presented to cardiology office for follow-up with complaints of chest pain. He underwent CT coronary with coronary calcium  scores of 1223 with evidence of CAD. Presented for cardiac catheterization.  During the catheterization patient had multiple episodes of nausea and 1 episode of vomiting. Cardiac catheterization was performed on 10/3. Patient has been on cangrelor IV infusion since then as he has not been able to tolerate oral medications and oral diet. Initially this was thought to be secondary to medication side effect but patient continues to have nausea and vomiting on 10/4 as well as 10/5. At the time of my evaluation patient reports that he has ongoing hiccups as well.  Hiccups have been reported to be ongoing since last 6 months and progressively worsening but now consistent since hospitalization. 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  He is aware of maneuvers but does not perform them for symptom control as vertigo always comes back. Wife reports he had 50 episodes of vomiting yesterday although patient reports that he had 10 episodes of vomiting yesterday with 4 of them were large volume. Reports 2 episodes of vomiting today at the time of my evaluation. No blood in the vomit reported. Denies any abdominal pain. Denies any food that he would not be eating otherwise. Reports he has history of GERD and takes Pepcid  twice a day. Wife also reports that whenever the patient is in stress he has some GI symptoms. Denies any alcohol abuse. Denies any active smoking. Coronary CT also showed solid pulmonary nodules, 6 mm in right lower lobe.  On noncontrast CT scan recommended in 3 to 6 months.  Patient denies having any weight loss. Denies any chronic cough.  He used to smoke cigar but never smoked cigarettes. He reports that he has  trouble breathing on exertion that requires him to take a break every now and then ongoing for last couple of years.  Assessment and Plan: Intractable nausea and vomiting. GERD/gastritis. Likely in the setting of gastritis. Will treat with PPI twice daily IV. Also add Carafate 4 times daily. Continue Zofran  as needed.  Switch to Compazine as needed for refractory nausea. CT scan shows evidence of esophagitis without any other acute abnormality. If symptom does not improve may require GI consultation.  CAD. HLD. Primary is cardiology, management per cardiology. Underwent cardiac catheterization with stenting. On aspirin and cangrelor infusion for now. Also on Lipitor.  Paroxysmal A-fib. On Eliquis  at home. Management per cardiology   Mood disorder. Could be under stress after undergoing cardiac workup and treatment. On Prozac  at home.  Will continue. Add Ativan as needed.  Hypertensive urgency. Blood pressure elevated. Resumption of lisinopril . With sinus bradycardia metoprolol  on hold. Management per cardiology.  BPPV. Will get PT evaluation. Meclizine  as needed.  Hypokalemia. Replacing orally.  Lactic acidosis. Mild. From poor p.o. intake. No evidence of sepsis. Encourage oral hydration.  Leukocytosis. Secondary to stress reaction. Monitor for now. No evidence of acute infection function  Lung nodule. 6 mm right lower lobe pulm nodule. 8 mm right lower lobe granuloma. Repeat CT chest in 3 to 6 months.

## 2024-04-11 NOTE — Consult Note (Signed)
 [facility] Initial Consultation Note  [name]    FMW:[id number] PCP: [name], MD     DOB: 05/24/1956  DOA: 04/09/2024 DOS: the patient was seen and examined on 04/11/2024  Primary Service: Wendel Lurena POUR, MD Referring physician: Dr. Cindie Reason for consult: Intractable nausea and vomiting  HPI The patient with PMH of HLD, GERD, pericarditis, paroxysmal A-fib SP PVI, loop recorder placement, chronic HFpEF presented to cardiology office for follow-up with complaints of chest pain. He underwent CT coronary with coronary calcium  scores of 1223 with evidence of CAD. Presented for cardiac catheterization.  During the catheterization patient had multiple episodes of nausea and 1 episode of vomiting. Cardiac catheterization was performed on 10/3. Patient has been on cangrelor IV infusion since then as he has not been able to tolerate oral medications and oral diet. Initially this was thought to be secondary to medication side effect but patient continues to have nausea and vomiting on 10/4 as well as 10/5. At the time of my evaluation patient reports that he has ongoing hiccups as well.  Hiccups have been reported to be ongoing since last 6 months and progressively worsening but now consistent since hospitalization. 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  He is aware of maneuvers but does not perform them for symptom control as vertigo always comes back. Wife reports he had 50 episodes of vomiting yesterday although patient reports that he had 10 episodes of vomiting yesterday with 4 of them were large volume. Reports 2 episodes of vomiting today at the time of my evaluation. No blood in the vomit reported. Denies any abdominal pain. Denies any food that he would not be eating otherwise. Reports he has history of GERD and takes Pepcid  twice a day. Wife also reports that whenever the patient is in stress he has some  GI symptoms. Denies any alcohol abuse. Denies any active smoking. Coronary CT also showed solid pulmonary nodules, 6 mm in right lower lobe.  On noncontrast CT scan recommended in 3 to 6 months.  Patient denies having any weight loss. Denies any chronic cough.  He used to smoke cigar but never smoked cigarettes. He reports that he has trouble breathing on exertion that requires him to take a break every now and then ongoing for last couple of years.  Assessment and Plan: Intractable nausea and vomiting. GERD/gastritis. Likely in the setting of gastritis. Will treat with PPI twice daily IV. Also add Carafate 4 times daily. Continue Zofran  as needed.  Switch to Compazine as needed for refractory nausea. CT scan shows evidence of esophagitis without any other acute abnormality. If symptom does not improve may require GI consultation.  CAD. HLD. Primary is cardiology, management per cardiology. Underwent cardiac catheterization with stenting. On aspirin and cangrelor infusion for now. Also on Lipitor.  Paroxysmal A-fib. On Eliquis  at home. Management per cardiology   Mood disorder. Could be under stress after undergoing cardiac workup and treatment. On Prozac  at home.  Will continue. Add Ativan as needed.  Hypertensive urgency. Blood pressure elevated. Resumption of lisinopril . With sinus bradycardia metoprolol  on hold. Management per cardiology.  BPPV. Will get PT evaluation. Meclizine  as needed.  Hypokalemia. Replacing orally.  Lactic acidosis. Mild. From poor p.o. intake. No evidence of sepsis. Encourage oral hydration.  Leukocytosis. Secondary to stress reaction. Monitor for now. No evidence of acute infection function  Lung nodule. 6 mm right lower lobe pulm nodule. 8 mm  right lower lobe granuloma. Repeat CT chest in 3 to 6 months.   We will continue to follow the patient.   Review of Systems: as mentioned in the history of present illness.  All other  systems reviewed and are negative.  Past Medical History:  Diagnosis Date   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 04/2020 following Afib ablation.   Persistent atrial fibrillation (HCC)    a. 04/2020 s/p PVI.   Primary hypertension 02/06/2021   Past Surgical History:  Procedure Laterality Date   ATRIAL FIBRILLATION ABLATION N/A 04/07/2020   Procedure: ATRIAL FIBRILLATION ABLATION;  Surgeon: Cindie Ole DASEN, MD;  Location: [facility];  Service: Cardiovascular;  Laterality: N/A;   BACK SURGERY     BUBBLE STUDY  04/06/2020   Procedure: BUBBLE STUDY;  Surgeon: Santo Stanly LABOR, MD;  Location: [facility] ENDOSCOPY;  Service: Cardiovascular;;   CORONARY IMAGING/OCT N/A 04/09/2024   Procedure: CORONARY IMAGING/OCT;  Surgeon: Wendel Lurena POUR, MD;  Location: [facility];  Service: Cardiovascular;  Laterality: N/A;   CORONARY LITHOTRIPSY N/A 04/09/2024   Procedure: CORONARY LITHOTRIPSY;  Surgeon: Wendel Lurena POUR, MD;  Location: [facility];  Service: Cardiovascular;  Laterality: N/A;   CORONARY STENT INTERVENTION N/A 04/09/2024   Procedure: CORONARY STENT INTERVENTION;  Surgeon: Wendel Lurena POUR, MD;  Location: [facility];  Service: Cardiovascular;  Laterality: N/A;   FOOT SURGERY     KNEE SURGERY Left    LEFT HEART CATH AND CORONARY ANGIOGRAPHY N/A 04/09/2024   Procedure: LEFT HEART CATH AND CORONARY ANGIOGRAPHY;  Surgeon: Wendel Lurena POUR, MD;  Location: [facility];  Service: Cardiovascular;  Laterality: N/A;   TEE WITHOUT CARDIOVERSION N/A 04/06/2020   Procedure: TRANSESOPHAGEAL ECHOCARDIOGRAM (TEE);  Surgeon: Santo Stanly LABOR, MD;  Location: [facility] ENDOSCOPY;  Service: Cardiovascular;  Laterality: N/A;   Social History:  reports that he has been smoking cigars. He has never used smokeless tobacco. He reports current alcohol use. He  reports current drug use. Drug: Marijuana. Allergies  Allergen Reactions   Other Other (See Comments)    Pt reports he coded after having anesthesia with a back surgery  Can not recall what medicine   Family History  Problem Relation Age of Onset   Heart disease Father    Heart attack Father 65   Cancer Cousin        unknown primary   Stomach cancer Neg Hx    Rectal cancer Neg Hx    Kidney disease Neg Hx    Colon cancer Neg Hx    Colon polyps Neg Hx    Prior to Admission medications   Medication Sig Start Date End Date Taking? Authorizing Provider  acetaminophen  (TYLENOL ) 500 MG tablet Take 500 mg by mouth every 6 (six) hours as needed (pain).   Yes [provider]  apixaban  (ELIQUIS ) 5 MG TABS tablet Take 1 tablet (5 mg total) by mouth 2 (two) times daily. 12/23/23  Yes Fenton, Clint R, PA  atorvastatin  (LIPITOR) 20 MG tablet TAKE 1 TABLET BY MOUTH EVERY DAY Patient taking differently: Take 20 mg by mouth every evening. 06/16/23  Yes Copland, Jacques, MD  cetirizine (ZYRTEC) 10 MG tablet Take 10 mg by mouth at bedtime.   Yes [provider]  Cholecalciferol (VITAMIN D3 PO) Take 2,000 Units by mouth in the morning.  Yes [provider]  famotidine  (PEPCID ) 20 MG tablet Take 20 mg by mouth at bedtime.   Yes [provider]  FLUoxetine  (PROZAC ) 20 MG capsule Take 1 capsule (20 mg total) by mouth daily. 03/24/24  Yes Copland, Jacques, MD  lisinopril  (ZESTRIL ) 40 MG tablet TAKE 1 TABLET BY MOUTH EVERY DAY 12/17/23  Yes Cindie Ole DASEN, MD  meclizine  (ANTIVERT ) 25 MG tablet Take 1 tablet (25 mg total) by mouth 3 (three) times daily as needed for dizziness. 02/27/22  Yes Velma Raisin, MD  Misc Natural Products (BEET ROOT PO) Take 2 each by mouth in the morning. Beet Chews   Yes [provider]  nitroGLYCERIN  (NITROSTAT ) 0.4 MG SL tablet Place 1 tablet (0.4 mg total) under the tongue every 5 (five) minutes as needed for chest pain. 03/26/24  Yes  Lesia Ozell Barter, PA-C  metoprolol  tartrate (LOPRESSOR ) 100 MG tablet Take 1 tablet 2 hours prior to cardiac ct 03/26/24   Lesia Ozell Barter, PA-C    Physical Exam: Vitals:   04/11/24 1100 04/11/24 1533 04/11/24 1600 04/11/24 1700  BP: (!) 141/84 (!) 197/90 (!) 190/95 (!) 199/93  Pulse: 70 66 61 67  Resp: 18 17 19 16   Temp: 98.2 F (36.8 C) 98.7 F (37.1 C)    TempSrc: Oral Oral    SpO2: 98% 97% 97% 98%  Weight:      Height:       Alert awake and oriented x 3. Clear to auscultation. Pupils are equal and round and reactive to light. No nystagmus identified. No focal deficit.  Normal finger-nose-finger.  Data Reviewed: Since last encounter, pertinent lab results CBC and BMP   . I have ordered test including CBC and BMP  . I have discussed pt's care plan and test results with cardiology  . I have ordered imaging abdominal x-ray  .   Family Communication: Family at bedside Primary team communication: Discussed with primary team Thank you very much for involving us  in care of your patient.  Author: Yetta Blanch, MD [facility] 04/11/2024 6:45 PM    To reach us , Look up on care teams to locate the [facility] team or provider name and reach out to them via secure chat or [email or web address]. Between 7PM-7AM, please contact night-coverage. If you still have difficulty reaching the attending provider, please page the Director on Call-DOC for [facility] on amion for assistance.

## 2024-04-11 NOTE — Plan of Care (Signed)
  Problem: Education: Goal: Individualized Educational Video(s) Outcome: Progressing   Problem: Activity: Goal: Ability to return to baseline activity level will improve Outcome: Progressing   Problem: Cardiovascular: Goal: Ability to achieve and maintain adequate cardiovascular perfusion will improve Outcome: Progressing

## 2024-04-12 ENCOUNTER — Inpatient Hospital Stay (HOSPITAL_COMMUNITY): Payer: Medicare (Managed Care)

## 2024-04-12 ENCOUNTER — Telehealth: Payer: Self-pay | Admitting: Cardiology

## 2024-04-12 DIAGNOSIS — K209 Esophagitis, unspecified without bleeding: Secondary | ICD-10-CM | POA: Diagnosis not present

## 2024-04-12 DIAGNOSIS — I25118 Atherosclerotic heart disease of native coronary artery with other forms of angina pectoris: Principal | ICD-10-CM

## 2024-04-12 DIAGNOSIS — I10 Essential (primary) hypertension: Secondary | ICD-10-CM

## 2024-04-12 LAB — CBC
HCT: 33.8 % — ABNORMAL LOW (ref 39.0–52.0)
Hemoglobin: 12 g/dL — ABNORMAL LOW (ref 13.0–17.0)
MCH: 31.4 pg (ref 26.0–34.0)
MCHC: 35.5 g/dL (ref 30.0–36.0)
MCV: 88.5 fL (ref 80.0–100.0)
Platelets: 168 K/uL (ref 150–400)
RBC: 3.82 MIL/uL — ABNORMAL LOW (ref 4.22–5.81)
RDW: 12.1 % (ref 11.5–15.5)
WBC: 12 K/uL — ABNORMAL HIGH (ref 4.0–10.5)
nRBC: 0 % (ref 0.0–0.2)

## 2024-04-12 LAB — BASIC METABOLIC PANEL WITH GFR
Anion gap: 10 (ref 5–15)
BUN: 11 mg/dL (ref 8–23)
CO2: 25 mmol/L (ref 22–32)
Calcium: 8.4 mg/dL — ABNORMAL LOW (ref 8.9–10.3)
Chloride: 104 mmol/L (ref 98–111)
Creatinine, Ser: 0.73 mg/dL (ref 0.61–1.24)
GFR, Estimated: >60 mL/min (ref >60)
Glucose, Bld: 123 mg/dL — ABNORMAL HIGH (ref 70–99)
Potassium: 3.5 mmol/L (ref 3.5–5.1)
Sodium: 139 mmol/L (ref 135–145)

## 2024-04-12 LAB — MAGNESIUM: Magnesium: 2.2 mg/dL (ref 1.7–2.4)

## 2024-04-12 LAB — LIPOPROTEIN A (LPA): Lipoprotein (a): 58.5 nmol/L — ABNORMAL HIGH (ref <75.0)

## 2024-04-12 MED ORDER — AMLODIPINE BESYLATE 5 MG PO TABS
5.0000 mg | ORAL_TABLET | Freq: Every day | ORAL | Status: DC
  Administered 2024-04-12 – 2024-04-13 (×2): 5 mg via ORAL
  Filled 2024-04-12 (×2): qty 1

## 2024-04-12 MED ORDER — CLOPIDOGREL BISULFATE 75 MG PO TABS
75.0000 mg | ORAL_TABLET | Freq: Every day | ORAL | Status: DC
  Administered 2024-04-13: 75 mg via ORAL
  Filled 2024-04-12: qty 1

## 2024-04-12 MED ORDER — CLOPIDOGREL BISULFATE 75 MG PO TABS
600.0000 mg | ORAL_TABLET | Freq: Once | ORAL | Status: AC
Start: 2024-04-12 — End: 2024-04-12
  Administered 2024-04-12: 600 mg via ORAL
  Filled 2024-04-12: qty 8

## 2024-04-12 MED ORDER — APIXABAN 5 MG PO TABS
5.0000 mg | ORAL_TABLET | Freq: Two times a day (BID) | ORAL | Status: DC
  Administered 2024-04-12 – 2024-04-13 (×2): 5 mg via ORAL
  Filled 2024-04-12 (×2): qty 1

## 2024-04-12 MED ORDER — ATORVASTATIN CALCIUM 40 MG PO TABS
40.0000 mg | ORAL_TABLET | Freq: Every day | ORAL | Status: DC
  Administered 2024-04-13: 40 mg via ORAL
  Filled 2024-04-12: qty 1

## 2024-04-12 MED ORDER — POTASSIUM CHLORIDE CRYS ER 20 MEQ PO TBCR
40.0000 meq | EXTENDED_RELEASE_TABLET | Freq: Once | ORAL | Status: AC
  Administered 2024-04-12: 40 meq via ORAL
  Filled 2024-04-12: qty 2

## 2024-04-12 NOTE — TOC CM/SW Note (Signed)
 Transition of Care [facility]) - Inpatient Brief Assessment   Patient Details  Name: Dylan Olsen MRN: [id number] Date of Birth: 06/20/1956  [facility] of [facility]) CM/SW Contact:    Lauraine FORBES Saa, LCSWA Phone Number: 04/12/2024, 9:14 AM   Clinical Narrative:  9:14 AM Per chart review, patient resides at home with spouse. Patient has a PCP and insurance. Patient does not have SNF/HH/DME history. Patient's preferred pharmacy is [facility] [phone] [place]. No TOC needs identified at this time. TOC will continue to follow.  Transition of Care Asessment: Insurance and Status: Insurance coverage has been reviewed Patient has primary care physician: Yes Home environment has been reviewed: Private Residence Prior level of function:: N/A Prior/Current Home Services: No current home services Social Drivers of Health Review: SDOH reviewed no interventions necessary Readmission risk has been reviewed: Yes (Currently Green 12%) Transition of care needs: no transition of care needs at this time

## 2024-04-12 NOTE — Evaluation (Signed)
 Physical Therapy Vestibular Evaluation Patient Details Name: Dylan Olsen MRN: [id number] DOB: December 24, 1955 Today's Date: 04/12/2024  History of Present Illness  68 year old male presented 10/3 to ambulatory surgery for cardiac cath; During the catheterization patient had multiple episodes of nausea and 1 episode of vomiting.  PMH hypertension, controlled hyperlipidemia, CAD, paroxysmal A-fib.  Clinical Impression  Pt admitted with above complications. Vestibular rehab consult appreciated. (See details below.) Hx of BPPV reported but unconfirmed. Has had on and off issues for 10 years and avoids quickly turning head towards left and lying on left side which makes his vertigo worse. + head impulse test on right side. No focal deficits noted. Skew absent. No resting nystagmus. Patient placed in Rt dix hallpike position and developed torsional nystagmus but could not tolerate position due to symptoms. This made it difficult to appreciate details of nystagmus direction, indicating which canal(s) may be involved. Aborted Epley attempt due to severe symptoms in Rt dix hallpike position and not tolerated more than 20 seconds. If imaging negative, suspect this is an acute episode of Rt posterior canalithiasis. Extensive education for patient and follow-up recommendations for treatment if imaging not correlated with symptoms. Will attempt further investigation and treatment tomorrow as schedule permits. Will benefit from outpatient vestibular rehab follow-up at d/c. Pt currently with functional limitations due to the deficits listed below (see PT Problem List). Pt will benefit from acute skilled PT to increase their independence and safety with mobility to allow discharge.         04/12/24 0001  Vestibular Assessment  General Observation Lying in bed with emesis bag held close. A bit anxious.  Symptom Behavior  Subjective history of current problem Reports hx of vertigo for 10 years on and off. Unable to get  under cars to do mechanical work on them due to symptoms. Worse with turning head towards left, cannot lie on left side. Syptoms are sudden in onset with positional movements and resolve quickly but nausea persists. Has seen an MD in the past and was told he has crystals out of place but never sought follow-up care by PT or ENT. He has tried CRM himself but without guidance of a specialist and no improvement in symptoms. Denies hearing changes, tinnitus, or focal symptoms.  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 Denies changes in hearing.  Other Tests  Comments Valsalva normal  Positional Testing  Dix-Hallpike Dix-Hallpike Right  Dix-Hallpike Right  Dix-Hallpike Right Duration Only tolerated position for 20 seconds,  Dix-Hallpike Right Symptoms Other (comment) (+ torsional nystagmus, pt closes eyes and attempts to roll and sit up immediately making it difficult to fully confirm direction of nystagmus.)  Cognition  Cognition Orientation Level Oriented x 4  Positional Sensitivities  Right Hallpike 4       If plan is discharge home, recommend the following: Assist for transportation;Help with stairs or ramp for entrance;Assistance with cooking/housework   Can travel by private vehicle  Equipment Recommendations None recommended by PT  Recommendations for Other Services       Functional  Status Assessment Patient has had a recent decline in their functional status and demonstrates the ability to make significant improvements in function in a reasonable and predictable amount of time.     Precautions / Restrictions Precautions Precautions: Fall Recall of Precautions/Restrictions: Intact Restrictions Weight Bearing Restrictions Per Provider Order: No      Mobility  Bed Mobility Overal bed mobility: Independent             General bed mobility comments: Ind with bed mobility during vestibular assessment. sits EOB safety, + dizziness upon rising.    Transfers                   General transfer comment: Deferred due to nausea after vestibular assessment    Ambulation/Gait                  Stairs            Wheelchair Mobility     Tilt Bed    Modified Rankin (Stroke Patients Only)       Balance Overall balance assessment: Mild deficits observed, not formally tested                                           Pertinent Vitals/Pain Pain Assessment Pain Assessment: No/denies pain    Home Living Family/patient expects to be discharged to:: Private residence Living Arrangements: Spouse/significant other Available Help at Discharge: Family Type of Home: House Home Access: Stairs to enter Entrance Stairs-Rails: None Entrance Stairs-Number of Steps: 1-2 Alternate Level Stairs-Number of Steps: 7 (split level home) Home Layout: Two level Home Equipment: Agricultural consultant (2 wheels);Rollator (4 wheels)      Prior Function Prior Level of Function : Independent/Modified Independent;Driving             Mobility Comments: ind, occasional instability with quick head turns       Extremity/Trunk Assessment   Upper Extremity Assessment Upper Extremity Assessment: Defer to OT evaluation    Lower Extremity Assessment Lower Extremity Assessment: Overall WFL for tasks assessed       Communication    Communication Communication: No apparent difficulties    Cognition Arousal: Alert Behavior During Therapy: WFL for tasks assessed/performed, Anxious   PT - Cognitive impairments: No apparent impairments                         Following commands: Intact       Cueing Cueing Techniques: Verbal cues     General Comments General comments (skin integrity, edema, etc.): see vestibular assessment. Extensive education on findings, symptom awareness, self care, symptom management, positioning, activity modification, and follow-up recs.    Exercises     Assessment/Plan    PT Assessment Patient needs continued PT services  PT Problem List Decreased activity tolerance;Decreased knowledge of use of DME       PT Treatment Interventions DME instruction;Gait training;Stair training;Functional mobility training;Therapeutic activities;Therapeutic exercise;Balance training;Neuromuscular re-education;Patient/family education;Canalith reposition    PT Goals (Current goals can be found in the Care Plan section)  Acute Rehab PT Goals Patient Stated Goal: Get better, stop feeling dizzy PT Goal Formulation: With patient Time For Goal Achievement: 04/26/24 Potential to Achieve Goals: Good Additional Goals Additional Goal #1: Demonstrate ability to  perform epley maneuver - pending which canal is confirmed affected    Frequency Min 2X/week     Co-evaluation               AM-PAC PT 6 Clicks Mobility  Outcome Measure Help needed turning from your back to your side while in a flat bed without using bedrails?: None Help needed moving from lying on your back to sitting on the side of a flat bed without using bedrails?: None Help needed moving to and from a bed to a chair (including a wheelchair)?: None Help needed standing up from a chair using your arms (e.g., wheelchair or bedside chair)?: None Help needed to walk in hospital room?: A Little Help needed climbing 3-5 steps with  a railing? : A Little 6 Click Score: 22    End of Session   Activity Tolerance: Other (comment) (dizziness nausea) Patient left: in bed;with call bell/phone within reach;with bed alarm set;with nursing/sitter in room;with family/visitor present Nurse Communication: Mobility status PT Visit Diagnosis: BPPV;Dizziness and giddiness (R42);Other symptoms and signs involving the nervous system (R29.898);Other abnormalities of gait and mobility (R26.89)    Time: 1000-1036 PT Time Calculation (min) (ACUTE ONLY): 36 min   Charges:   PT Evaluation $PT Eval Low Complexity: 1 Low PT Treatments $Self Care/Home Management: 8-22 PT General Charges $$ ACUTE PT VISIT: 1 Visit         Leontine Roads, PT, DPT [facility]  Rehabilitation Services Physical Therapist Office: [phone] Website: [email or web address]   Leontine GORMAN Roads 04/12/2024, 1:16 PM

## 2024-04-12 NOTE — Progress Notes (Signed)
 Progress Note  [name] Date of Encounter: 04/12/2024 [facility] Cardiologist: None   Interval Summary    No chest pain, reports the nausea/vomiting is better. Just got carafate and did not tolerate that but other POs did fine with. Ordering lunch.   Vital Signs Vitals:   04/11/24 2000 04/11/24 2300 04/12/24 0500 04/12/24 0700  BP: (!) 186/89 (!) 171/86 (!) 170/89 (!) 164/82  Pulse: 64 (!) 54 61 62  Resp: 19 17 17 17   Temp: 98.6 F (37 C) 98 F (36.7 C) 98.6 F (37 C) 98.8 F (37.1 C)  TempSrc: Oral Axillary Oral Oral  SpO2: 95% 95% 97% 96%  Weight:      Height:        Intake/Output Summary (Last 24 hours) at 04/12/2024 0947 Last data filed at 04/12/2024 0529 Gross per 24 hour  Intake 691.46 ml  Output 1420 ml  Net -728.54 ml      04/09/2024    7:15 PM 04/09/2024    7:58 AM 03/26/2024    8:49 AM  Last 3 Weights  Weight (lbs) 193 lb 9 oz 192 lb 192 lb  Weight (kg) 87.8 kg 87.091 kg 87.091 kg      Telemetry/ECG  Sinus Rhythm, few beats of NSVT - Personally Reviewed  Physical Exam  GEN: No acute distress.   Neck: No JVD Cardiac: RRR, no murmurs, rubs, or gallops.  Respiratory: Clear to auscultation bilaterally. GI: Soft, nontender, non-distended  MS: No edema  Assessment & Plan   68 y.o. male with h/o CHF, HTN, and AF s/p PVI and loop recorder who was referred for outpatient cardiac cath after undergoing outpatient CCTa which was abnormal.   CAD -- Underwent outpatient cardiac catheterization 10/3 with complex PCI of high-grade mid LAD lesion treated with shockwave lithotripsy and PCI, did have small dissection at distal edge of stent which was overlapped with 2 additional stents, for total of DES x 3.  Recommendations given for 2 weeks of triple therapy with aspirin, Plavix and Eliquis , then Plavix and Eliquis  for 6 months. -- Given his episodes of vomiting he was transition to IV cangrelor post catheter over the weekend.   Vomiting has significantly improved therefore discussed with MD and pharmacy and will transition to Plavix 600 mg load x 1, and 75 mg daily starting tomorrow. -- Continue aspirin, Plavix, resume Eliquis  5 mg twice daily this evening -- Continue atorvastatin  40 mg daily, lisinopril  40 mg daily  Nausea and vomiting Dizziness History of vertigo -- Developed shortly after cardiac catheterization and persisted over the weekend. -- CT abdomen pelvis with hepatic steatosis, mild splenomegaly and circumferential wall thickening of the distal esophagus concerning for possible esophagitis. -- Seen by internal medicine with recommendations for Carafate and Thorazine.  Patient did not tolerate either of these as he had recurrent vomiting with Carafate and ported hallucinations with Thorazine.  Will DC both -- Will continue Protonix  40 mg IV twice daily today with transition to oral dosing tomorrow. -- Given episode started after his cardiac catheterization will obtain CT head to rule out CVA -- PT evaluation for vertigo   Hypertension -- Blood pressures remain elevated -- Continue lisinopril  40 mg daily, add amlodipine 5 mg daily  Paroxysmal atrial fibrillation s/p ablation '21/[employer] loop recorder NSVT -- Remains in sinus rhythm, few beats of NSVT noted -- As above will plan to resume Eliquis  5 mg daily this evening  Lung nodule -- 6 mm right lower lobe pulm  nodule, 8 mm right lower lobe granuloma. -- Repeat CT chest in 3 to 6 months  Mood disorder -- On Prozac  PTA  Leukocytosis -- WBC 11.5>>11.9>>12.0, reactive? -- follow  Lactic Acidosis -- Lactic 2.2, in the setting of poor po intake and vomiting  Medical Readiness Date: 04/13/2024    For questions or updates, please contact [facility] Please consult [email or web address] for contact info under    Signed, Manuelita Rummer, NP

## 2024-04-12 NOTE — Telephone Encounter (Signed)
 Calling to let our office know that the CT cardiac 2nd impression was found. Please advise

## 2024-04-12 NOTE — Progress Notes (Signed)
 [facility] Progress Note Patient: Dylan Olsen FMW:[id number] DOB: [birth year]  DOA: 04/09/2024 DOS: the patient was seen and examined on 04/12/2024  Brief Hospital Course: The patient with PMH of HLD, GERD, pericarditis, paroxysmal A-fib SP PVI, loop recorder placement, chronic HFpEF presented to cardiology office for follow-up with complaints of chest pain. He underwent CT coronary with coronary calcium  scores of 1223 with evidence of CAD. Presented for Cardiac catheterization was performed on 10/3.SABRA  During the catheterization patient had multiple episodes of nausea and 1 episode of vomiting. Patient has been on cangrelor IV infusion since then as he has not been able to tolerate oral medications and oral diet. Initially this was thought to be secondary to medication side effect but patient continues to have nausea and vomiting on 10/4 as well as 10/5. 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 CT abdomen shows evidence of esophagitis.  CT head ordered by the primary team.  Vestibular rehab recommended outpatient.  Assessment and Plan: Intractable nausea and vomiting. GERD/gastritis. Likely in the setting of gastritis. Will treat with PPI twice daily IV. Also add Carafate 4 times daily. Continue Zofran  as needed.  Switch to Compazine as needed for refractory nausea. CT scan shows evidence of esophagitis without any other acute abnormality. If symptom does not improve may require GI consultation.  Severe  hiccups. Visual hallucination due to Thorazine Patient was started on scheduled Thorazine although developed hallucination.  His saw tractor on the wall and a person in the window.  Currently the medication have been discontinued.  BPPV. Vestibular therapy was consulted.  While performing testing patient cannot tolerate more than 20 seconds.  Test was aborted.  But most likely confirmed BPPV.  Outpatient vestibular rehab  recommended.  CAD. HLD. Primary is cardiology, management per cardiology. Underwent cardiac catheterization with stenting. On aspirin and cangrelor infusion for now. Also on Lipitor.  Paroxysmal A-fib. On Eliquis  at home. Management per cardiology   Mood disorder. Could be under stress after undergoing cardiac workup and treatment. On Prozac  at home.  Will continue. Add Ativan as needed.  Hypertensive urgency. Blood pressure elevated. Resumption of lisinopril . With sinus bradycardia metoprolol  on hold. Management per cardiology.  Hypokalemia. Replacing orally.  Lactic acidosis. Mild. From poor p.o. intake. No evidence of sepsis. Encourage oral hydration.  Leukocytosis. Secondary to stress reaction. Monitor for now. No evidence of acute infection function  Lung nodule. 6 mm right lower lobe pulm nodule. 8 mm right lower lobe granuloma. Repeat CT chest in 3 to 6 months.   Subjective: Improving nausea and vomiting.  No fever no chills.  No abdominal pain.  Physical Exam: Clear to auscultation. S1-S2 present Bowel sound present. Nontender. No edema. No focal deficit.  Data Reviewed: I have Reviewed nursing notes, Vitals, and Lab results. Since last encounter, pertinent lab results CBC and BMP   . I have ordered test including CBC and BMP  . I have discussed pt's care plan and test results with cardiology  .   Disposition: Status is: Inpatient Remains inpatient appropriate because: Monitor for improvement in oral diet tolerance  apixaban  (ELIQUIS ) tablet 5 mg    Family Communication: Family at bedside Level of care: Telemetry Cardiac   Vitals:   04/12/24 0500 04/12/24 0700 04/12/24 1137 04/12/24 1500  BP: (!) 170/89 (!) 164/82 (!) 175/82 (!) 174/82  Pulse: 61 62 60 69  Resp: 17 17 18 15   Temp: 98.6 F (37 C) 98.8 F (  37.1 C) 98.2 F (36.8 C) 98.1 F (36.7 C)  TempSrc: Oral Oral Oral Oral  SpO2: 97% 96% 96% 97%  Weight:      Height:          Author: Yetta Blanch, MD 04/12/2024 7:19 PM  Please look on [email or web address] to find out who is on call.

## 2024-04-12 NOTE — Progress Notes (Signed)
 Mobility Specialist Progress Note;   04/12/24 1517  Mobility  Activity Ambulated with assistance  Level of Assistance Minimal assist, patient does 75% or more  Assistive Device None  Distance Ambulated (ft) 80 ft  Activity Response Tolerated well  Mobility Referral Yes  Mobility visit 1 Mobility  Mobility Specialist Start Time (ACUTE ONLY) 1517  Mobility Specialist Stop Time (ACUTE ONLY) 1524  Mobility Specialist Time Calculation (min) (ACUTE ONLY) 7 min   RN requesting pt to ambulate, pt agreeable. Required MinA during ambulation d/t some unsteadiness w/o AD. VSS throughout. Distance limited d/t nausea, RN notified. Pt requested to sit on EoB and left with all needs met, call bell in reach. RN notified.   Lauraine Erm Mobility Specialist Please contact via SecureChat or [employer] [phone]

## 2024-04-12 NOTE — Progress Notes (Signed)
 Pt seen with the intent to ambulate however pt is reporting mild nausea and request to ambulate at later time. Will f/u if time allows/   Garen FORBES Candy MS, ACSM-CEP  04/12/2024 10:19 AM

## 2024-04-13 ENCOUNTER — Other Ambulatory Visit (HOSPITAL_COMMUNITY): Payer: Self-pay

## 2024-04-13 DIAGNOSIS — I25118 Atherosclerotic heart disease of native coronary artery with other forms of angina pectoris: Secondary | ICD-10-CM | POA: Diagnosis not present

## 2024-04-13 DIAGNOSIS — I10 Essential (primary) hypertension: Secondary | ICD-10-CM | POA: Diagnosis not present

## 2024-04-13 DIAGNOSIS — I480 Paroxysmal atrial fibrillation: Secondary | ICD-10-CM

## 2024-04-13 DIAGNOSIS — H8110 Benign paroxysmal vertigo, unspecified ear: Secondary | ICD-10-CM

## 2024-04-13 LAB — CBC
HCT: 33 % — ABNORMAL LOW (ref 39.0–52.0)
Hemoglobin: 12 g/dL — ABNORMAL LOW (ref 13.0–17.0)
MCH: 31.7 pg (ref 26.0–34.0)
MCHC: 36.4 g/dL — ABNORMAL HIGH (ref 30.0–36.0)
MCV: 87.1 fL (ref 80.0–100.0)
Platelets: 163 K/uL (ref 150–400)
RBC: 3.79 MIL/uL — ABNORMAL LOW (ref 4.22–5.81)
RDW: 11.9 % (ref 11.5–15.5)
WBC: 8.3 K/uL (ref 4.0–10.5)
nRBC: 0 % (ref 0.0–0.2)

## 2024-04-13 LAB — BASIC METABOLIC PANEL WITH GFR
Anion gap: 9 (ref 5–15)
BUN: 11 mg/dL (ref 8–23)
CO2: 25 mmol/L (ref 22–32)
Calcium: 8.1 mg/dL — ABNORMAL LOW (ref 8.9–10.3)
Chloride: 104 mmol/L (ref 98–111)
Creatinine, Ser: 0.79 mg/dL (ref 0.61–1.24)
GFR, Estimated: >60 mL/min (ref >60)
Glucose, Bld: 108 mg/dL — ABNORMAL HIGH (ref 70–99)
Potassium: 3.4 mmol/L — ABNORMAL LOW (ref 3.5–5.1)
Sodium: 138 mmol/L (ref 135–145)

## 2024-04-13 LAB — MAGNESIUM: Magnesium: 2.1 mg/dL (ref 1.7–2.4)

## 2024-04-13 MED ORDER — AMLODIPINE BESYLATE 5 MG PO TABS
5.0000 mg | ORAL_TABLET | Freq: Once | ORAL | Status: AC
  Administered 2024-04-13: 5 mg via ORAL
  Filled 2024-04-13: qty 1

## 2024-04-13 MED ORDER — ASPIRIN 81 MG PO CHEW
81.0000 mg | CHEWABLE_TABLET | Freq: Every day | ORAL | 0 refills | Status: DC
  Filled 2024-04-13: qty 7, 7d supply, fill #0

## 2024-04-13 MED ORDER — POTASSIUM CHLORIDE CRYS ER 20 MEQ PO TBCR
40.0000 meq | EXTENDED_RELEASE_TABLET | Freq: Once | ORAL | Status: AC
Start: 2024-04-13 — End: 2024-04-13
  Administered 2024-04-13: 40 meq via ORAL
  Filled 2024-04-13: qty 2

## 2024-04-13 MED ORDER — AMLODIPINE BESYLATE 10 MG PO TABS
10.0000 mg | ORAL_TABLET | Freq: Every day | ORAL | 3 refills | Status: DC
  Filled 2024-04-13: qty 90, 90d supply, fill #0

## 2024-04-13 MED ORDER — PANTOPRAZOLE SODIUM 40 MG PO TBEC
40.0000 mg | DELAYED_RELEASE_TABLET | Freq: Two times a day (BID) | ORAL | 2 refills | Status: DC
  Filled 2024-04-13: qty 60, 30d supply, fill #0

## 2024-04-13 MED ORDER — CLOPIDOGREL BISULFATE 75 MG PO TABS
75.0000 mg | ORAL_TABLET | Freq: Every day | ORAL | 3 refills | Status: DC
  Filled 2024-04-13: qty 90, 90d supply, fill #0

## 2024-04-13 MED ORDER — MECLIZINE HCL 25 MG PO TABS
25.0000 mg | ORAL_TABLET | Freq: Three times a day (TID) | ORAL | 3 refills | Status: AC | PRN
Start: 2024-04-13 — End: ?
  Filled 2024-04-13: qty 30, 10d supply, fill #0

## 2024-04-13 NOTE — TOC Transition Note (Signed)
 Transition of Care [facility]) - Discharge Note   Patient Details  Name: Dylan Olsen MRN: [id number] Date of Birth: 06/30/1956  [facility] CM/SW Contact:  Marval Gell, RN Phone Number: 04/13/2024, 9:52 AM   Clinical Narrative:     Referral placed for outpatient vestibular PT at [facility]. Added to AVS, spoke w patient and he is aware to call for faster scheduling.  Final next level of care: Home/Self Care Barriers to Discharge: No Barriers Identified   Patient Goals and CMS Choice            Discharge Placement                       Discharge Plan and Services Additional resources added to the After Visit Summary for                  DME Arranged: N/A                    Social Drivers of Health (SDOH) Interventions SDOH Screenings   Food Insecurity: No Food Insecurity (04/09/2024)  Housing: Low Risk  (04/09/2024)  Transportation Needs: No Transportation Needs (04/09/2024)  Utilities: Not At Risk (04/09/2024)  Depression (PHQ2-9): Medium Risk (03/24/2024)  Social Connections: Socially Integrated (04/09/2024)  Tobacco Use: High Risk (04/09/2024)     Readmission Risk Interventions     No data to display

## 2024-04-13 NOTE — Discharge Summary (Signed)
 Discharge Summary   Patient ID: Dylan Olsen MRN: [id number]; DOB: Dec 19, 1955  Admit date: 04/09/2024 Discharge date: 04/13/2024  PCP:  [name], MD   [facility] Providers Cardiologist:  Newman JINNY Lawrence, MD  Electrophysiologist:  OLE ONEIDA HOLTS, MD      Discharge Diagnoses  Principal Problem:   Angina pectoris Active Problems:   Persistent atrial fibrillation (HCC)   Mixed hyperlipidemia   Primary hypertension   Hypercoagulable state due to persistent atrial fibrillation [facility])   Diagnostic Studies/Procedures   LHC 04/09/24:   Mid LAD lesion is 95% stenosed.   Prox RCA lesion is 20% stenosed.   A stent was successfully placed.   Post intervention, there is a 0% residual stenosis.   1.  Complex PCI of high-grade mid LAD lesion treated with shockwave lithotripsy and one 3.0 x 32 mm Synergy XD stent postdilated with a 3.25 mm Platinum balloon.  Due to small dissection at the distal edge of the stent this was overlapped with 2 additional stents a 2.5 x 16 mm Synergy XD stent and a 2.25 x 8 mm Synergy XD stent.   2.  Mild left circumflex and RCA disease   Summary: Due to ongoing nausea and vomiting and questionable absorption of Plavix that was administered, the patient will be treated with cangrelor infusion.  Plan on Plavix load in the morning and resuming Eliquis  at that time.  Plan on 2 weeks of triple therapy with Plavix, aspirin and Eliquis  then Plavix and Eliquis  for 6 months then Eliquis  monotherapy thereafter.  Overnight observation with telemetry. _____________  Echo 04/10/24:  1. No LV thrombus by Definity. Left ventricular ejection fraction, by  estimation, is >75%. The left ventricle has hyperdynamic function. The  left ventricle has no regional wall motion abnormalities. Left ventricular  diastolic parameters were normal.   2. Right ventricular systolic function is low normal. The right  ventricular size is moderately enlarged. Tricuspid  regurgitation signal is  inadequate for assessing PA pressure.   3. Right atrial size was moderately dilated.   4. The mitral valve is grossly normal. Trivial mitral valve  regurgitation. No evidence of mitral stenosis.   5. The aortic valve was not well visualized. Aortic valve regurgitation  is not visualized. Mild aortic valve stenosis. Aortic valve area, by VTI  measures 1.96 cm. Aortic valve mean gradient measures 9.0 mmHg. Aortic  valve Vmax measures 2.17 m/s.   6. The inferior vena cava is dilated in size with <50% respiratory  variability, suggesting right atrial pressure of 15 mmHg.   7. Agitated saline contrast bubble study was inconclusive due to  suboptimal image.   8. Increased flow velocities may be secondary to anemia, thyrotoxicosis,  hyperdynamic or high flow state.    History of Present Illness   Dylan Olsen is a 68 y.o. male with h/o CHF, HTN, and AF s/p PVI and loop recorder seen today for routine electrophysiology followup.   Since last being seen in our clinic the patient reports doing symptoms worrisome for angina.    Earlier this week he was rolling the trash cans up a hill when he had left sided, squeezing chest pain that radiated into his arm.  He laid down and had to rest for about 30 minutes prior to symptoms resolving. He has had multiple other episodes with exertion, most lasting 5-12 minutes. He his a prior smoker, and intermittently uses cigars. Otherwise, no syncope or edema. Does occasionally have palpitations, as recently  as this week.  Nothing caught on loop.    Review of systems complete and found to be negative unless listed in HPI.    Hospital Course   Consultants: none  CAD Patient presented for scheduled outpatient heart catheterization.  He underwent complex PCI of high-grade mid LAD lesion treated with shockwave lithotripsy and 3.0 x 32 mm DES.  Due to a small dissection at the distal edge of the stent this was overlapped with 2  additional stents: 2.5 x 16 mm and 2.25 x 8 mm stents.  Mild LCx and RCA disease treated medically.  He was planned for 2 weeks of triple therapy with Plavix, aspirin, and Eliquis  followed by Plavix and Eliquis  for 6 months then Eliquis  monotherapy thereafter.  He was initially planned for same-day discharge; however, due to ongoing nausea and vomiting with questionable absorption of Plavix, he was admitted and treated with cangrelor infusion.  He was subsequently weaned from cangrelor infusion, nausea and vomiting subsided.  Continue 40 mg Lipitor.  Afib with ILR S/p ablation 2021 Per EP: Low burden, no A-fib since July.  Doing well on 5 mg Eliquis  twice daily.   HFrecEF Hypertension LVEF recovered and felt potentially tachycardia mediated. Echocardiogram this admission with continued preserved LVEF, but hyperdynamic.  Moderate RAE. Discharged on 40 mg lisinopril , 5 mg amlodipine   Mild aortic stenosis Noted on echocardiogram this admission.   Patient seen and examined by Dr. Elmira deemed stable for discharge.  Follow-up was arranged.      Did the patient have an acute coronary syndrome (MI, NSTEMI, STEMI, etc) this admission?:  No                               Did the patient have a percutaneous coronary intervention (stent / angioplasty)?:  Yes.     Cath/PCI Registry Performance & Quality Measures: Aspirin prescribed? - Yes ADP Receptor Inhibitor (Plavix/Clopidogrel, Brilinta/Ticagrelor or Effient/Prasugrel) prescribed (includes medically managed patients)? - Yes High Intensity Statin (Lipitor 40-80mg  or Crestor 20-40mg ) prescribed? - Yes For EF <40%, was ACEI/ARB prescribed? - Yes For EF <40%, Aldosterone Antagonist (Spironolactone or Eplerenone) prescribed? - Not Applicable (EF >/= 40%) Cardiac Rehab Phase II ordered? - Yes       The patient will be scheduled for a TOC follow up appointment in 7-14 days.  A message has been sent to the [facility] and Scheduling  Pool at the office where the patient should be seen for follow up.  _____________  Discharge Vitals Blood pressure (!) 156/91, pulse (!) 58, temperature 97.9 F (36.6 C), temperature source Oral, resp. rate 19, height 5' 11 (1.803 m), weight 87.8 kg, SpO2 95%.  Filed Weights   04/09/24 0758 04/09/24 1915  Weight: 87.1 kg 87.8 kg    Labs & Radiologic Studies  CBC Recent Labs    04/12/24 0450 04/13/24 0337  WBC 12.0* 8.3  HGB 12.0* 12.0*  HCT 33.8* 33.0*  MCV 88.5 87.1  PLT 168 163   Basic Metabolic Panel Recent Labs    89/93/74 0450 04/13/24 0337  NA 139 138  K 3.5 3.4*  CL 104 104  CO2 25 25  GLUCOSE 123* 108*  BUN 11 11  CREATININE 0.73 0.79  CALCIUM  8.4* 8.1*  MG 2.2 2.1   Liver Function Tests Recent Labs    04/11/24 1116  AST 25  ALT 28  ALKPHOS 69  BILITOT 1.1  PROT 6.2*  ALBUMIN 3.9  No results for input(s): LIPASE, AMYLASE in the last 72 hours. High Sensitivity Troponin:   No results for input(s): TROPONINIHS in the last 720 hours.  No results for input(s): TRNPT in the last 720 hours.  BNP Invalid input(s): POCBNP No results for input(s): PROBNP in the last 72 hours.  No results for input(s): BNP in the last 72 hours.  D-Dimer No results for input(s): DDIMER in the last 72 hours. Hemoglobin A1C No results for input(s): HGBA1C in the last 72 hours. Fasting Lipid Panel No results for input(s): CHOL, HDL, LDLCALC, TRIG, CHOLHDL, LDLDIRECT in the last 72 hours. Lipoprotein (a)  Date/Time Value Ref Range Status  04/10/2024 02:15 AM 58.5 (H) <75.0 nmol/L Final    Comment:    (NOTE) This test was developed and its performance characteristics determined by [facility]. It has not been cleared or approved by the Food and Drug Administration. Note:  Values greater than or equal to 75.0 nmol/L may       indicate an independent risk factor for CHD,       but must be evaluated with caution when applied       to  non-Caucasian populations due to the       influence of genetic factors on Lp(a) across       ethnicities. Performed At: [facility] [address] [place], [place] [id number] Jennette Shorter MD Ey:[phone]     Thyroid  Function Tests No results for input(s): TSH, T4TOTAL, T3FREE, THYROIDAB in the last 72 hours.  Invalid input(s): FREET3 _____________  CT HEAD WO CONTRAST ( ) Result Date: 04/12/2024 EXAM: CT HEAD WITHOUT CONTRAST 04/12/2024 04:02:56 PM TECHNIQUE: CT of the head was performed without the administration of intravenous contrast. Automated exposure control, iterative reconstruction, and/or weight based adjustment of the mA/kV was utilized to reduce the radiation dose to as low as reasonably achievable. COMPARISON: None available. CLINICAL HISTORY: Headache, sudden, severe. FINDINGS: BRAIN AND VENTRICLES: No acute hemorrhage. No evidence of acute infarct. Chronic ischemic white matter changes. Old left basal ganglia small vessel infarct. No hydrocephalus. No extra-axial collection. No mass effect or midline shift. ORBITS: No acute abnormality. SINUSES: No acute abnormality. SOFT TISSUES AND SKULL: No acute soft tissue abnormality. No skull fracture. IMPRESSION: 1. No acute intracranial abnormality. 2. Chronic ischemic white matter changes and old left basal ganglia small vessel infarct. Electronically signed by: Franky Stanford MD 04/12/2024 09:29 PM EDT RP Workstation: [id number]   CT ABDOMEN PELVIS W CONTRAST Result Date: 04/11/2024 CLINICAL DATA:  Abdominal pain, acute, nonlocalized nausea and vomiting EXAM: CT ABDOMEN AND PELVIS WITH CONTRAST TECHNIQUE: Multidetector CT imaging of the abdomen and pelvis was performed using the standard protocol following bolus administration of intravenous contrast. RADIATION DOSE REDUCTION: This exam was performed according to the departmental dose-optimization program which includes automated exposure control, adjustment of the mA  and/or kV according to patient size and/or use of iterative reconstruction technique. CONTRAST:  75mL OMNIPAQUE  IOHEXOL  350 MG/ML SOLN COMPARISON:  April 01, 2024. FINDINGS: Lower chest: Scattered pulmonary nodules are noted, measuring up to 5 mm. Recommend management as outlined in exam from April 01, 2024. No acute abnormality. Hepatobiliary: Hepatic steatosis. Layering high density within the gallbladder, likely gallstones versus excreted contrast. Pancreas: Unremarkable. No pancreatic ductal dilatation or surrounding inflammatory changes. Spleen: Mild splenomegaly spanning 13.9 cm. Adrenals/Urinary Tract: Adrenal glands are unremarkable. Kidneys enhance symmetrically. No hydronephrosis. No obstructing nephrolithiasis. Subcentimeter hypodense lesions are too small to accurately characterize. Favored scarring of the inferior interpolar RIGHT kidney versus  a fat containing mass such as an angiomyolipoma spanning 14 mm and (series 3, image 39). Bladder is unremarkable. Stomach/Bowel: No evidence of bowel obstruction. Appendix is normal. There is circumferential wall thickening of the distal esophagus. Vascular/Lymphatic: Atherosclerotic calcifications of the aorta. Mild ectasia of the infrarenal abdominal aorta without aneurysmal dilation to 2.5 cm. Dense atherosclerotic calcifications at the origin of the celiac with mild post stenotic dilation. No suspicious adenopathy. Reproductive: Coarse calcifications of the prostate. Other: No free air or free fluid. Musculoskeletal: Status post posterior fixation and decompression of the lower lumbar spine. IMPRESSION: 1. No CT etiology for acute abdominal pain identified. 2. Hepatic steatosis. 3. Mild splenomegaly. 4. Circumferential wall thickening of the distal esophagus. Recommend correlation with symptoms of esophagitis. 5. 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 Aortic Atherosclerosis (ICD10-I70.0). Electronically Signed   By:  Corean Salter M.D.   On: 04/11/2024 11:25   DG Abd Portable 1V Result Date: 04/11/2024 EXAM: 1 VIEW XRAY OF THE ABDOMEN 04/11/2024 08:17:00 AM COMPARISON: None available. CLINICAL HISTORY: intractable nausea and vomiting FINDINGS: BOWEL: Nonobstructive bowel gas pattern. SOFT TISSUES: Multiple wires and leads overlying the abdomen. No opaque urinary calculi. BONES: Lumbosacral surgical hardware noted. No acute osseous abnormality. IMPRESSION: 1. No acute abdominal abnormality. Electronically signed by: Evalene Coho MD 04/11/2024 08:23 AM EDT RP Workstation: [id number]   ECHOCARDIOGRAM COMPLETE BUBBLE STUDY Result Date: 04/10/2024    ECHOCARDIOGRAM REPORT   [name] Date of Exam: 04/10/2024 Medical Rec #:  [id number]        Height:       71.0 in Accession #:    [id number]       Weight:       193.6 lb Date of Birth:  07-Oct-1955        BSA:          2.079 m Patient Age:    68 years         BP:           150/72 mmHg Patient Gender: M                HR:           59 bpm. Exam Location:  Inpatient Procedure: 2D Echo, Cardiac Doppler, Color Doppler, Intracardiac Opacification            Agent and Saline Contrast Bubble Study (Both Spectral and Color Flow            Doppler were utilized during procedure). STAT ECHO Indications:    Ischemic Cardiomyopathy  History:        Patient has prior history of Echocardiogram examinations, most                 recent 04/08/2020. Pericarditis, Arrythmias:Atrial Fibrillation;                 Risk Factors:Hypertension and Dyslipidemia.  Sonographer:    Logan Shove RDCS Referring Phys: [id number] OLE DASEN LAMBERT IMPRESSIONS  1. No LV thrombus by Definity. Left ventricular ejection fraction, by estimation, is >75%. The left ventricle has hyperdynamic function. The left ventricle has no regional wall motion abnormalities. Left ventricular diastolic parameters were normal.  2. Right ventricular systolic function is low normal. The right ventricular size is  moderately enlarged. Tricuspid regurgitation signal is inadequate for assessing PA pressure.  3. Right atrial size was moderately dilated.  4. The mitral valve is grossly normal. Trivial mitral valve regurgitation. No  evidence of mitral stenosis.  5. The aortic valve was not well visualized. Aortic valve regurgitation is not visualized. Mild aortic valve stenosis. Aortic valve area, by VTI measures 1.96 cm. Aortic valve mean gradient measures 9.0 mmHg. Aortic valve Vmax measures 2.17 m/s.  6. The inferior vena cava is dilated in size with <50% respiratory variability, suggesting right atrial pressure of 15 mmHg.  7. Agitated saline contrast bubble study was inconclusive due to suboptimal image.  8. Increased flow velocities may be secondary to anemia, thyrotoxicosis, hyperdynamic or high flow state. FINDINGS  Left Ventricle: No LV thrombus by Definity. Left ventricular ejection fraction, by estimation, is >75%. The left ventricle has hyperdynamic function. The left ventricle has no regional wall motion abnormalities. Definity contrast agent was given IV to delineate the left ventricular endocardial borders. Strain was performed and the global longitudinal strain is indeterminate. The left ventricular internal cavity size was normal in size. There is no left ventricular hypertrophy. Left ventricular diastolic parameters were normal. Right Ventricle: The right ventricular size is moderately enlarged. No increase in right ventricular wall thickness. Right ventricular systolic function is low normal. Tricuspid regurgitation signal is inadequate for assessing PA pressure. Left Atrium: Left atrial size was normal in size. Right Atrium: Right atrial size was moderately dilated. Pericardium: There is no evidence of pericardial effusion. Mitral Valve: The mitral valve is grossly normal. Trivial mitral valve regurgitation. No evidence of mitral valve stenosis. Tricuspid Valve: The tricuspid valve is normal in structure.  Tricuspid valve regurgitation is trivial. No evidence of tricuspid stenosis. Aortic Valve: The aortic valve was not well visualized. Aortic valve regurgitation is not visualized. Mild aortic stenosis is present. Aortic valve mean gradient measures 9.0 mmHg. Aortic valve peak gradient measures 18.8 mmHg. Aortic valve area, by VTI measures 1.96 cm. Pulmonic Valve: The pulmonic valve was not well visualized. Pulmonic valve regurgitation is not visualized. No evidence of pulmonic stenosis. Aorta: The aortic root and ascending aorta are structurally normal, with no evidence of dilitation. Venous: The inferior vena cava is dilated in size with less than 50% respiratory variability, suggesting right atrial pressure of 15 mmHg. IAS/Shunts: No atrial level shunt detected by color flow Doppler. Agitated saline contrast was given intravenously to evaluate for intracardiac shunting. Agitated saline contrast bubble study was inconclusive due to suboptimal image. Additional Comments: 3D was performed not requiring image post processing on an independent workstation and was indeterminate.  LEFT VENTRICLE PLAX 2D LVIDd:         4.10 cm      Diastology LVIDs:         2.50 cm      LV e' medial:    10.70 cm/s LV PW:         0.90 cm      LV E/e' medial:  9.2 LV IVS:        1.00 cm      LV e' lateral:   12.90 cm/s LVOT diam:     1.80 cm      LV E/e' lateral: 7.6 LV SV:         88 LV SV Index:   42 LVOT Area:     2.54 cm  LV Volumes (MOD) LV vol d, MOD A2C: 142.0 ml LV vol d, MOD A4C: 128.0 ml LV vol s, MOD A2C: 36.5 ml LV vol s, MOD A4C: 41.2 ml LV SV MOD A2C:     105.5 ml LV SV MOD A4C:     128.0 ml LV  SV MOD BP:      97.4 ml RIGHT VENTRICLE             IVC RV Basal diam:  4.60 cm     IVC diam: 2.60 cm RV S prime:     17.00 cm/s TAPSE (M-mode): 3.4 cm LEFT ATRIUM             Index        RIGHT ATRIUM           Index LA diam:        3.20 cm 1.54 cm/m   RA Area:     23.70 cm LA Vol (A2C):   50.6 ml 24.33 ml/m  RA Volume:   80.40 ml   38.66 ml/m LA Vol (A4C):   59.6 ml 28.66 ml/m LA Biplane Vol: 55.1 ml 26.50 ml/m  AORTIC VALVE AV Area (Vmax):    1.86 cm AV Area (Vmean):   1.85 cm AV Area (VTI):     1.96 cm AV Vmax:           217.00 cm/s AV Vmean:          137.000 cm/s AV VTI:            0.447 m AV Peak Grad:      18.8 mmHg AV Mean Grad:      9.0 mmHg LVOT Vmax:         159.00 cm/s LVOT Vmean:        99.500 cm/s LVOT VTI:          0.345 m LVOT/AV VTI ratio: 0.77  AORTA Ao Root diam: 3.20 cm Ao Asc diam:  2.90 cm MITRAL VALVE MV Area (PHT): 3.48 cm    SHUNTS MV Decel Time: 218 msec    Systemic VTI:  0.34 m MV E velocity: 98.00 cm/s  Systemic Diam: 1.80 cm MV A velocity: 78.60 cm/s MV E/A ratio:  1.25 Vishnu Priya Mallipeddi Electronically signed by Diannah Late Mallipeddi Signature Date/Time: 04/10/2024/10:12:59 AM    Final    CT CORONARY MORPH W/CTA COR W/SCORE W/CA W/CM &/OR WO/CM Addendum Date: 04/09/2024 ADDENDUM REPORT: 04/09/2024 22:19 EXAM: OVER-READ INTERPRETATION  CT CHEST The following report is an over-read performed by radiologist Dr. Selinda Saas [facility] on 04/09/2024. This over-read does not include interpretation of cardiac or coronary anatomy or pathology. The coronary CTA interpretation by the cardiologist is attached. COMPARISON:  03/24/2024 chest radiograph. FINDINGS: Cardiovascular: Normal heart size. No significant pericardial effusion/thickening. Great vessels are normal in course and caliber. No central pulmonary emboli. Mediastinum/Nodes: Unremarkable esophagus. No pathologically enlarged mediastinal or hilar lymph nodes. Lungs/Pleura: No pneumothorax. No pleural effusion. No acute consolidative airspace disease or lung masses. Calcified 8 mm anterior right lower lobe granuloma. A few scattered solid pulmonary nodules in both lungs, largest 6 mm in the subpleural peripheral anterior right lower lobe on image 92. Upper abdomen: No acute abnormality. Musculoskeletal: No aggressive appearing focal  osseous lesions. Mild thoracic spondylosis. IMPRESSION: Scattered solid pulmonary nodules, largest 6 mm in the right lower lobe. Non-contrast chest CT at 3-6 months is recommended. If the nodules are stable at time of repeat CT, then future CT at 18-24 months (from today's scan) is considered optional for low-risk patients, but is recommended for high-risk patients. This recommendation follows the consensus statement: Guidelines for Management of Incidental Pulmonary Nodules Detected on CT Images: From the Fleischner Society 2017; Radiology 2017; 284:228-243. These results will be called to the ordering clinician or representative  by the Radiologist Assistant, and communication documented in the PACS or [employer]. Electronically Signed   By: Selinda DELENA Blue M.D.   On: 04/09/2024 22:19   Result Date: 04/09/2024 CLINICAL DATA:  This is a 68 year old male with anginal symptoms EXAM: Cardiac/Coronary  CTA TECHNIQUE: The patient was scanned on a [employer]. FINDINGS: A 100 kV prospective scan was triggered in the descending thoracic aorta at 111 HU's. Axial non-contrast 3 mm slices were carried out through the heart. The data set was analyzed on a dedicated work station and scored using the Agatson method. Gantry rotation speed was 250 msecs and collimation was .6 mm. No beta blockade and 0.8 mg of sl NTG was given. The 3D data set was reconstructed in 5% intervals of the 67-82 % of the R-R cycle. Diastolic phases were analyzed on a dedicated work station using MPR, MIP and VRT modes. The patient received 80 cc of contrast. Aorta: The proximal ascending aorta is 36 mm. No calcifications. No dissection. Aortic Valve:  Trileaflet.  No calcifications. Coronary Arteries:  Normal coronary origin.  Right dominance. RCA is a large dominant artery that gives rise to PDA and PLA. There is a focal mild (25-49%) calcified plaque in the proximal RCA. The proximal to mid RCA with a mild soft plaque. The mid RCA with  minimal (<24%) soft plaque. The distal RCA with moderate (50-69%) in the distal RCA. Left main is a large artery that gives rise to LAD and LCX arteries. LAD is a large vessel. The proximal LAD with mild focal calcified plaque. Below the plaque lies multiple minimal mixed plaques. In the mid LAD lies a severe (70-99%). Below this plaques are mild mixed plaques. Minimal focal calcified in the distal LAD. D1 with mild focal calcified plaque in the proximal portion of the vessel. Multiple mild plaques in the mid D1 vessel. D2 with mild focal calcified plaque in the proximal portion of the vessel. LCX is a non-dominant artery that gives rise to one large OM1 branch. There is minimal focal calcified plaque in the proximal of vessel. The mid vessel with mild focal calcified plaque. OM1 with mild focal calcified plaque. The mid and distal with minimal diffuse calcification. Coronary Calcium  Score: Left main: 0 Left anterior descending artery: 770 Left circumflex artery: 240 Right coronary artery: 214 Total: 1223 Percentile: 90 Other findings: Normal pulmonary vein drainage into the left atrium. Normal left atrial appendage without a thrombus. Normal size of the pulmonary artery. IMPRESSION: 1. Coronary calcium  score of 1223. This was 90 percentile for age and sex matched control. 2. Normal coronary origin with right dominance. 3. CAD-RADS 4 Severe stenosis. (70-99% or > 50% left main). Cardiac catheterization or CT FFR is recommended. Consider symptom-guided anti-ischemic pharmacotherapy as well as risk factor modification per guideline directed care. The noncardiac portion of this study will be interpreted in separate report by the radiologist. Electronically Signed: By: Kardie  Tobb D.O. On: 04/01/2024 15:35   CARDIAC CATHETERIZATION Result Date: 04/09/2024   Mid LAD lesion is 95% stenosed.   Prox RCA lesion is 20% stenosed.   A stent was successfully placed.   Post intervention, there is a 0% residual stenosis. 1.   Complex PCI of high-grade mid LAD lesion treated with shockwave lithotripsy and one 3.0 x 32 mm Synergy XD stent postdilated with a 3.25 mm [place] balloon.  Due to small dissection at the distal edge of the stent this was overlapped with 2 additional stents a 2.5  x 16 mm Synergy XD stent and a 2.25 x 8 mm Synergy XD stent.  2.  Mild left circumflex and RCA disease Summary: Due to ongoing nausea and vomiting and questionable absorption of Plavix that was administered, the patient will be treated with cangrelor infusion.  Plan on Plavix load in the morning and resuming Eliquis  at that time.  Plan on 2 weeks of triple therapy with Plavix, aspirin and Eliquis  then Plavix and Eliquis  for 6 months then Eliquis  monotherapy thereafter.  Overnight observation with telemetry.   CUP PACEART REMOTE DEVICE CHECK Result Date: 04/05/2024 ILR summary report received. Battery status OK. Normal device function. No new symptom, tachy, brady, or pause episodes. No new AF episodes. Monthly summary reports and ROV/PRN ML, CVRS  DG Chest 2 View Result Date: 03/25/2024 EXAM: 2 VIEW(S) XRAY OF THE CHEST 03/24/2024 11:53:52 AM COMPARISON: 04/08/2020 CLINICAL HISTORY: SOB FINDINGS: LINES, TUBES AND DEVICES: Loop recorder noted. LUNGS AND PLEURA: 7 mm calcified granuloma in right lower lung zone. No pulmonary edema. No pleural effusion. No pneumothorax. HEART AND MEDIASTINUM: Calcified aorta. No acute abnormality of the cardiac and mediastinal silhouettes. BONES AND SOFT TISSUES: Degenerative changes in thoracic spine. No acute osseous abnormality. IMPRESSION: 1. No acute findings. 2. Degenerative changes in the thoracic spine. Electronically signed by: [name] Calk MD 03/25/2024 11:44 AM EDT RP Workstation: [id number]    Disposition Pt is being discharged home today in good condition.  Follow-up Plans & Appointments  Follow-up Information     [facility]. Call.   Specialty: Rehabilitation Why: Referral  has been placed electronicallly for you please call for fastest scheduling Contact information: [address] [place]  [address] [phone]               Discharge Instructions     Amb Referral to Cardiac Rehabilitation   Complete by: As directed    Diagnosis:  PTCA Coronary Stents     After initial evaluation and assessments completed: Virtual Based Care may be provided alone or in conjunction with Phase 2 Cardiac Rehab based on patient barriers.: Yes   Intensive Cardiac Rehabilitation (ICR) [facility] location only OR Traditional Cardiac Rehabilitation (TCR) *If criteria for ICR are not met will enroll in TCR [facility] only): Yes   Diet - low sodium heart healthy   Complete by: As directed    Increase activity slowly   Complete by: As directed        Discharge Medications Allergies as of 04/13/2024       Reactions   Other Other (See Comments)   Pt reports he coded after having anesthesia with a back surgery  Can not recall what medicine        Medication List     STOP taking these medications    metoprolol  tartrate 100 MG tablet Commonly known as: LOPRESSOR        TAKE these medications    acetaminophen  500 MG tablet Commonly known as: TYLENOL  Take 500 mg by mouth every 6 (six) hours as needed (pain).   amLODipine 10 MG tablet Commonly known as: NORVASC Take 1 tablet (10 mg total) by mouth daily. Start taking on: April 14, 2024   apixaban  5 MG Tabs tablet Commonly known as: Eliquis  Take 1 tablet (5 mg total) by mouth 2 (two) times daily.   Aspirin Low Dose 81 MG chewable tablet Generic drug: aspirin Chew 1 tablet (81 mg total) by mouth daily. Start taking on: April 14, 2024   atorvastatin  20  MG tablet Commonly known as: LIPITOR TAKE 1 TABLET BY MOUTH EVERY DAY What changed: when to take this   BEET ROOT PO Take 2 each by mouth in the morning. Beet Chews   cetirizine 10 MG tablet Commonly known as: ZYRTEC Take 10 mg by  mouth at bedtime.   clopidogrel 75 MG tablet Commonly known as: PLAVIX Take 1 tablet (75 mg total) by mouth daily. Start taking on: April 14, 2024   famotidine  20 MG tablet Commonly known as: PEPCID  Take 20 mg by mouth at bedtime.   FLUoxetine  20 MG capsule Commonly known as: PROZAC  Take 1 capsule (20 mg total) by mouth daily.   lisinopril  40 MG tablet Commonly known as: ZESTRIL  TAKE 1 TABLET BY MOUTH EVERY DAY   meclizine  25 MG tablet Commonly known as: ANTIVERT  Take 1 tablet (25 mg total) by mouth 3 (three) times daily as needed for dizziness.   nitroGLYCERIN  0.4 MG SL tablet Commonly known as: Nitrostat  Place 1 tablet (0.4 mg total) under the tongue every 5 (five) minutes as needed for chest pain.   pantoprazole  40 MG tablet Commonly known as: Protonix  Take 1 tablet (40 mg total) by mouth 2 (two) times daily.   VITAMIN D3 PO Take 2,000 Units by mouth in the morning.         Outstanding Labs/Studies    Duration of Discharge Encounter: APP Time: 20 minutes   Signed, Jon Nat Hails, PA 04/13/2024, 11:22 AM

## 2024-04-13 NOTE — Care Management Important Message (Signed)
 Important Message  Patient Details  Name: Dylan Olsen MRN: [id number] Date of Birth: [birth year]   Important Message Given:  Yes - Medicare IM  Patient left prior to IM delivery will mail a copy of this to patient home address.    [name] 04/13/2024, 12:02 PM

## 2024-04-13 NOTE — Plan of Care (Signed)

## 2024-04-13 NOTE — Progress Notes (Signed)
 Went over discharge paperwork with patient and wife. All questions answered. PIV and telemetry removed, all belongings at bedside.

## 2024-04-13 NOTE — Progress Notes (Signed)
 [facility] PROGRESS NOTE  Patient: Dylan Olsen FMW:[id number]   PCP: [name], MD DOB: 11-03-1955   DOA: 04/09/2024   DOS: 04/13/2024    Subjective: No nausea no vomiting.  Tolerating oral diet.  No fever no chills.  Objective:  Vitals:   04/12/24 1500 04/12/24 2128 04/13/24 0629 04/13/24 0806  BP: (!) 174/82 (!) 148/82 (!) 155/89 (!) 156/91  Pulse: 69 62 (!) 55 (!) 58  Resp: 15 16 16 19   Temp: 98.1 F (36.7 C) 98.9 F (37.2 C) 98.8 F (37.1 C) 97.9 F (36.6 C)  TempSrc: Oral Oral Oral Oral  SpO2: 97% 93% 95% 95%  Weight:      Height:       Clear to auscultation. S1-S2 present present bowel sound present No edema.  Assessment and plan: Intractable nausea and vomiting likely in the setting of BPPV. I placed outpatient referral order for vestibular rehabilitation. Continue PPI twice daily.  Discussed with cardiology.  Author: Yetta Blanch, MD [facility] 04/13/2024 6:18 PM   If 7PM-7AM, please contact night-coverage at [email or web address]

## 2024-04-14 ENCOUNTER — Telehealth (HOSPITAL_COMMUNITY): Payer: Self-pay

## 2024-04-14 NOTE — Telephone Encounter (Signed)
 Phase II referral. Patient is not sure he is interested in program.  F/u 10/16.

## 2024-04-17 ENCOUNTER — Other Ambulatory Visit: Payer: Self-pay | Admitting: Family Medicine

## 2024-04-17 ENCOUNTER — Other Ambulatory Visit: Payer: Self-pay | Admitting: Cardiology

## 2024-04-17 DIAGNOSIS — I10 Essential (primary) hypertension: Secondary | ICD-10-CM

## 2024-04-17 DIAGNOSIS — I4819 Other persistent atrial fibrillation: Secondary | ICD-10-CM

## 2024-04-20 NOTE — Progress Notes (Unsigned)
 Cardiology Clinic Note   [name] Date of Encounter: 04/22/2024  Primary Care Provider:  [name], MD Primary Cardiologist:  Newman JINNY Lawrence, MD  Patient Profile    Dylan Olsen 68 year old male presents to the clinic today for follow-up of his coronary artery disease status post PCI on 04/09/2024.  Past Medical History    Past Medical History:  Diagnosis Date   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 04/2020 following Afib ablation.   Persistent atrial fibrillation (HCC)    a. 04/2020 s/p PVI.   Primary hypertension 02/06/2021   Past Surgical History:  Procedure Laterality Date   ATRIAL FIBRILLATION ABLATION N/A 04/07/2020   Procedure: ATRIAL FIBRILLATION ABLATION;  Surgeon: Cindie Ole DASEN, MD;  Location[facility] INVASIVE CV LAB;  Service: Cardiovascular;  Laterality: N/A;   BACK SURGERY     BUBBLE STUDY  04/06/2020   Procedure: BUBBLE STUDY;  Surgeon: Santo Stanly LABOR, MD;  Location: [facility] ENDOSCOPY;  Service: Cardiovascular;;   CORONARY IMAGING/OCT N/A 04/09/2024   Procedure: CORONARY IMAGING/OCT;  Surgeon: Wendel Lurena POUR, MD;  Location: [facility] INVASIVE CV LAB;  Service: Cardiovascular;  Laterality: N/A;   CORONARY LITHOTRIPSY N/A 04/09/2024   Procedure: CORONARY LITHOTRIPSY;  Surgeon: Wendel Lurena POUR, MD;  [facility]: [facility];  Service: Cardiovascular;  Laterality: N/A;   CORONARY STENT INTERVENTION N/A 04/09/2024   Procedure: CORONARY STENT INTERVENTION;  Surgeon: Wendel Lurena POUR, MD;  [facility]: [facility];  Service: Cardiovascular;  Laterality: N/A;   FOOT SURGERY     KNEE SURGERY Left    LEFT HEART CATH AND CORONARY ANGIOGRAPHY N/A 04/09/2024   Procedure: LEFT HEART CATH AND CORONARY ANGIOGRAPHY;  Surgeon: Wendel Lurena POUR, MD;  [facility];  Service: Cardiovascular;   Laterality: N/A;   TEE WITHOUT CARDIOVERSION N/A 04/06/2020   Procedure: TRANSESOPHAGEAL ECHOCARDIOGRAM (TEE);  Surgeon: Santo Stanly LABOR, MD;  Location[facility] ENDOSCOPY;  Service: Cardiovascular;  Laterality: N/A;    Allergies  Allergies  Allergen Reactions   Other Other (See Comments)    Pt reports he coded after having anesthesia with a back surgery  Can not recall what medicine    History of Present Illness    Dylan Olsen has a PMH of angina pectoris, persistent atrial fibrillation, mixed hyperlipidemia, hypertension, hypercoagulable state, and coronary artery disease.  For his AF he is status post PVI and ILR.  He also follows with the EP.  Echocardiogram 04/10/2024 showed LVEF greater than 75%, normal diastolic parameters, moderately enlarged right ventricle, moderately dilated RA, trivial mitral valve regurgitation mild aortic valve stenosis with a mean gradient of 9 mmHg and aortic valve V-max pressure of 2.17.  The week prior to his LHC he noted a squeezing chest pain that radiated to his arm while rolling his trash cans up a hill.  He laid down and rested for 30 minutes.  His symptoms resolved.  He reported multiple episodes with exertion.  These lasted for 5-12 minutes.  He denied lower extremity swelling and syncopal events.  He did occasionally note palpitations.  These were not noted on his loop recorder.  He underwent LHC on 04/09/2024.  He was noted to have mid LAD lesion of 95%.  He was noted to have proximal RCA lesion of 20%.  He received PCI to his  mid LAD lesion.  He was noted to have small dissection at the distal edge of the stent.  He required 2 additional stents which were overlapping.  He was noted to have mild left circumflex and RCA disease.  His hospital course was complicated due to nausea and vomiting.  There was question of absorption of his Plavix.  He was admitted and treated with cangrelor infusion.  His nausea and vomiting subsided.  He was continued on  40 mg atorvastatin .  It was planned for triple therapy Plavix aspirin and Eliquis  for 2 weeks followed by Plavix and Eliquis  for 6 months then Eliquis  monotherapy thereafter.  He presents to the clinic today for follow-up evaluation and states he continues to slowly recover after being in the hospital.  He notes that he still has some vertigo type symptoms and that his appetite has not yet returned.  He has also not been sleeping well.  His blood pressure today is 100/70.  His wife has been checking his blood pressure at home.  She has been using a wrist cuff.  It appears that this has been giving elevated readings.  He reports compliance with his other medications and is now taking Eliquis  and Plavix.  He is no longer taking his aspirin.  He denies bleeding issues.  We reviewed his cardiac catheterization.  He and his wife expressed understanding.  I will reduce his amlodipine to 7.5 mg daily, have him increase his physical activity as tolerated (may start cardiac rehab), continue bland diet, and plan follow-up for 2 to 3 months.  Today he denies chest pain, shortness of breath, melena, hematuria, hemoptysis, diaphoresis, weakness, presyncope, syncope, orthopnea, and PND.     Home Medications    Prior to Admission medications   Medication Sig Start Date End Date Taking? Authorizing Provider  acetaminophen  (TYLENOL ) 500 MG tablet Take 500 mg by mouth every 6 (six) hours as needed (pain).    [provider]  amLODipine (NORVASC) 10 MG tablet Take 1 tablet (10 mg total) by mouth daily. 04/14/24   Patwardhan, Newman PARAS, MD  apixaban  (ELIQUIS ) 5 MG TABS tablet Take 1 tablet (5 mg total) by mouth 2 (two) times daily. 12/23/23   Fenton, Clint R, PA  aspirin 81 MG chewable tablet Chew 1 tablet (81 mg total) by mouth daily. 04/14/24   Patwardhan, Manish J, MD  atorvastatin  (LIPITOR) 20 MG tablet TAKE 1 TABLET BY MOUTH EVERY DAY Patient taking differently: Take 20 mg by mouth every evening. 06/16/23    Copland, Jacques, MD  cetirizine (ZYRTEC) 10 MG tablet Take 10 mg by mouth at bedtime.    [provider]  Cholecalciferol (VITAMIN D3 PO) Take 2,000 Units by mouth in the morning.    [provider]  clopidogrel (PLAVIX) 75 MG tablet Take 1 tablet (75 mg total) by mouth daily. 04/14/24   Patwardhan, Newman PARAS, MD  famotidine  (PEPCID ) 20 MG tablet Take 20 mg by mouth at bedtime.    [provider]  FLUoxetine  (PROZAC ) 20 MG capsule TAKE 1 CAPSULE BY MOUTH EVERY DAY 04/19/24   Copland, Jacques, MD  lisinopril  (ZESTRIL ) 40 MG tablet TAKE 1 TABLET BY MOUTH EVERY DAY 12/17/23   Cindie Ole DASEN, MD  meclizine  (ANTIVERT ) 25 MG tablet Take 1 tablet (25 mg total) by mouth 3 (three) times daily as needed for dizziness. 04/13/24   Patwardhan, Newman PARAS, MD  Misc Natural Products (BEET ROOT PO) Take 2 each by mouth in the morning. Beet Chews  [provider]  nitroGLYCERIN  (NITROSTAT ) 0.4 MG SL tablet Place 1 tablet (0.4 mg total) under the tongue every 5 (five) minutes as needed for chest pain. 03/26/24   Lesia Ozell Barter, PA-C  pantoprazole  (PROTONIX ) 40 MG tablet Take 1 tablet (40 mg total) by mouth 2 (two) times daily. 04/13/24 04/13/25  Elmira Newman PARAS, MD    Family History    Family History  Problem Relation Age of Onset   Heart disease Father    Heart attack Father 69   Cancer Cousin        unknown primary   Stomach cancer Neg Hx    Rectal cancer Neg Hx    Kidney disease Neg Hx    Colon cancer Neg Hx    Colon polyps Neg Hx    He indicated that his father is deceased. He indicated that the status of his cousin is unknown. He indicated that the status of his neg hx is unknown.  Social History    Social History   Socioeconomic History   Marital status: Married    Spouse name: [name]   Number of children: 2   Years of education: high school   Highest education level: Not on file  Occupational History   Occupation: Curator  Tobacco Use    Smoking status: Some Days    Types: Cigars   Smokeless tobacco: Never   Tobacco comments:    a few times a year  Vaping Use   Vaping status: Never Used  Substance and Sexual Activity   Alcohol use: Yes    Comment: less than monthly   Drug use: Yes    Types: Marijuana    Comment: weekend   Sexual activity: Yes    Birth control/protection: Post-menopausal  Other Topics Concern   Not on file  Social History Narrative   01/13/20   From: [place], [place] originally - moved to [place] 1996   Living: with wife [name] (1996), and step daughter (special needs)   Work: English as a second language teacher for [employer]      Family: 2 children - [place]av and [place] - in [place], good relationship and 3 grandchildren and 2 step children      Enjoys: drag racing, football      Exercise: not currently - walking and mobile job   Diet: not great, sometimes a little better      Safety   Seat belts: Yes    Guns: Yes  and secure   Safe in relationships: Yes    Social Drivers of Corporate investment banker Strain: Not on file  Food Insecurity: No Food Insecurity (04/09/2024)   Hunger Vital Sign    Worried About Running Out of Food in the Last Year: Never true    Ran Out of Food in the Last Year: Never true  Transportation Needs: No Transportation Needs (04/09/2024)   PRAPARE - Administrator, Civil Service (Medical): No    Lack of Transportation (Non-Medical): No  Physical Activity: Not on file  Stress: Not on file  Social Connections: Socially Integrated (04/09/2024)   Social Connection and Isolation Panel    Frequency of Communication with Friends and Family: Twice a week    Frequency of Social Gatherings with Friends and Family: Once a week    Attends Religious Services: 1 to 4 times per year    Active Member of [employer] or Organizations: Yes    Attends Banker Meetings: 1 to 4 times per year  Marital Status: Married  Catering manager Violence: Not At Risk (04/09/2024)   Humiliation, Afraid,  Rape, and Kick questionnaire    Fear of Current or Ex-Partner: No    Emotionally Abused: No    Physically Abused: No    Sexually Abused: No     Review of Systems    General:  No chills, fever, night sweats or weight changes.  Cardiovascular:  No chest pain, dyspnea on exertion, edema, orthopnea, palpitations, paroxysmal nocturnal dyspnea. Dermatological: No rash, lesions/masses Respiratory: No cough, dyspnea Urologic: No hematuria, dysuria Abdominal:   No nausea, vomiting, diarrhea, bright red blood per rectum, melena, or hematemesis Neurologic:  No visual changes, wkns, changes in mental status. All other systems reviewed and are otherwise negative except as noted above.  Physical Exam    VS:  BP 100/70   Pulse 76   Ht 5' 10 (1.778 m)   Wt 181 lb (82.1 kg)   SpO2 98%   BMI 25.97 kg/m  , BMI Body mass index is 25.97 kg/m. GEN: Well nourished, well developed, in no acute distress. HEENT: normal. Neck: Supple, no JVD, carotid bruits, or masses. Cardiac: RRR, no murmurs, rubs, or gallops. No clubbing, cyanosis, edema.  Radials/DP/PT 2+ and equal bilaterally.  Respiratory:  Respirations regular and unlabored, clear to auscultation bilaterally. GI: Soft, nontender, nondistended, BS + x 4. MS: no deformity or atrophy. Skin: warm and dry, no rash. Neuro:  Strength and sensation are intact. Psych: Normal affect.  Accessory Clinical Findings    Recent Labs: 03/24/2024: TSH 2.59 04/11/2024: ALT 28 04/13/2024: BUN 11; Creatinine, Ser 0.79; Hemoglobin 12.0; Magnesium 2.1; Platelets 163; Potassium 3.4; Sodium 138   Recent Lipid Panel    Component Value Date/Time   CHOL 168 03/24/2024 1149   TRIG 332.0 (H) 03/24/2024 1149   HDL 38.70 (L) 03/24/2024 1149   CHOLHDL 4 03/24/2024 1149   VLDL 66.4 (H) 03/24/2024 1149   LDLCALC 63 03/24/2024 1149   LDLDIRECT 84.0 02/12/2022 1010         ECG personally reviewed by me today- EKG Interpretation Date/Time:  Thursday April 22 2024 14:19:33 EDT Ventricular Rate:  78 PR Interval:  184 QRS Duration:  98 QT Interval:  376 QTC Calculation: 428 R Axis:   -7  Text Interpretation: Normal sinus rhythm Incomplete right bundle branch block When compared with ECG of 10-Apr-2024 07:33, No significant change was found Confirmed by Emelia Hazy [phone]) on 04/22/2024 2:21:09 PM   Echocardiogram 10/21  IMPRESSIONS     1. Limited study to R/O pericardial effusion; no effusion noted.   2. Left ventricular ejection fraction, by estimation, is 50 to 55%. The  left ventricle has low normal function. The left ventricle has no regional  wall motion abnormalities. There is moderate left ventricular hypertrophy.  Left ventricular diastolic  function could not be evaluated.   3. Right ventricular systolic function is normal. The right ventricular  size is normal.   4. The mitral valve is normal in structure. Trivial mitral valve  regurgitation. No evidence of mitral stenosis.   5. The aortic valve is tricuspid. Aortic valve regurgitation is not  visualized. Mild aortic valve sclerosis is present, with no evidence of  aortic valve stenosis.   6. The inferior vena cava is normal in size with greater than 50%  respiratory variability, suggesting right atrial pressure of 3 mmHg.   FINDINGS   Left Ventricle: Left ventricular ejection fraction, by estimation, is 50  to 55%. The left ventricle has  low normal function. The left ventricle has  no regional wall motion abnormalities. The left ventricular internal  cavity size was normal in size.  There is moderate left ventricular hypertrophy. Left ventricular diastolic  function could not be evaluated.   Right Ventricle: The right ventricular size is normal. Right ventricular  systolic function is normal.   Left Atrium: Left atrial size was normal in size.   Right Atrium: Right atrial size was normal in size.   Pericardium: There is no evidence of pericardial effusion.    Mitral Valve: The mitral valve is normal in structure. Trivial mitral  valve regurgitation. No evidence of mitral valve stenosis.   Tricuspid Valve: The tricuspid valve is normal in structure. Tricuspid  valve regurgitation is trivial. No evidence of tricuspid stenosis.   Aortic Valve: The aortic valve is tricuspid. Aortic valve regurgitation is  not visualized. Mild aortic valve sclerosis is present, with no evidence  of aortic valve stenosis.   Pulmonic Valve: The pulmonic valve was not well visualized. Pulmonic valve  regurgitation is not visualized. No evidence of pulmonic stenosis.   Aorta: The aortic root is normal in size and structure.   Venous: The inferior vena cava is normal in size with greater than 50%  respiratory variability, suggesting right atrial pressure of 3 mmHg.    Additional Comments: Limited study to R/O pericardial effusion; no  effusion noted.    LHC 04/09/2024    Mid LAD lesion is 95% stenosed.   Prox RCA lesion is 20% stenosed.   A stent was successfully placed.   Post intervention, there is a 0% residual stenosis.   1.  Complex PCI of high-grade mid LAD lesion treated with shockwave lithotripsy and one 3.0 x 32 mm Synergy XD stent postdilated with a 3.25 mm Rote balloon.  Due to small dissection at the distal edge of the stent this was overlapped with 2 additional stents a 2.5 x 16 mm Synergy XD stent and a 2.25 x 8 mm Synergy XD stent.   2.  Mild left circumflex and RCA disease   Summary: Due to ongoing nausea and vomiting and questionable absorption of Plavix that was administered, the patient will be treated with cangrelor infusion.  Plan on Plavix load in the morning and resuming Eliquis  at that time.  Plan on 2 weeks of triple therapy with Plavix, aspirin and Eliquis  then Plavix and Eliquis  for 6 months then Eliquis  monotherapy thereafter.  Overnight observation with telemetry.  Diagnostic Dominance: Right  Intervention       Assessment &  Plan   1.  Coronary artery disease-denies further episodes of chest discomfort since being discharged from the hospital.  Previously noted angina with increased physical activity.  Underwent LHC and received PCI with DES to his mid LAD.  Required 2 additional overlapping stents for small dissection at distal end of initial stent.planned for triple therapy Plavix aspirin and Eliquis  for 2 weeks followed by Plavix and Eliquis  for 6 months then Eliquis  monotherapy thereafter. Continue atorvastatin , Plavix, Eliquis , amlodipine Heart healthy low-sodium diet Increase physical activity as tolerated Decrease amlodipine to 7.5 mg daily May start cardiac rehab  Hyperlipidemia-LDL 63 on 03/24/2024.  LP(a) 58.5 on 04/10/2024. High-fiber diet Continue atorvastatin   HFrEF-no increased DOE or activity intolerance.  LVEF noted to be recovered.  Felt to be reduced in the setting of tachycardia.  Noted to be hyperdynamic.  Also noted to have moderate RAE. Continue lisinopril , amlodipine Heart healthy low-sodium diet  Aortic stenosis-noted to have mean  gradient of 9 mmHg with aortic valve V-max pressure of 2.17. Maintain physical activity Plan for repeat echocardiogram when clinically indicated  Atrial fibrillation-he is status post ablation 2021.  Patient with ILR.  Heart rate today 76. Follows with EP  Disposition: Follow-up with Dr. Elmira or me in 2-3 months.   Josefa HERO. [place] Want NP-C     04/22/2024, 2:56 PM [facility] [address] [place], [place] [address] Office [phone]    Notice: This dictation was prepared with Dragon dictation along with smaller phrase technology. Any transcriptional errors that result from this process are unintentional and may not be corrected upon review.   I spent 14 minutes examining this patient, reviewing medications, and using patient centered shared decision making involving their cardiac care.   I spent  20  minutes reviewing past medical history,  medications, and prior cardiac tests.

## 2024-04-22 ENCOUNTER — Encounter: Payer: Self-pay | Admitting: General Practice

## 2024-04-22 ENCOUNTER — Ambulatory Visit: Payer: Medicare (Managed Care) | Attending: General Practice | Admitting: General Practice

## 2024-04-22 VITALS — BP 100/70 | HR 76 | Ht 70.0 in | Wt 181.0 lb

## 2024-04-22 DIAGNOSIS — I350 Nonrheumatic aortic (valve) stenosis: Secondary | ICD-10-CM

## 2024-04-22 DIAGNOSIS — I4819 Other persistent atrial fibrillation: Secondary | ICD-10-CM | POA: Diagnosis not present

## 2024-04-22 DIAGNOSIS — I2510 Atherosclerotic heart disease of native coronary artery without angina pectoris: Secondary | ICD-10-CM | POA: Diagnosis not present

## 2024-04-22 DIAGNOSIS — E782 Mixed hyperlipidemia: Secondary | ICD-10-CM

## 2024-04-22 DIAGNOSIS — I5020 Unspecified systolic (congestive) heart failure: Secondary | ICD-10-CM | POA: Diagnosis not present

## 2024-04-22 MED ORDER — AMLODIPINE BESYLATE 5 MG PO TABS: 7.5000 mg | ORAL_TABLET | Freq: Every day | ORAL | 2 refills | Status: DC

## 2024-04-22 NOTE — Therapy (Incomplete)
 OUTPATIENT PHYSICAL THERAPY VESTIBULAR EVALUATION     [name] MRN: [id number] DOB:1956/03/15, 68 y.o., male Today's Date: 04/22/2024  END OF SESSION:   Past Medical History:  Diagnosis Date   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 04/2020 following Afib ablation.   Persistent atrial fibrillation (HCC)    a. 04/2020 s/p PVI.   Primary hypertension 02/06/2021   Past Surgical History:  Procedure Laterality Date   ATRIAL FIBRILLATION ABLATION N/A 04/07/2020   Procedure: ATRIAL FIBRILLATION ABLATION;  Surgeon: Cindie Ole DASEN, MD;  [facility];  Service: Cardiovascular;  Laterality: N/A;   BACK SURGERY     BUBBLE STUDY  04/06/2020   Procedure: BUBBLE STUDY;  Surgeon: Santo Stanly LABOR, MD;  Location: [facility];  Service: Cardiovascular;;   CORONARY IMAGING/OCT N/A 04/09/2024   Procedure: CORONARY IMAGING/OCT;  Surgeon: Wendel Lurena POUR, MD;  [facility];  Service: Cardiovascular;  Laterality: N/A;   CORONARY LITHOTRIPSY N/A 04/09/2024   Procedure: CORONARY LITHOTRIPSY;  Surgeon: Wendel Lurena POUR, MD;  [facility];  Service: Cardiovascular;  Laterality: N/A;   CORONARY STENT INTERVENTION N/A 04/09/2024   Procedure: CORONARY STENT INTERVENTION;  Surgeon: Wendel Lurena POUR, MD;  [facility];  Service: Cardiovascular;  Laterality: N/A;   FOOT SURGERY     KNEE SURGERY Left    LEFT HEART CATH AND CORONARY ANGIOGRAPHY N/A 04/09/2024   Procedure: LEFT HEART CATH AND CORONARY ANGIOGRAPHY;  Surgeon: Wendel Lurena POUR, MD;  [facility];  Service: Cardiovascular;  Laterality: N/A;   TEE WITHOUT CARDIOVERSION N/A 04/06/2020   Procedure: TRANSESOPHAGEAL ECHOCARDIOGRAM (TEE);  Surgeon: Santo Stanly LABOR, MD;  Location: [facility]COPY;  Service: Cardiovascular;   Laterality: N/A;   Patient Active Problem List   Diagnosis Date Noted   Angina pectoris 04/09/2024   Hypercoagulable state due to persistent atrial fibrillation (HCC) 10/01/2023   Chronic vertigo 02/20/2023   Primary hypertension 02/06/2021   Mixed hyperlipidemia 04/09/2020   GERD (gastroesophageal reflux disease) 01/13/2020   Seasonal allergies 01/13/2020   Erectile dysfunction 01/13/2020   Persistent atrial fibrillation (HCC) 01/13/2020    PCP: [name], MD  REFERRING PROVIDER: Tobie Yetta HERO, MD  REFERRING DIAG: H81.13 (ICD-10-CM) - Vertigo, benign paroxysmal, bilateral  THERAPY DIAG:  No diagnosis found.  ONSET DATE: 04/13/2024  Rationale for Evaluation and Treatment: {HABREHAB:27488}  SUBJECTIVE:   SUBJECTIVE STATEMENT: *** Pt accompanied by: {accompnied:27141}  PERTINENT HISTORY: 68 year old male presented 10/3 to ambulatory surgery for cardiac cath; During the catheterization patient had multiple episodes of nausea and 1 episode of vomiting.   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 Has had on and off issues for 10 years and avoids quickly turning head towards left and lying on left side which makes his vertigo worse   PAIN:  Are you having pain? {OPRCPAIN:27236}  PRECAUTIONS: {Therapy precautions:24002}  RED FLAGS: {PT Red Flags:29287}   WEIGHT BEARING RESTRICTIONS: {Yes ***/No:24003}  FALLS: Has patient fallen in last 6 months? {fallsyesno:27318}  LIVING ENVIRONMENT: Lives with: {OPRC lives with:25569::lives with their family} Lives in: {Lives in:25570} Stairs: {opstairs:27293} Has following equipment at home: {Assistive devices:23999}  PLOF: {PLOF:24004}  PATIENT GOALS: ***  OBJECTIVE:  Note: Objective measures were completed at Evaluation unless otherwise noted.  DIAGNOSTIC FINDINGS: CT  head 04/12/24  IMPRESSION: 1. No acute intracranial abnormality. 2. Chronic  ischemic white matter changes and old left basal ganglia small vessel infarct.  COGNITION: Overall cognitive status: {cognition:24006}   SENSATION: {sensation:27233}  EDEMA:  {edema:24020}  MUSCLE TONE:  {LE tone:25568}  DTRs:  {DTR SITE:24025}  POSTURE:  {posture:25561}  Cervical ROM:    {AROM/PROM:27142} A/PROM (deg) eval  Flexion   Extension   Right lateral flexion   Left lateral flexion   Right rotation   Left rotation   (Blank rows = not tested)  STRENGTH: ***  LOWER EXTREMITY MMT:   MMT Right eval Left eval  Hip flexion    Hip abduction    Hip adduction    Hip internal rotation    Hip external rotation    Knee flexion    Knee extension    Ankle dorsiflexion    Ankle plantarflexion    Ankle inversion    Ankle eversion    (Blank rows = not tested)  BED MOBILITY:  {Bed mobility:24027}  TRANSFERS: Assistive device utilized: {Assistive devices:23999}  Sit to stand: {Levels of assistance:24026} Stand to sit: {Levels of assistance:24026} Chair to chair: {Levels of assistance:24026} Floor: {Levels of assistance:24026}  RAMP: {Levels of assistance:24026}  CURB: {Levels of assistance:24026}  GAIT: Gait pattern: {gait characteristics:25376} Distance walked: *** Assistive device utilized: {Assistive devices:23999} Level of assistance: {Levels of assistance:24026} Comments: ***  FUNCTIONAL TESTS:  {Functional tests:24029}  PATIENT SURVEYS:  {rehab surveys:24030}  VESTIBULAR ASSESSMENT:  GENERAL OBSERVATION: ***   SYMPTOM BEHAVIOR:  Subjective history: ***  Non-Vestibular symptoms: {nonvestibular symptoms:25260}  Type of dizziness: {Type of Dizziness:25255}  Frequency: ***  Duration: ***  Aggravating factors: {Aggravating Factors:25258}  Relieving factors: {Relieving Factors:25259}  Progression of symptoms: {DESC; BETTER/WORSE:18575}  OCULOMOTOR EXAM:  Ocular Alignment: {Ocular Alignment:25262}  Ocular ROM: {RANGE OF  MOTION:21649}  Spontaneous Nystagmus: {Spontaneous nystagmus:25263}  Gaze-Induced Nystagmus: {gaze-induced nystagmus:25264}  Smooth Pursuits: {smooth pursuit:25265}  Saccades: {saccades:25266}  Convergence/Divergence: *** cm   Cover-cross-cover test: {cover test:33756}  FRENZEL - FIXATION SUPRESSED:  Ocular Alignment: {Ocular Alignment:25262}  Spontaneous Nystagmus: {Spontaneous nystagmus:25263}  Gaze-Induced Nystagmus: {gaze-induced nystagmus:25264}  Horizontal head shaking - induced nystagmus: {head shaking induced nystagmus:25267}  Vertical head shaking - induced nystagmus: {head shaking induced nystagmus:25267}  Positional tests: {Positional tests:25271}  Pressure tests: {frenzel pressure tests:25268}  VESTIBULAR - OCULAR REFLEX:   Slow VOR: {slow VOR:25290}  VOR Cancellation: {vor cancellation:25291}  Head-Impulse Test: {head impulse test:25272}  Dynamic Visual Acuity: {dynamic visual acuity:25273}   POSITIONAL TESTING: {Positional tests:25271}  MOTION SENSITIVITY:  Motion Sensitivity Quotient Intensity: 0 = none, 1 = Lightheaded, 2 = Mild, 3 = Moderate, 4 = Severe, 5 = Vomiting  Intensity  1. Sitting to supine   2. Supine to L side   3. Supine to R side   4. Supine to sitting   5. L Hallpike-Dix   6. Up from L    7. R Hallpike-Dix   8. Up from R    9. Sitting, head tipped to L knee   10. Head up from L knee   11. Sitting, head tipped to R knee   12. Head up from R knee   13. Sitting head turns x5   14.Sitting head nods x5   15. In stance, 180 turn to L    16. In stance, 180 turn to R     OTHOSTATICS: {Exam; orthostatics:31331}  FUNCTIONAL GAIT: {Functional tests:24029}  TREATMENT DATE: ***   Canalith Repositioning:  {Canalith Repositioning:25283} Gaze Adaptation:  {gaze adaptation:25286} Habituation:  {habituation:25288} Other:  ***  PATIENT EDUCATION: Education details: *** Person educated: {Person educated:25204} Education method: {Education Method:25205} Education comprehension: {Education Comprehension:25206}  HOME EXERCISE PROGRAM:  GOALS: Goals reviewed with patient? {yes/no:20286}  SHORT TERM GOALS: Target date: ***  *** Baseline: Goal status: {GOALSTATUS:25110}  2.  *** Baseline:  Goal status: {GOALSTATUS:25110}  3.  *** Baseline:  Goal status: {GOALSTATUS:25110}  4.  *** Baseline:  Goal status: {GOALSTATUS:25110}  5.  *** Baseline:  Goal status: {GOALSTATUS:25110}  6.  *** Baseline:  Goal status: {GOALSTATUS:25110}  LONG TERM GOALS: Target date: ***  *** Baseline:  Goal status: {GOALSTATUS:25110}  2.  *** Baseline:  Goal status: {GOALSTATUS:25110}  3.  *** Baseline:  Goal status: {GOALSTATUS:25110}  4.  *** Baseline:  Goal status: {GOALSTATUS:25110}  5.  *** Baseline:  Goal status: {GOALSTATUS:25110}  6.  *** Baseline:  Goal status: {GOALSTATUS:25110}  ASSESSMENT:  CLINICAL IMPRESSION: Patient is a *** y.o. *** who was seen today for physical therapy evaluation and treatment for ***.   OBJECTIVE IMPAIRMENTS: {opptimpairments:25111}.   ACTIVITY LIMITATIONS: {activitylimitations:27494}  PARTICIPATION LIMITATIONS: {participationrestrictions:25113}  PERSONAL FACTORS: {Personal factors:25162} are also affecting patient's functional outcome.   REHAB POTENTIAL: {rehabpotential:25112}  CLINICAL DECISION MAKING: {clinical decision making:25114}  EVALUATION COMPLEXITY: {Evaluation complexity:25115}   PLAN:  PT FREQUENCY: {rehab frequency:25116}  PT DURATION: {rehab duration:25117}  PLANNED INTERVENTIONS: {rehab planned interventions:25118::97110-Therapeutic exercises,97530- Therapeutic [phone]- Neuromuscular re-education,97535- Self Rjmz,02859- Manual therapy,Patient/Family education}  PLAN FOR NEXT SESSION: ***   [place]ffield LOISE Senate, PT, DPT 04/22/2024, 8:28 AM

## 2024-04-22 NOTE — Patient Instructions (Addendum)
 Medication Instructions:  DECREASE Norvasc (Amlodipine) to 7.5mg  Take 1.5 tablet once a day (tablet comes in 5mg  you will take 1.5 tablets daily) *If you need a refill on your cardiac medications before your next appointment, please call your pharmacy*  Lab Work: None ordered If you have labs (blood work) drawn today and your tests are completely normal, you will receive your results only by: MyChart Message (if you have MyChart) OR A paper copy in the mail If you have any lab test that is abnormal or we need to change your treatment, we will call you to review the results.  Testing/Procedures: None ordered  Follow-Up: At [facility], you and your health needs are our priority.  As part of our continuing mission to provide you with exceptional heart care, our providers are all part of one team.  This team includes your primary Cardiologist (physician) and Advanced Practice Providers or APPs (Physician Assistants and Nurse Practitioners) who all work together to provide you with the care you need, when you need it.  Your next appointment:   2-3 month(s)  Provider:   Newman JINNY Lawrence, MD or Josefa Beauvais, NP  We recommend signing up for the patient portal called MyChart.  Sign up information is provided on this After Visit Summary.  MyChart is used to connect with patients for Virtual Visits (Telemedicine).  Patients are able to view lab/test results, encounter notes, upcoming appointments, etc.  Non-urgent messages can be sent to your provider as well.   To learn more about what you can do with MyChart, go to [email or web address].   Other Instructions Exercise regularly as told by your doctor. Make sure to weight daily and keep a weight log.   Moderate-intensity exercise is any activity that gets you moving enough to burn at least three times more energy (calories) than if you were sitting. Examples of moderate exercise include: Walking a mile in 15 minutes. Doing  light yard work. Biking at an easy pace. Most people should get at least 150 minutes of moderate-intensity exercise a week to maintain their body weight.  Increase your water intake: Maintain hydration

## 2024-04-23 ENCOUNTER — Ambulatory Visit: Payer: Medicare (Managed Care) | Admitting: Physical Therapy

## 2024-04-26 NOTE — H&P (Signed)
 Cardiology Admission History and Physical   Patient ID: Dylan Olsen MRN: [id number]; DOB: [birth year]   Admission date: 04/09/2024  PCP:  [name], MD   [facility] Providers Cardiologist:  Newman JINNY Lawrence, MD  Electrophysiologist:  OLE ONEIDA HOLTS, MD      Chief Complaint:  Nausea and vomiting    History of Present Illness: Mr. Dylan Olsen is a 68 year old male with a history of atrial fibrillation status post ablation on Eliquis  who was seen in outpatient setting due to chest discomfort.  He is on 2 antianginal agents.  He was referred for coronary CTA which demonstrated high-grade LAD disease.  Earlier today he underwent complex PCI of the LAD.  He was loaded with Plavix but due to intractable nausea and vomiting he was therefore admitted and bridged with cangrelor.   Past Medical History:  Diagnosis Date   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 04/2020 following Afib ablation.   Persistent atrial fibrillation (HCC)    a. 04/2020 s/p PVI.   Primary hypertension 02/06/2021   Past Surgical History:  Procedure Laterality Date   ATRIAL FIBRILLATION ABLATION N/A 04/07/2020   Procedure: ATRIAL FIBRILLATION ABLATION;  Surgeon: HOLTS OLE ONEIDA, MD;  [facility];  Service: Cardiovascular;  Laterality: N/A;   BACK SURGERY     BUBBLE STUDY  04/06/2020   Procedure: BUBBLE STUDY;  Surgeon: Santo Stanly LABOR, MD;  [facility];  Service: Cardiovascular;;   CORONARY IMAGING/OCT N/A 04/09/2024   Procedure: CORONARY IMAGING/OCT;  Surgeon: Wendel Lurena POUR, MD;  [facility];  Service: Cardiovascular;  Laterality: N/A;   CORONARY LITHOTRIPSY N/A 04/09/2024   Procedure: CORONARY LITHOTRIPSY;  Surgeon: Wendel Lurena POUR, MD;  [facility];  Service: Cardiovascular;  Laterality: N/A;    CORONARY STENT INTERVENTION N/A 04/09/2024   Procedure: CORONARY STENT INTERVENTION;  Surgeon: Wendel Lurena POUR, MD;  [facility];  Service: Cardiovascular;  Laterality: N/A;   FOOT SURGERY     KNEE SURGERY Left    LEFT HEART CATH AND CORONARY ANGIOGRAPHY N/A 04/09/2024   Procedure: LEFT HEART CATH AND CORONARY ANGIOGRAPHY;  Surgeon: Wendel Lurena POUR, MD;  [facility];  Service: Cardiovascular;  Laterality: N/A;   TEE WITHOUT CARDIOVERSION N/A 04/06/2020   Procedure: TRANSESOPHAGEAL ECHOCARDIOGRAM (TEE);  Surgeon: Santo Stanly LABOR, MD;  Location: [facility] ENDOSCOPY;  Service: Cardiovascular;  Laterality: N/A;     Medications Prior to Admission: Prior to Admission medications   Medication Sig Start Date End Date Taking? Authorizing Provider  acetaminophen  (TYLENOL ) 500 MG tablet Take 500 mg by mouth every 6 (six) hours as needed (pain).   Yes [provider]  apixaban  (ELIQUIS ) 5 MG TABS tablet Take 1 tablet (5 mg total) by mouth 2 (two) times daily. 12/23/23  Yes Fenton, Clint R, PA  atorvastatin  (LIPITOR) 20 MG tablet TAKE 1 TABLET BY MOUTH EVERY DAY 06/16/23  Yes Copland, Mirza, MD  cetirizine (ZYRTEC) 10 MG tablet Take 10 mg by mouth at bedtime.   Yes [provider]  Cholecalciferol (VITAMIN D3 PO) Take 2,000 Units by mouth in the morning.   Yes [provider]  Misc Natural Products (BEET ROOT PO) Take 2 each by mouth in the morning. Beet Chews   Yes [provider]  nitroGLYCERIN  (NITROSTAT ) 0.4 MG  SL tablet Place 1 tablet (0.4 mg total) under the tongue every 5 (five) minutes as needed for chest pain. 03/26/24  Yes Lesia Ozell Barter, PA-C  pantoprazole  (PROTONIX ) 40 MG tablet Take 1 tablet (40 mg total) by mouth 2 (two) times daily. 04/13/24 04/13/25 Yes Patwardhan, Manish J, MD  amLODipine (NORVASC) 5 MG tablet Take 1.5 tablets (7.5 mg total) by mouth daily. 04/22/24   Emelia Josefa HERO, NP  clopidogrel (PLAVIX) 75 MG  tablet Take 1 tablet (75 mg total) by mouth daily. 04/14/24   Patwardhan, Newman PARAS, MD  FLUoxetine  (PROZAC ) 20 MG capsule TAKE 1 CAPSULE BY MOUTH EVERY DAY 04/19/24   Copland, Jacques, MD  lisinopril  (ZESTRIL ) 40 MG tablet TAKE 1 TABLET BY MOUTH EVERY DAY 04/20/24   Cindie Ole DASEN, MD  meclizine  (ANTIVERT ) 25 MG tablet Take 1 tablet (25 mg total) by mouth 3 (three) times daily as needed for dizziness. 04/13/24   Patwardhan, Newman PARAS, MD     Allergies:    Allergies  Allergen Reactions   Other Other (See Comments)    Pt reports he coded after having anesthesia with a back surgery  Can not recall what medicine    Social History:   Social History   Socioeconomic History   Marital status: Married    Spouse name: [name]   Number of children: 2   Years of education: high school   Highest education level: Not on file  Occupational History   Occupation: Curator  Tobacco Use   Smoking status: Some Days    Types: Cigars   Smokeless tobacco: Never   Tobacco comments:    a few times a year  Vaping Use   Vaping status: Never Used  Substance and Sexual Activity   Alcohol use: Yes    Comment: less than monthly   Drug use: Yes    Types: Marijuana    Comment: weekend   Sexual activity: Yes    Birth control/protection: Post-menopausal  Other Topics Concern   Not on file  Social History Narrative   01/13/20   From: [place], [place] originally - moved to [place] 1996   Living: with wife [name] (1996), and step daughter (special needs)   Work: English as a second language teacher for [employer]      Family: 2 children - Gustav and Navarre - in , good relationship and 3 grandchildren and 2 step children      Enjoys: drag racing, football      Exercise: not currently - walking and mobile job   Diet: not great, sometimes a little better      Safety   Seat belts: Yes    Guns: Yes  and secure   Safe in relationships: Yes    Social Drivers of Corporate investment banker Strain: Not on file  Food  Insecurity: No Food Insecurity (04/09/2024)   Hunger Vital Sign    Worried About Running Out of Food in the Last Year: Never true    Ran Out of Food in the Last Year: Never true  Transportation Needs: No Transportation Needs (04/09/2024)   PRAPARE - Administrator, Civil Service (Medical): No    Lack of Transportation (Non-Medical): No  Physical Activity: Not on file  Stress: Not on file  Social Connections: Socially Integrated (04/09/2024)   Social Connection and Isolation Panel    Frequency of Communication with Friends and Family: Twice a week    Frequency of Social Gatherings with Friends and Family: Once a week  Attends Religious Services: 1 to 4 times per year    Active Member of Clubs or Organizations: Yes    Attends Banker Meetings: 1 to 4 times per year    Marital Status: Married  Catering manager Violence: Not At Risk (04/09/2024)   Humiliation, Afraid, Rape, and Kick questionnaire    Fear of Current or Ex-Partner: No    Emotionally Abused: No    Physically Abused: No    Sexually Abused: No     Family History:   The patient's family history includes Cancer in his cousin; Heart attack (age of onset: 70) in his father; Heart disease in his father. There is no history of Stomach cancer, Rectal cancer, Kidney disease, Colon cancer, or Colon polyps.    ROS:  Please see the history of present illness.  All other ROS reviewed and negative.     Physical Exam/Data: Vitals:   04/12/24 1500 04/12/24 2128 04/13/24 0629 04/13/24 0806  BP: (!) 174/82 (!) 148/82 (!) 155/89 (!) 156/91  Pulse: 69 62 (!) 55 (!) 58  Resp: 15 16 16 19   Temp: 98.1 F (36.7 C) 98.9 F (37.2 C) 98.8 F (37.1 C) 97.9 F (36.6 C)  TempSrc: Oral Oral Oral Oral  SpO2: 97% 93% 95% 95%  Weight:      Height:       No intake or output data in the 24 hours ending 04/26/24 1747    04/22/2024    2:14 PM 04/09/2024    7:15 PM 04/09/2024    7:58 AM  Last 3 Weights  Weight (lbs) 181  lb 193 lb 9 oz 192 lb  Weight (kg) 82.101 kg 87.8 kg 87.091 kg     Body mass index is 27 kg/m.  General:  Well nourished, well developed, nauseated HEENT: normal Neck: no JVD Vascular: No carotid bruits; Distal pulses 2+ bilaterally   Cardiac:  normal S1, S2; RRR; no murmur  Lungs:  clear to auscultation bilaterally, no wheezing, rhonchi or rales  Abd: soft, nontender, no hepatomegaly  Ext: no edema Musculoskeletal:  No deformities, BUE and BLE strength normal and equal Skin: warm and dry  Neuro:  CNs 2-12 intact, no focal abnormalities noted Psych:  Normal affect   EKG:  The ECG that was done today after PCI was personally reviewed and demonstrates sinus rhythm with incomplete right bundle branch block  Relevant CV Studies: 1.  Complex PCI of high-grade mid LAD lesion treated with shockwave lithotripsy and one 3.0 x 32 mm Synergy XD stent postdilated with a 3.25 mm  balloon.  Due to small dissection at the distal edge of the stent this was overlapped with 2 additional stents a 2.5 x 16 mm Synergy XD stent and a 2.25 x 8 mm Synergy XD stent.   2.  Mild left circumflex and RCA disease   Summary: Due to ongoing nausea and vomiting and questionable absorption of Plavix that was administered, the patient will be treated with cangrelor infusion.  Plan on Plavix load in the morning and resuming Eliquis  at that time.  Plan on 2 weeks of triple therapy with Plavix, aspirin and Eliquis  then Plavix and Eliquis  for 6 months then Eliquis  monotherapy thereafter.  Overnight observation with telemetry.  Laboratory Data: High Sensitivity Troponin:  No results for input(s): TROPONINIHS in the last 720 hours.    ChemistryNo results for input(s): NA, K, CL, CO2, GLUCOSE, BUN, CREATININE, CALCIUM , MG, GFRNONAA, GFRAA, ANIONGAP in the last 168 hours.  No results for  input(s): PROT, ALBUMIN, AST, ALT, ALKPHOS, BILITOT in the last 168 hours. Lipids No results for  input(s): CHOL, TRIG, HDL, LABVLDL, LDLCALC, CHOLHDL in the last 168 hours. HematologyNo results for input(s): WBC, RBC, HGB, HCT, MCV, MCH, MCHC, RDW, PLT in the last 168 hours. Thyroid  No results for input(s): TSH, FREET4 in the last 168 hours. BNPNo results for input(s): BNP, PROBNP in the last 168 hours.  DDimer No results for input(s): DDIMER in the last 168 hours.  Radiology/Studies:  No results found.   Assessment and Plan: Angina pectoris: Status post PCI of mid LAD with shockwave lithotripsy and 3 drug-eluting stents.  Due to nausea and vomiting we will bridge with cangrelor and restart Eliquis . Nausea and vomiting: Continue Zofran  and ondansetron .  Risk Assessment/Risk Scores:        Code Status: Full Code  Severity of Illness: The appropriate patient status for this patient is INPATIENT. Inpatient status is judged to be reasonable and necessary in order to provide the required intensity of service to ensure the patient's safety. The patient's presenting symptoms, physical exam findings, and initial radiographic and laboratory data in the context of their chronic comorbidities is felt to place them at high risk for further clinical deterioration. Furthermore, it is not anticipated that the patient will be medically stable for discharge from the hospital within 2 midnights of admission.   * I certify that at the point of admission it is my clinical judgment that the patient will require inpatient hospital care spanning beyond 2 midnights from the point of admission due to high intensity of service, high risk for further deterioration and high frequency of surveillance required.*  For questions or updates, please contact [facility] Please consult [email or web address] for contact info under       Signed, Satina Jerrell K Emersynn Deatley, MD  04/26/2024 5:47 PM

## 2024-04-27 ENCOUNTER — Telehealth (HOSPITAL_COMMUNITY): Payer: Self-pay

## 2024-04-27 NOTE — Telephone Encounter (Signed)
 Emelia Josefa HERO, NP to Darrell Bruckner, RPH  Herschel Allean CROME, RPH-CPP  Cv Div Pharmd     04/27/24  8:23 AM Hello team, I recently saw this patient in clinic.  He is having issues with blood pressure.  Would someone be able to call him to review his medications and offer support, or bring him into the clinic for visit/review.  Thank you for your help.   Josefa HERO. Cleaver NP-C      04/27/2024, 8:22 AM [facility] [address] [place], [place] [address] Office [phone]      Called pt to offer an office visit. Pt defers for now stating that he needs to know his wife's schedule before making an appointment. Pt will send a mychart message when he is ready to schedule.

## 2024-04-27 NOTE — Telephone Encounter (Signed)
 Attempted to call patient to confirm interest in cardiac rehab- no answer, unable to leave message. Sent MyChart message.

## 2024-05-02 DIAGNOSIS — Z955 Presence of coronary angioplasty implant and graft: Secondary | ICD-10-CM | POA: Insufficient documentation

## 2024-05-02 DIAGNOSIS — I2510 Atherosclerotic heart disease of native coronary artery without angina pectoris: Secondary | ICD-10-CM | POA: Insufficient documentation

## 2024-05-02 DIAGNOSIS — I350 Nonrheumatic aortic (valve) stenosis: Secondary | ICD-10-CM | POA: Insufficient documentation

## 2024-05-02 NOTE — Progress Notes (Deleted)
 Michaline Kindig T. Shanyce Daris, MD, CAQ Sports Medicine [facility] [address] [place], [address]  Phone: [phone]  FAX: [phone]  Dylan Olsen - 68 y.o. male  MRN [id number]  Date of Birth: Jul 20, 1955  Date: 05/05/2024  PCP: [name], MD  Referral: [name], MD  No chief complaint on file.  Subjective:   Dylan Olsen is a 68 y.o. very pleasant male patient with There is no height or weight on file to calculate BMI. who presents with the following:  Discussed the use of AI scribe software for clinical note transcription with the patient, who gave verbal consent to proceed.  [name] is here for 6-week follow-up.  I last saw him on March 24, 2024.  At that point, I was worried about his ongoing chest pain and referred him to cardiology.  When he saw them, they ultimately did a left-sided heart catheterization and stent placement after doing a prior to prior CT coronary morphology. - They found a 95% stenosed mid LAD lesion, 20% stenosed RCA and placed a stent in the LAD.  Cardiology placed him on triple therapy with Plavix, aspirin, and Eliquis  for 2 weeks followed by Plavix and Eliquis  for 6 months, then Eliquis  monotherapy. -Continue atorvastatin , Plavix, Eliquis , amlodipine with a low-salt diet History of Present Illness     Review of Systems is noted in the HPI, as appropriate  Objective:   There were no vitals taken for this visit.  GEN: No acute distress; alert,appropriate. PULM: Breathing comfortably in no respiratory distress PSYCH: Normally interactive.   Laboratory and Imaging Data:  Assessment and Plan:   No diagnosis found. Assessment & Plan   Medication Management during today's office visit: No orders of the defined types were placed in this encounter.  There are no discontinued medications.  Orders placed today for conditions managed today: No orders of the defined types were  placed in this encounter.   Disposition: No follow-ups on file.  Dragon [employer] One speech-to-text software was used for transcription in this dictation.  Possible transcriptional errors can occur using Animal nutritionist.   Signed,  Mirza DASEN. Jaycub Noorani, MD   Outpatient Encounter Medications as of 05/05/2024  Medication Sig   acetaminophen  (TYLENOL ) 500 MG tablet Take 500 mg by mouth every 6 (six) hours as needed (pain).   amLODipine (NORVASC) 5 MG tablet Take 1.5 tablets (7.5 mg total) by mouth daily.   apixaban  (ELIQUIS ) 5 MG TABS tablet Take 1 tablet (5 mg total) by mouth 2 (two) times daily.   atorvastatin  (LIPITOR) 20 MG tablet TAKE 1 TABLET BY MOUTH EVERY DAY   cetirizine (ZYRTEC) 10 MG tablet Take 10 mg by mouth at bedtime.   Cholecalciferol (VITAMIN D3 PO) Take 2,000 Units by mouth in the morning.   clopidogrel (PLAVIX) 75 MG tablet Take 1 tablet (75 mg total) by mouth daily.   FLUoxetine  (PROZAC ) 20 MG capsule TAKE 1 CAPSULE BY MOUTH EVERY DAY   lisinopril  (ZESTRIL ) 40 MG tablet TAKE 1 TABLET BY MOUTH EVERY DAY   meclizine  (ANTIVERT ) 25 MG tablet Take 1 tablet (25 mg total) by mouth 3 (three) times daily as needed for dizziness.   Misc Natural Products (BEET ROOT PO) Take 2 each by mouth in the morning. Beet Chews   nitroGLYCERIN  (NITROSTAT ) 0.4 MG SL tablet Place 1 tablet (0.4 mg total) under the tongue every 5 (five) minutes as needed for chest pain.   pantoprazole  (PROTONIX ) 40  MG tablet Take 1 tablet (40 mg total) by mouth 2 (two) times daily.   No facility-administered encounter medications on file as of 05/05/2024.

## 2024-05-04 ENCOUNTER — Ambulatory Visit (HOSPITAL_COMMUNITY): Payer: Medicare (Managed Care)

## 2024-05-05 ENCOUNTER — Ambulatory Visit: Payer: Medicare (Managed Care) | Admitting: Family Medicine

## 2024-05-05 DIAGNOSIS — Z955 Presence of coronary angioplasty implant and graft: Secondary | ICD-10-CM

## 2024-05-05 DIAGNOSIS — I2510 Atherosclerotic heart disease of native coronary artery without angina pectoris: Secondary | ICD-10-CM

## 2024-05-05 DIAGNOSIS — I10 Essential (primary) hypertension: Secondary | ICD-10-CM

## 2024-05-05 DIAGNOSIS — I4819 Other persistent atrial fibrillation: Secondary | ICD-10-CM

## 2024-05-05 DIAGNOSIS — E782 Mixed hyperlipidemia: Secondary | ICD-10-CM

## 2024-05-05 DIAGNOSIS — I350 Nonrheumatic aortic (valve) stenosis: Secondary | ICD-10-CM

## 2024-05-06 ENCOUNTER — Ambulatory Visit: Payer: Self-pay | Admitting: Cardiology

## 2024-05-06 ENCOUNTER — Ambulatory Visit (INDEPENDENT_AMBULATORY_CARE_PROVIDER_SITE_OTHER): Payer: Medicare (Managed Care)

## 2024-05-06 DIAGNOSIS — I2510 Atherosclerotic heart disease of native coronary artery without angina pectoris: Secondary | ICD-10-CM

## 2024-05-06 LAB — CUP PACEART REMOTE DEVICE CHECK
Date Time Interrogation Session: 20251029231029
Implantable Pulse Generator Implant Date: 20220316

## 2024-05-11 ENCOUNTER — Ambulatory Visit: Payer: Medicare (Managed Care) | Admitting: Student

## 2024-05-11 ENCOUNTER — Telehealth (HOSPITAL_COMMUNITY): Payer: Self-pay

## 2024-05-11 NOTE — Telephone Encounter (Signed)
 Attempted f/u regarding cardiac rehab- no answer, left message.   Closing referral.

## 2024-05-11 NOTE — Progress Notes (Signed)
 Remote Loop Recorder Transmission

## 2024-05-11 NOTE — Progress Notes (Unsigned)
 Dylan Olsen T. Dylan Thorup, MD, CAQ Sports Medicine [facility] [address] [place], [address]  Phone: [phone]  FAX: [phone]  Duey Olsen - 68 y.o. male  MRN [id number]  Date of Birth: 1956/05/07  Date: 05/12/2024  PCP: [name], MD  Referral: [name], MD  No chief complaint on file.  Subjective:   Dylan Olsen is a 68 y.o. very pleasant male patient with There is no height or weight on file to calculate BMI. who presents with the following:  Discussed the use of AI scribe software for clinical note transcription with the patient, who gave verbal consent to proceed.  [name] is here for 6-week follow-up.  I last saw him on March 24, 2024.  At that point, I was worried about his ongoing chest pain and referred him to cardiology.  When he saw them, they ultimately did a left-sided heart catheterization and stent placement after doing a prior to prior CT coronary morphology. - They found a 95% stenosed mid LAD lesion, 20% stenosed RCA and placed a stent in the LAD.  Cardiology placed him on triple therapy with Plavix, aspirin, and Eliquis  for 2 weeks followed by Plavix and Eliquis  for 6 months, then Eliquis  monotherapy. -Continue atorvastatin , Plavix, Eliquis , amlodipine with a low-salt diet  He is also following up with me today for some chronic depression. History of Present Illness     Review of Systems is noted in the HPI, as appropriate  Objective:   There were no vitals taken for this visit.  GEN: No acute distress; alert,appropriate. PULM: Breathing comfortably in no respiratory distress PSYCH: Normally interactive.   Laboratory and Imaging Data:  Assessment and Plan:   No diagnosis found. Assessment & Plan   Medication Management during today's office visit: No orders of the defined types were placed in this encounter.  There are no discontinued medications.  Orders placed today  for conditions managed today: No orders of the defined types were placed in this encounter.   Disposition: No follow-ups on file.  Dragon [employer] One speech-to-text software was used for transcription in this dictation.  Possible transcriptional errors can occur using Animal nutritionist.   Signed,  Dylan Olsen. Dylan Mcadam, MD   Outpatient Encounter Medications as of 05/12/2024  Medication Sig   acetaminophen  (TYLENOL ) 500 MG tablet Take 500 mg by mouth every 6 (six) hours as needed (pain).   amLODipine (NORVASC) 5 MG tablet Take 1.5 tablets (7.5 mg total) by mouth daily.   apixaban  (ELIQUIS ) 5 MG TABS tablet Take 1 tablet (5 mg total) by mouth 2 (two) times daily.   atorvastatin  (LIPITOR) 20 MG tablet TAKE 1 TABLET BY MOUTH EVERY DAY   cetirizine (ZYRTEC) 10 MG tablet Take 10 mg by mouth at bedtime.   Cholecalciferol (VITAMIN D3 PO) Take 2,000 Units by mouth in the morning.   clopidogrel (PLAVIX) 75 MG tablet Take 1 tablet (75 mg total) by mouth daily.   FLUoxetine  (PROZAC ) 20 MG capsule TAKE 1 CAPSULE BY MOUTH EVERY DAY   lisinopril  (ZESTRIL ) 40 MG tablet TAKE 1 TABLET BY MOUTH EVERY DAY   meclizine  (ANTIVERT ) 25 MG tablet Take 1 tablet (25 mg total) by mouth 3 (three) times daily as needed for dizziness.   Misc Natural Products (BEET ROOT PO) Take 2 each by mouth in the morning. Beet Chews   nitroGLYCERIN  (NITROSTAT ) 0.4 MG SL tablet Place 1 tablet (0.4 mg total) under the tongue every  5 (five) minutes as needed for chest pain.   pantoprazole  (PROTONIX ) 40 MG tablet Take 1 tablet (40 mg total) by mouth 2 (two) times daily.   No facility-administered encounter medications on file as of 05/12/2024.

## 2024-05-12 ENCOUNTER — Ambulatory Visit: Payer: Medicare (Managed Care) | Admitting: Family Medicine

## 2024-05-12 ENCOUNTER — Ambulatory Visit: Payer: Medicare (Managed Care) | Attending: Internal Medicine

## 2024-05-12 ENCOUNTER — Encounter: Payer: Self-pay | Admitting: Family Medicine

## 2024-05-12 VITALS — BP 158/84 | HR 110 | Temp 98.0°F | Ht 70.0 in | Wt 190.4 lb

## 2024-05-12 DIAGNOSIS — Z8673 Personal history of transient ischemic attack (TIA), and cerebral infarction without residual deficits: Secondary | ICD-10-CM

## 2024-05-12 DIAGNOSIS — I639 Cerebral infarction, unspecified: Secondary | ICD-10-CM

## 2024-05-12 DIAGNOSIS — E782 Mixed hyperlipidemia: Secondary | ICD-10-CM | POA: Diagnosis not present

## 2024-05-12 DIAGNOSIS — F331 Major depressive disorder, recurrent, moderate: Secondary | ICD-10-CM

## 2024-05-12 DIAGNOSIS — I2510 Atherosclerotic heart disease of native coronary artery without angina pectoris: Secondary | ICD-10-CM | POA: Diagnosis not present

## 2024-05-12 DIAGNOSIS — Z955 Presence of coronary angioplasty implant and graft: Secondary | ICD-10-CM

## 2024-05-12 DIAGNOSIS — I10 Essential (primary) hypertension: Secondary | ICD-10-CM

## 2024-05-12 NOTE — Patient Instructions (Addendum)
 Restart lisinopril  40 mg  BP less than 120/80

## 2024-05-13 ENCOUNTER — Encounter: Payer: Self-pay | Admitting: Family Medicine

## 2024-05-13 ENCOUNTER — Telehealth: Payer: Self-pay | Admitting: Family Medicine

## 2024-05-13 DIAGNOSIS — I639 Cerebral infarction, unspecified: Secondary | ICD-10-CM | POA: Insufficient documentation

## 2024-05-13 DIAGNOSIS — Z8673 Personal history of transient ischemic attack (TIA), and cerebral infarction without residual deficits: Secondary | ICD-10-CM | POA: Insufficient documentation

## 2024-05-13 NOTE — Telephone Encounter (Signed)
 Can you let Carlie know that Cardiology also wanted him to restart the lisinopril , but only to restart that one and not the amlodipine.

## 2024-05-13 NOTE — Telephone Encounter (Signed)
-----   Message from Josefa CHRISTELLA Beauvais sent at 05/13/2024  9:31 AM EST ----- Regarding: RE: Stopped medications Yes, agree with restarting lisinopril .  Thanks for your help.  Josefa CHRISTELLA. Cleaver NP-C  [image]   05/13/2024, 9:32 AM [facility] [address] [place], [place] [address] Office [phone] ----- Message ----- From: [name], MD Sent: 05/13/2024   9:04 AM EST To: Josefa CHRISTELLA Beauvais, NP; Ole ONEIDA Holts, MD Subject: Stopped medications                            I was not entirely sure who in cardiology I should follow-up with this about.  Saw Edwyn in the office today, he recently had PCI of the LAD, and he and his wife have stopped his lisinopril  and amlodipine.  I restarted him on lisinopril  40 mg.  I wanted to reach out to cardiology to make sure that this is how you would like this handled.  He is taking his antiplatelet medication.  Thank you.

## 2024-05-14 ENCOUNTER — Other Ambulatory Visit (HOSPITAL_COMMUNITY): Payer: Self-pay

## 2024-05-14 NOTE — Telephone Encounter (Signed)
 Spoke with pt and he is aware of Dr. Eleanore message.

## 2024-06-06 ENCOUNTER — Ambulatory Visit: Payer: Medicare (Managed Care)

## 2024-06-09 ENCOUNTER — Ambulatory Visit: Payer: Medicare (Managed Care) | Attending: Cardiology

## 2024-06-09 DIAGNOSIS — I4819 Other persistent atrial fibrillation: Secondary | ICD-10-CM | POA: Diagnosis not present

## 2024-06-10 LAB — CUP PACEART REMOTE DEVICE CHECK
Date Time Interrogation Session: 20251202230334
Implantable Pulse Generator Implant Date: 20220316

## 2024-06-13 ENCOUNTER — Ambulatory Visit: Payer: Self-pay | Admitting: Cardiology

## 2024-06-16 NOTE — Progress Notes (Signed)
 Remote Loop Recorder Transmission

## 2024-06-30 ENCOUNTER — Other Ambulatory Visit (HOSPITAL_COMMUNITY): Payer: Self-pay

## 2024-07-07 ENCOUNTER — Ambulatory Visit: Payer: Medicare (Managed Care)

## 2024-07-10 ENCOUNTER — Ambulatory Visit: Payer: Medicare (Managed Care) | Attending: Cardiology

## 2024-07-10 DIAGNOSIS — I4819 Other persistent atrial fibrillation: Secondary | ICD-10-CM

## 2024-07-12 LAB — CUP PACEART REMOTE DEVICE CHECK
Date Time Interrogation Session: 20260102231331
Implantable Pulse Generator Implant Date: 20220316

## 2024-07-14 ENCOUNTER — Other Ambulatory Visit: Payer: Self-pay | Admitting: Cardiology

## 2024-07-14 ENCOUNTER — Other Ambulatory Visit (HOSPITAL_COMMUNITY): Payer: Self-pay

## 2024-07-15 NOTE — Progress Notes (Signed)
 Remote Loop Recorder Transmission

## 2024-07-17 ENCOUNTER — Ambulatory Visit: Payer: Self-pay | Admitting: Cardiology

## 2024-07-18 ENCOUNTER — Other Ambulatory Visit: Payer: Self-pay | Admitting: General Practice

## 2024-07-18 ENCOUNTER — Other Ambulatory Visit: Payer: Self-pay | Admitting: Family Medicine

## 2024-07-27 ENCOUNTER — Ambulatory Visit: Payer: Medicare (Managed Care) | Attending: Cardiology | Admitting: Cardiology

## 2024-07-27 ENCOUNTER — Encounter: Payer: Self-pay | Admitting: Cardiology

## 2024-07-27 ENCOUNTER — Other Ambulatory Visit: Payer: Self-pay

## 2024-07-27 VITALS — BP 132/80 | HR 64 | Ht 71.0 in | Wt 192.0 lb

## 2024-07-27 DIAGNOSIS — E782 Mixed hyperlipidemia: Secondary | ICD-10-CM | POA: Diagnosis not present

## 2024-07-27 DIAGNOSIS — I4819 Other persistent atrial fibrillation: Secondary | ICD-10-CM

## 2024-07-27 DIAGNOSIS — I350 Nonrheumatic aortic (valve) stenosis: Secondary | ICD-10-CM

## 2024-07-27 DIAGNOSIS — I10 Essential (primary) hypertension: Secondary | ICD-10-CM | POA: Diagnosis not present

## 2024-07-27 MED ORDER — METOPROLOL SUCCINATE ER 25 MG PO TB24: 25.0000 mg | ORAL_TABLET | Freq: Every day | ORAL | 3 refills | Status: AC

## 2024-07-27 MED ORDER — APIXABAN 5 MG PO TABS: 5.0000 mg | ORAL_TABLET | Freq: Two times a day (BID) | ORAL | 3 refills | Status: AC

## 2024-07-27 MED ORDER — ATORVASTATIN CALCIUM 40 MG PO TABS: 40.0000 mg | ORAL_TABLET | Freq: Every day | ORAL | 3 refills | Status: AC

## 2024-07-27 MED ORDER — LISINOPRIL 20 MG PO TABS: 20.0000 mg | ORAL_TABLET | Freq: Every day | ORAL | 3 refills | Status: AC

## 2024-07-27 MED ORDER — CLOPIDOGREL BISULFATE 75 MG PO TABS: 75.0000 mg | ORAL_TABLET | Freq: Every day | ORAL | 3 refills | Status: AC

## 2024-07-27 NOTE — Progress Notes (Signed)
 " Cardiology Office Note:  .   Date:  07/27/2024  ID:  Dylan Olsen, DOB Mar 15, 1956, MRN [id number] PCP: Dylan Mirza, MD  [facility] Providers Cardiologist:  Dylan Lawrence, MD PCP: Dylan Mirza, MD  Chief Complaint  Patient presents with   Coronary Artery Disease     Dylan Olsen is a 69 y.o. male with hypertension, hyperlipidemia, CAD, paroxysmal A-fib, prior basal ganglia stroke, aortic stenosis, dizziness  Discussed the use of AI scribe software for clinical note transcription with the patient, who gave verbal consent to proceed.  History of Present Illness  Patient underwent complex LAD intervention in 04/2024.  Since then, his chest pain is resolved.  However, he continues to have episodes of palpitations and occasional shortness of breath.  He has known paroxysmal A-fib, and has had episodes anywhere from 2 hours to 12 hours of A-fib, noted on loop recorder, although A-fib burden remains less than 1%.  Separately, he continues to have frequent episodes of dizziness.  After his outpatient LAD PCI, he was hospitalized due to intractable nausea, dizziness, vomiting.  CT scan at that time did show old basal ganglia infarct.  He has never seen a neurologist.  Dizziness has persisted after discontinuing amlodipine .     Vitals:   07/27/24 0937  BP: 132/80  Pulse: 64  SpO2: 97%      Review of Systems  Cardiovascular:  Negative for chest pain, dyspnea on exertion, leg swelling, palpitations and syncope.  Neurological:  Positive for dizziness.        Studies Reviewed: .       Echocardiogram 04/2024:  1. No LV thrombus by Definity . Left ventricular ejection fraction, by  estimation, is >75%. The left ventricle has hyperdynamic function. The  left ventricle has no regional wall motion abnormalities. Left ventricular  diastolic parameters were normal.   2. Right ventricular systolic function is low normal. The right  ventricular size is  moderately enlarged. Tricuspid regurgitation signal is  inadequate for assessing PA pressure.   3. Right atrial size was moderately dilated.   4. The mitral valve is grossly normal. Trivial mitral valve  regurgitation. No evidence of mitral stenosis.   5. The aortic valve was not well visualized. Aortic valve regurgitation  is not visualized. Mild aortic valve stenosis. Aortic valve area, by VTI  measures 1.96 cm. Aortic valve mean gradient measures 9.0 mmHg. Aortic  valve Vmax measures 2.17 m/s.   6. The inferior vena cava is dilated in size with <50% respiratory  variability, suggesting right atrial pressure of 15 mmHg.   7. Agitated saline contrast bubble study was inconclusive due to  suboptimal image.   8. Increased flow velocities may be secondary to anemia, thyrotoxicosis,  hyperdynamic or high flow state.   Coronary intervention 04/2024:   Mid LAD lesion is 95% stenosed.   Prox RCA lesion is 20% stenosed.   A stent was successfully placed.   Post intervention, there is a 0% residual stenosis.   1.  Complex PCI of high-grade mid LAD lesion treated with shockwave lithotripsy and one 3.0 x 32 mm Synergy XD stent postdilated with a 3.25 mm [place] balloon.  Due to small dissection at the distal edge of the stent this was overlapped with 2 additional stents a 2.5 x 16 mm Synergy XD stent and a 2.25 x 8 mm Synergy XD stent.   2.  Mild left circumflex and RCA disease  Labs 04/2024: Chol 169, TG 332, HDL 38,  LDL not calculated Lp(a) 58 HbA1C 5.6% Hb 12 Cr 0.79, K 3.4 TSH 2.5  Risk Assessment/Calculations:    CHA2DS2-VASc Score = 4  This indicates a 4.8% annual risk of stroke. The patient's score is based upon: CHF History: 0 HTN History: 1 Diabetes History: 0 Stroke History: 2 Vascular Disease History: 0 Age Score: 1 Gender Score: 0      Physical Exam Vitals and nursing note reviewed.  Constitutional:      General: He is not in acute distress. Neck:     Vascular: No  JVD.  Cardiovascular:     Rate and Rhythm: Normal rate and regular rhythm.     Heart sounds: Murmur heard.     Harsh midsystolic murmur is present at the upper right sternal border radiating to the neck.  Pulmonary:     Effort: Pulmonary effort is normal.     Breath sounds: Normal breath sounds. No wheezing or rales.      VISIT DIAGNOSES:   ICD-10-CM   1. Nonrheumatic aortic valve stenosis  I35.0     2. Mixed hyperlipidemia  E78.2 atorvastatin  (LIPITOR) 40 MG tablet    Lipid panel    3. Persistent atrial fibrillation (HCC)  I48.19 lisinopril  (ZESTRIL ) 20 MG tablet    4. Primary hypertension  I10 lisinopril  (ZESTRIL ) 20 MG tablet       Dylan Olsen is a 69 y.o. male with hypertension, hyperlipidemia, CAD, paroxysmal A-fib, prior basal ganglia stroke, aortic stenosis, dizziness  Assessment & Plan  CAD: Shockwave lithotripsy and PCI to mid LAD (04/2024) Continue Plavix  at least till 10/2024. Continue Lipitor 40 mg daily. Check fasting lipid panel.   Paroxysmal A-fib: S/p A-fib ablation 2021.  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 Intermittent episodes of palpitations, likely more frequent than infrequent but long A-fib episodes noted on loop recorder. Started metoprolol  succinate 25 mg daily. Continue Eliquis  5 mg twice daily. If palpitation symptoms persist, could see EP then.  Dizziness: Likely BPPV.  Chronic basal ganglia stroke noted on recent CT imaging. Consider neurology referral.    Aortic stenosis: Mild.  Recheck echocardiogram in 04/2025, will order at next visit.  Hypertension: Controlled.  As start metoprolol  succinate 25 mg daily, reduce lisinopril  to 20 mg daily.  If SBP >140 mmHg, can go back to lisinopril  40 mg daily.        Meds ordered this encounter  Medications   apixaban  (ELIQUIS ) 5 MG TABS tablet    Sig: Take 1 tablet (5 mg total) by mouth 2 (two) times daily.     Dispense:  180 tablet    Refill:  3    Lot Number?:   [id number]    Expiration Date?:   02/04/2025    Manufacturer?:   [employer] [[id number]]    Quantity:   56   atorvastatin  (LIPITOR) 40 MG tablet    Sig: Take 1 tablet (40 mg total) by mouth daily.    Dispense:  90 tablet    Refill:  3   clopidogrel  (PLAVIX ) 75 MG tablet    Sig: Take 1 tablet (75 mg total) by mouth daily.    Dispense:  90 tablet    Refill:  3   lisinopril  (ZESTRIL ) 20 MG tablet    Sig: Take 1 tablet (20 mg total) by mouth daily.    Dispense:  90 tablet    Refill:  3   metoprolol  succinate (TOPROL  XL) 25 MG 24  hr tablet    Sig: Take 1 tablet (25 mg total) by mouth daily.    Dispense:  90 tablet    Refill:  3     F/u in 3 months  Signed, Dylan JINNY Lawrence, MD  "

## 2024-07-27 NOTE — Patient Instructions (Signed)
 Medication Instructions:  INCREASE Atorvastatin  to 40 mg daily  DECREASE Lisinopril  to 20 mg daily   START Metoprolol  XL 25 mg daily   *If you need a refill on your cardiac medications before your next appointment, please call your pharmacy*  Lab Work: Fasting lipid panel in 09/2024  If you have labs (blood work) drawn today and your tests are completely normal, you will receive your results only by: MyChart Message (if you have MyChart) OR A paper copy in the mail If you have any lab test that is abnormal or we need to change your treatment, we will call you to review the results.  Follow-Up: At [facility], you and your health needs are our priority.  As part of our continuing mission to provide you with exceptional heart care, our providers are all part of one team.  This team includes your primary Cardiologist (physician) and Advanced Practice Providers or APPs (Physician Assistants and Nurse Practitioners) who all work together to provide you with the care you need, when you need it.  Your next appointment:   3 month(s)  Provider:   Newman JINNY Lawrence, MD

## 2024-08-07 ENCOUNTER — Ambulatory Visit: Payer: Medicare (Managed Care)

## 2024-08-08 ENCOUNTER — Other Ambulatory Visit: Payer: Self-pay | Admitting: Family Medicine

## 2024-08-08 DIAGNOSIS — E782 Mixed hyperlipidemia: Secondary | ICD-10-CM

## 2024-08-10 ENCOUNTER — Ambulatory Visit: Payer: Medicare (Managed Care)

## 2024-08-10 LAB — CUP PACEART REMOTE DEVICE CHECK
Date Time Interrogation Session: 20260202230421
Implantable Pulse Generator Implant Date: 20220316

## 2024-08-12 ENCOUNTER — Telehealth: Payer: Self-pay | Admitting: Family Medicine

## 2024-08-12 DIAGNOSIS — R918 Other nonspecific abnormal finding of lung field: Secondary | ICD-10-CM

## 2024-08-12 NOTE — Telephone Encounter (Signed)
 Left message for Mr. Ten Eyck to return call to office.  Also sent a MyChart message.  If patient calls back please read Dr. Eleanore message below to find out if he is okay scheduling the follow up CT.

## 2024-08-12 NOTE — Telephone Encounter (Signed)
 Can you call?  I got a reminder that it is time to repeat his chest CT.  He had a lung nodule in the fall that Radiology recommended a follow-up chest CT to make sure that it stable and has not grown.  Is he ok to schedule that? (Non-emergent)

## 2024-08-12 NOTE — Telephone Encounter (Signed)
-----   Message from Jacques Schroeder, MD sent at 05/13/2024  9:05 AM EST ----- Regarding: f/u chest CT Pulmonary nodule follow-up.  Found on CT of the chest when he had his coronary CT.

## 2024-08-13 NOTE — Telephone Encounter (Signed)
 Ordered f/ chest CT

## 2024-08-13 NOTE — Telephone Encounter (Signed)
Ordered f/u chest CT.

## 2024-08-13 NOTE — Addendum Note (Signed)
 Addended by: [name] on: 08/13/2024 03:56 PM   Modules accepted: Orders

## 2024-08-13 NOTE — Telephone Encounter (Signed)
 Spoke with Darina. He is okay scheduling the follow up CT.

## 2024-09-10 ENCOUNTER — Ambulatory Visit

## 2024-10-11 ENCOUNTER — Ambulatory Visit

## 2024-10-27 ENCOUNTER — Ambulatory Visit: Admitting: Cardiology

## 2024-11-11 ENCOUNTER — Ambulatory Visit

## 2024-12-12 ENCOUNTER — Ambulatory Visit

## 2025-01-12 ENCOUNTER — Ambulatory Visit

## 2025-02-12 ENCOUNTER — Ambulatory Visit

## 2025-03-15 ENCOUNTER — Ambulatory Visit

## 2025-04-15 ENCOUNTER — Ambulatory Visit

## 2025-05-16 ENCOUNTER — Ambulatory Visit

## 2025-06-16 ENCOUNTER — Ambulatory Visit

## 2025-07-17 ENCOUNTER — Ambulatory Visit
